# Patient Record
Sex: Male | Born: 1955 | Race: Black or African American | Hispanic: No | Marital: Single | Smoking: Current every day smoker
Health system: Southern US, Community
[De-identification: ages and names within clinical notes are randomized; demographics above are authoritative.]

## PROBLEM LIST (undated history)

## (undated) DIAGNOSIS — C679 Malignant neoplasm of bladder, unspecified: Secondary | ICD-10-CM

## (undated) DIAGNOSIS — G8929 Other chronic pain: Secondary | ICD-10-CM

## (undated) DIAGNOSIS — K2950 Unspecified chronic gastritis without bleeding: Secondary | ICD-10-CM

## (undated) DIAGNOSIS — Z86010 Personal history of colonic polyps: Secondary | ICD-10-CM

## (undated) DIAGNOSIS — M1990 Unspecified osteoarthritis, unspecified site: Secondary | ICD-10-CM

## (undated) DIAGNOSIS — Z973 Presence of spectacles and contact lenses: Secondary | ICD-10-CM

## (undated) DIAGNOSIS — Z1589 Genetic susceptibility to other disease: Secondary | ICD-10-CM

## (undated) DIAGNOSIS — M545 Low back pain, unspecified: Secondary | ICD-10-CM

## (undated) DIAGNOSIS — E739 Lactose intolerance, unspecified: Secondary | ICD-10-CM

## (undated) DIAGNOSIS — I10 Essential (primary) hypertension: Secondary | ICD-10-CM

## (undated) DIAGNOSIS — R300 Dysuria: Secondary | ICD-10-CM

## (undated) DIAGNOSIS — Z85038 Personal history of other malignant neoplasm of large intestine: Secondary | ICD-10-CM

## (undated) DIAGNOSIS — Z860101 Personal history of adenomatous and serrated colon polyps: Secondary | ICD-10-CM

## (undated) DIAGNOSIS — R399 Unspecified symptoms and signs involving the genitourinary system: Secondary | ICD-10-CM

## (undated) DIAGNOSIS — K219 Gastro-esophageal reflux disease without esophagitis: Secondary | ICD-10-CM

## (undated) DIAGNOSIS — K5790 Diverticulosis of intestine, part unspecified, without perforation or abscess without bleeding: Secondary | ICD-10-CM

## (undated) DIAGNOSIS — R0602 Shortness of breath: Secondary | ICD-10-CM

## (undated) DIAGNOSIS — Z8551 Personal history of malignant neoplasm of bladder: Secondary | ICD-10-CM

## (undated) DIAGNOSIS — F419 Anxiety disorder, unspecified: Secondary | ICD-10-CM

## (undated) DIAGNOSIS — Z1509 Genetic susceptibility to other malignant neoplasm: Secondary | ICD-10-CM

## (undated) DIAGNOSIS — Z8719 Personal history of other diseases of the digestive system: Secondary | ICD-10-CM

## (undated) DIAGNOSIS — F329 Major depressive disorder, single episode, unspecified: Secondary | ICD-10-CM

## (undated) DIAGNOSIS — C189 Malignant neoplasm of colon, unspecified: Secondary | ICD-10-CM

## (undated) DIAGNOSIS — F32A Depression, unspecified: Secondary | ICD-10-CM

## (undated) DIAGNOSIS — J45909 Unspecified asthma, uncomplicated: Secondary | ICD-10-CM

## (undated) DIAGNOSIS — T7840XA Allergy, unspecified, initial encounter: Secondary | ICD-10-CM

## (undated) DIAGNOSIS — J449 Chronic obstructive pulmonary disease, unspecified: Secondary | ICD-10-CM

## (undated) HISTORY — DX: Diverticulosis of intestine, part unspecified, without perforation or abscess without bleeding: K57.90

## (undated) HISTORY — DX: Depression, unspecified: F32.A

## (undated) HISTORY — DX: Genetic susceptibility to other malignant neoplasm: Z15.09

## (undated) HISTORY — DX: Gastro-esophageal reflux disease without esophagitis: K21.9

## (undated) HISTORY — DX: Anxiety disorder, unspecified: F41.9

## (undated) HISTORY — DX: Malignant neoplasm of colon, unspecified: C18.9

## (undated) HISTORY — DX: Essential (primary) hypertension: I10

## (undated) HISTORY — PX: COLONOSCOPY WITH ESOPHAGOGASTRODUODENOSCOPY (EGD): SHX5779

## (undated) HISTORY — DX: Major depressive disorder, single episode, unspecified: F32.9

## (undated) HISTORY — DX: Chronic obstructive pulmonary disease, unspecified: J44.9

## (undated) HISTORY — DX: Genetic susceptibility to other disease: Z15.89

## (undated) HISTORY — DX: Allergy, unspecified, initial encounter: T78.40XA

## (undated) HISTORY — PX: TONSILLECTOMY: SUR1361

## (undated) HISTORY — PX: LUMBAR DISC SURGERY: SHX700

---

## 1997-07-16 ENCOUNTER — Encounter: Admission: RE | Admit: 1997-07-16 | Discharge: 1997-10-14 | Payer: Self-pay | Admitting: Neurosurgery

## 1997-09-26 ENCOUNTER — Ambulatory Visit (HOSPITAL_COMMUNITY): Admission: RE | Admit: 1997-09-26 | Discharge: 1997-09-26 | Payer: Self-pay | Admitting: Neurosurgery

## 1997-10-25 ENCOUNTER — Ambulatory Visit (HOSPITAL_COMMUNITY): Admission: RE | Admit: 1997-10-25 | Discharge: 1997-10-25 | Payer: Self-pay | Admitting: Neurosurgery

## 1997-12-03 ENCOUNTER — Ambulatory Visit (HOSPITAL_COMMUNITY): Admission: RE | Admit: 1997-12-03 | Discharge: 1997-12-03 | Payer: Self-pay | Admitting: Neurosurgery

## 1997-12-03 ENCOUNTER — Encounter: Payer: Self-pay | Admitting: Neurosurgery

## 1997-12-20 ENCOUNTER — Inpatient Hospital Stay (HOSPITAL_COMMUNITY): Admission: RE | Admit: 1997-12-20 | Discharge: 1997-12-26 | Payer: Self-pay | Admitting: Neurosurgery

## 1997-12-20 ENCOUNTER — Encounter: Payer: Self-pay | Admitting: Neurosurgery

## 1997-12-23 ENCOUNTER — Encounter: Payer: Self-pay | Admitting: Neurosurgery

## 1998-01-04 ENCOUNTER — Emergency Department (HOSPITAL_COMMUNITY): Admission: EM | Admit: 1998-01-04 | Discharge: 1998-01-04 | Payer: Self-pay | Admitting: Emergency Medicine

## 1998-01-21 ENCOUNTER — Encounter: Admission: RE | Admit: 1998-01-21 | Discharge: 1998-04-21 | Payer: Self-pay | Admitting: Neurosurgery

## 1998-01-22 ENCOUNTER — Emergency Department (HOSPITAL_COMMUNITY): Admission: EM | Admit: 1998-01-22 | Discharge: 1998-01-22 | Payer: Self-pay | Admitting: *Deleted

## 1998-05-03 ENCOUNTER — Emergency Department (HOSPITAL_COMMUNITY): Admission: EM | Admit: 1998-05-03 | Discharge: 1998-05-03 | Payer: Self-pay | Admitting: *Deleted

## 1998-05-03 ENCOUNTER — Encounter: Payer: Self-pay | Admitting: *Deleted

## 1998-05-29 ENCOUNTER — Encounter: Payer: Self-pay | Admitting: Neurosurgery

## 1998-05-29 ENCOUNTER — Ambulatory Visit (HOSPITAL_COMMUNITY): Admission: RE | Admit: 1998-05-29 | Discharge: 1998-05-29 | Payer: Self-pay | Admitting: Neurosurgery

## 1998-07-07 ENCOUNTER — Ambulatory Visit (HOSPITAL_COMMUNITY): Admission: RE | Admit: 1998-07-07 | Discharge: 1998-07-07 | Payer: Self-pay | Admitting: Neurosurgery

## 1998-07-07 ENCOUNTER — Encounter: Payer: Self-pay | Admitting: Neurosurgery

## 1998-07-09 ENCOUNTER — Encounter: Admission: RE | Admit: 1998-07-09 | Discharge: 1998-10-07 | Payer: Self-pay | Admitting: Neurosurgery

## 1998-07-21 ENCOUNTER — Encounter: Payer: Self-pay | Admitting: Neurosurgery

## 1998-07-21 ENCOUNTER — Ambulatory Visit (HOSPITAL_COMMUNITY): Admission: RE | Admit: 1998-07-21 | Discharge: 1998-07-21 | Payer: Self-pay | Admitting: Neurosurgery

## 1998-08-04 ENCOUNTER — Ambulatory Visit (HOSPITAL_COMMUNITY): Admission: RE | Admit: 1998-08-04 | Discharge: 1998-08-04 | Payer: Self-pay | Admitting: Neurosurgery

## 1998-08-04 ENCOUNTER — Encounter: Payer: Self-pay | Admitting: Neurosurgery

## 1998-10-07 ENCOUNTER — Encounter: Admission: RE | Admit: 1998-10-07 | Discharge: 1999-01-05 | Payer: Self-pay | Admitting: Neurosurgery

## 1998-12-25 ENCOUNTER — Encounter: Payer: Self-pay | Admitting: Emergency Medicine

## 1998-12-25 ENCOUNTER — Emergency Department (HOSPITAL_COMMUNITY): Admission: EM | Admit: 1998-12-25 | Discharge: 1998-12-25 | Payer: Self-pay | Admitting: Emergency Medicine

## 1999-03-26 ENCOUNTER — Emergency Department (HOSPITAL_COMMUNITY): Admission: EM | Admit: 1999-03-26 | Discharge: 1999-03-26 | Payer: Self-pay | Admitting: Emergency Medicine

## 1999-04-09 ENCOUNTER — Encounter: Payer: Self-pay | Admitting: Neurosurgery

## 1999-04-09 ENCOUNTER — Ambulatory Visit (HOSPITAL_COMMUNITY): Admission: RE | Admit: 1999-04-09 | Discharge: 1999-04-09 | Payer: Self-pay | Admitting: Neurosurgery

## 1999-04-18 ENCOUNTER — Emergency Department (HOSPITAL_COMMUNITY): Admission: EM | Admit: 1999-04-18 | Discharge: 1999-04-18 | Payer: Self-pay | Admitting: Emergency Medicine

## 1999-04-28 ENCOUNTER — Encounter: Payer: Self-pay | Admitting: Neurosurgery

## 1999-04-28 ENCOUNTER — Ambulatory Visit (HOSPITAL_COMMUNITY): Admission: RE | Admit: 1999-04-28 | Discharge: 1999-04-28 | Payer: Self-pay | Admitting: Neurosurgery

## 1999-04-30 ENCOUNTER — Emergency Department (HOSPITAL_COMMUNITY): Admission: EM | Admit: 1999-04-30 | Discharge: 1999-04-30 | Payer: Self-pay | Admitting: Emergency Medicine

## 1999-05-19 ENCOUNTER — Ambulatory Visit (HOSPITAL_COMMUNITY): Admission: RE | Admit: 1999-05-19 | Discharge: 1999-05-19 | Payer: Self-pay | Admitting: Neurosurgery

## 1999-05-19 ENCOUNTER — Encounter: Payer: Self-pay | Admitting: Neurosurgery

## 1999-06-02 ENCOUNTER — Ambulatory Visit: Admission: RE | Admit: 1999-06-02 | Discharge: 1999-06-02 | Payer: Self-pay | Admitting: Neurosurgery

## 1999-06-02 ENCOUNTER — Encounter: Payer: Self-pay | Admitting: Neurosurgery

## 1999-06-16 ENCOUNTER — Emergency Department (HOSPITAL_COMMUNITY): Admission: EM | Admit: 1999-06-16 | Discharge: 1999-06-16 | Payer: Self-pay | Admitting: Emergency Medicine

## 1999-06-22 ENCOUNTER — Emergency Department (HOSPITAL_COMMUNITY): Admission: EM | Admit: 1999-06-22 | Discharge: 1999-06-22 | Payer: Self-pay | Admitting: Emergency Medicine

## 1999-06-30 ENCOUNTER — Emergency Department (HOSPITAL_COMMUNITY): Admission: EM | Admit: 1999-06-30 | Discharge: 1999-06-30 | Payer: Self-pay | Admitting: Emergency Medicine

## 1999-07-17 ENCOUNTER — Emergency Department (HOSPITAL_COMMUNITY): Admission: EM | Admit: 1999-07-17 | Discharge: 1999-07-17 | Payer: Self-pay | Admitting: Emergency Medicine

## 1999-07-29 ENCOUNTER — Emergency Department (HOSPITAL_COMMUNITY): Admission: EM | Admit: 1999-07-29 | Discharge: 1999-07-29 | Payer: Self-pay | Admitting: Emergency Medicine

## 1999-08-31 ENCOUNTER — Emergency Department (HOSPITAL_COMMUNITY): Admission: EM | Admit: 1999-08-31 | Discharge: 1999-08-31 | Payer: Self-pay | Admitting: Emergency Medicine

## 2000-01-15 ENCOUNTER — Encounter: Payer: Self-pay | Admitting: Emergency Medicine

## 2000-01-15 ENCOUNTER — Emergency Department (HOSPITAL_COMMUNITY): Admission: EM | Admit: 2000-01-15 | Discharge: 2000-01-16 | Payer: Self-pay | Admitting: Emergency Medicine

## 2000-01-28 ENCOUNTER — Emergency Department (HOSPITAL_COMMUNITY): Admission: EM | Admit: 2000-01-28 | Discharge: 2000-01-28 | Payer: Self-pay | Admitting: Emergency Medicine

## 2000-01-28 ENCOUNTER — Encounter: Payer: Self-pay | Admitting: Emergency Medicine

## 2003-06-11 ENCOUNTER — Inpatient Hospital Stay (HOSPITAL_COMMUNITY): Admission: EM | Admit: 2003-06-11 | Discharge: 2003-06-14 | Payer: Self-pay | Admitting: Psychiatry

## 2003-06-15 ENCOUNTER — Inpatient Hospital Stay (HOSPITAL_COMMUNITY): Admission: EM | Admit: 2003-06-15 | Discharge: 2003-06-18 | Payer: Self-pay | Admitting: Psychiatry

## 2003-07-16 ENCOUNTER — Emergency Department (HOSPITAL_COMMUNITY): Admission: EM | Admit: 2003-07-16 | Discharge: 2003-07-17 | Payer: Self-pay | Admitting: Emergency Medicine

## 2003-07-17 ENCOUNTER — Emergency Department (HOSPITAL_COMMUNITY): Admission: EM | Admit: 2003-07-17 | Discharge: 2003-07-17 | Payer: Self-pay | Admitting: Emergency Medicine

## 2004-11-15 ENCOUNTER — Emergency Department (HOSPITAL_COMMUNITY): Admission: EM | Admit: 2004-11-15 | Discharge: 2004-11-15 | Payer: Self-pay | Admitting: Emergency Medicine

## 2004-12-22 ENCOUNTER — Emergency Department (HOSPITAL_COMMUNITY): Admission: EM | Admit: 2004-12-22 | Discharge: 2004-12-22 | Payer: Self-pay | Admitting: *Deleted

## 2011-04-13 HISTORY — PX: CHOLECYSTECTOMY: SHX55

## 2011-09-11 HISTORY — PX: COLON SURGERY: SHX602

## 2013-08-13 ENCOUNTER — Ambulatory Visit: Payer: Self-pay | Admitting: Family

## 2013-08-23 ENCOUNTER — Telehealth: Payer: Self-pay | Admitting: Internal Medicine

## 2013-08-23 NOTE — Telephone Encounter (Signed)
LEFT MESSAGE FOR PATIENT TO RETURN CALL TO SCHEDULE NP APPT.  °

## 2013-08-24 ENCOUNTER — Telehealth: Payer: Self-pay | Admitting: Internal Medicine

## 2013-08-24 NOTE — Telephone Encounter (Signed)
C/D 08/24/13 for appt. 08/28/13

## 2013-08-28 ENCOUNTER — Other Ambulatory Visit: Payer: Self-pay

## 2013-08-28 ENCOUNTER — Encounter: Payer: Self-pay | Admitting: Internal Medicine

## 2013-08-28 ENCOUNTER — Other Ambulatory Visit: Payer: PRIVATE HEALTH INSURANCE

## 2013-08-28 ENCOUNTER — Ambulatory Visit (HOSPITAL_BASED_OUTPATIENT_CLINIC_OR_DEPARTMENT_OTHER): Payer: Medicare (Managed Care)

## 2013-08-28 ENCOUNTER — Ambulatory Visit (HOSPITAL_BASED_OUTPATIENT_CLINIC_OR_DEPARTMENT_OTHER): Payer: Medicare (Managed Care) | Admitting: Internal Medicine

## 2013-08-28 ENCOUNTER — Telehealth: Payer: Self-pay | Admitting: Internal Medicine

## 2013-08-28 ENCOUNTER — Ambulatory Visit: Payer: PRIVATE HEALTH INSURANCE

## 2013-08-28 ENCOUNTER — Telehealth: Payer: Self-pay

## 2013-08-28 VITALS — BP 133/83 | HR 80 | Temp 98.3°F | Resp 18 | Ht 70.0 in | Wt 210.7 lb

## 2013-08-28 DIAGNOSIS — K219 Gastro-esophageal reflux disease without esophagitis: Secondary | ICD-10-CM

## 2013-08-28 DIAGNOSIS — Z85038 Personal history of other malignant neoplasm of large intestine: Secondary | ICD-10-CM | POA: Insufficient documentation

## 2013-08-28 DIAGNOSIS — C184 Malignant neoplasm of transverse colon: Secondary | ICD-10-CM

## 2013-08-28 DIAGNOSIS — G894 Chronic pain syndrome: Secondary | ICD-10-CM | POA: Insufficient documentation

## 2013-08-28 DIAGNOSIS — F32A Depression, unspecified: Secondary | ICD-10-CM

## 2013-08-28 DIAGNOSIS — R911 Solitary pulmonary nodule: Secondary | ICD-10-CM

## 2013-08-28 DIAGNOSIS — F17200 Nicotine dependence, unspecified, uncomplicated: Secondary | ICD-10-CM

## 2013-08-28 DIAGNOSIS — Z1509 Genetic susceptibility to other malignant neoplasm: Secondary | ICD-10-CM

## 2013-08-28 DIAGNOSIS — F329 Major depressive disorder, single episode, unspecified: Secondary | ICD-10-CM

## 2013-08-28 DIAGNOSIS — Z1589 Genetic susceptibility to other disease: Secondary | ICD-10-CM | POA: Insufficient documentation

## 2013-08-28 DIAGNOSIS — J449 Chronic obstructive pulmonary disease, unspecified: Secondary | ICD-10-CM | POA: Insufficient documentation

## 2013-08-28 DIAGNOSIS — N5089 Other specified disorders of the male genital organs: Secondary | ICD-10-CM

## 2013-08-28 DIAGNOSIS — K7689 Other specified diseases of liver: Secondary | ICD-10-CM

## 2013-08-28 DIAGNOSIS — N50811 Right testicular pain: Secondary | ICD-10-CM | POA: Insufficient documentation

## 2013-08-28 LAB — COMPREHENSIVE METABOLIC PANEL (CC13)
ALT: 31 U/L (ref 0–55)
AST: 27 U/L (ref 5–34)
Albumin: 3.9 g/dL (ref 3.5–5.0)
Alkaline Phosphatase: 59 U/L (ref 40–150)
Anion Gap: 10 mEq/L (ref 3–11)
BUN: 15.5 mg/dL (ref 7.0–26.0)
CO2: 22 mEq/L (ref 22–29)
Calcium: 8.9 mg/dL (ref 8.4–10.4)
Chloride: 108 mEq/L (ref 98–109)
Creatinine: 1.3 mg/dL (ref 0.7–1.3)
Glucose: 100 mg/dl (ref 70–140)
Potassium: 4.2 mEq/L (ref 3.5–5.1)
Sodium: 140 mEq/L (ref 136–145)
Total Bilirubin: 0.46 mg/dL (ref 0.20–1.20)
Total Protein: 6.7 g/dL (ref 6.4–8.3)

## 2013-08-28 LAB — CBC WITH DIFFERENTIAL/PLATELET
BASO%: 0.2 % (ref 0.0–2.0)
Basophils Absolute: 0 10*3/uL (ref 0.0–0.1)
EOS%: 4.4 % (ref 0.0–7.0)
Eosinophils Absolute: 0.2 10*3/uL (ref 0.0–0.5)
HCT: 43.1 % (ref 38.4–49.9)
HGB: 14.7 g/dL (ref 13.0–17.1)
LYMPH%: 52.4 % — ABNORMAL HIGH (ref 14.0–49.0)
MCH: 33.1 pg (ref 27.2–33.4)
MCHC: 34.1 g/dL (ref 32.0–36.0)
MCV: 97.1 fL (ref 79.3–98.0)
MONO#: 0.4 10*3/uL (ref 0.1–0.9)
MONO%: 10.5 % (ref 0.0–14.0)
NEUT#: 1.3 10*3/uL — ABNORMAL LOW (ref 1.5–6.5)
NEUT%: 32.5 % — ABNORMAL LOW (ref 39.0–75.0)
Platelets: 180 10*3/uL (ref 140–400)
RBC: 4.44 10*6/uL (ref 4.20–5.82)
RDW: 13 % (ref 11.0–14.6)
WBC: 4.1 10*3/uL (ref 4.0–10.3)
lymph#: 2.2 10*3/uL (ref 0.9–3.3)
nRBC: 0 % (ref 0–0)

## 2013-08-28 LAB — LACTATE DEHYDROGENASE (CC13): LDH: 180 U/L (ref 125–245)

## 2013-08-28 NOTE — Patient Instructions (Signed)
Colorectal Cancer Colorectal cancer is an abnormal growth of tissue (tumor) in the colon or rectum that is cancerous (malignant). Unlike noncancerous (benign) tumors, malignant tumors can spread to other parts of your body. The colon is the large bowel or large intestine. The rectum is the last several inches of the colon.  RISK FACTORS The exact cause of colorectal cancer is unknown. However, the following factors may increase your chances of getting colorectal cancer:   Age older than 24 years.   Abnormal growths (polyps) on the inner wall of the colon or rectum.   Diabetes.   African American race.   Family history of hereditary nonpolyposis colorectal cancer. This condition is caused by changes in the genes that are responsible for repairing mismatched DNA.   Personal history of cancer. A person who has already had colorectal cancer may develop it a second time. Also, women with a history of ovarian, uterine, or breast cancer are at a somewhat higher risk of developing colorectal cancer.  Certain hereditary conditions.  Eating a diet that is high in fat (especially animal fat) and low in fiber, fruits, and vegetables.  Sedentary lifestyle.  Inflammatory bowel disease, including ulcerative colitis and Crohn disease.   Smoking.   Excessive alcohol use.  SYMPTOMS Early colorectal cancer often does not cause symptoms. As the cancer grows, symptoms may include:   Changes in bowel habits.  Diarrhea.   Constipation.   Feeling like the bowel does not empty completely after a bowel movement.   Blood in the stool.   Stools that are narrower than usual.   Abdominal discomfort, pain, bloating, fullness, or cramps.  Frequent gas pain.   Unexplained weight loss.   Constant tiredness.   Nausea and vomiting.  DIAGNOSIS  Your health care provider will ask about your medical history. He or she may also perform a number of procedures, such as:   A physical  exam.  A digital rectal exam.  A fecal occult blood test.  A barium enema.  Blood tests.   X-rays.   Imaging tests, such as CT scans or MRIs.   Taking a tissue sample (biopsy) from your colon or rectum to look for cancer cells.   A sigmoidoscopy to view the inside of the last part of your colon.   A colonoscopy to view the inside of your entire colon.   An endorectal ultrasound to see how deep a rectal tumor has grown and whether the cancer has spread to lymph nodes or other nearby tissues.  Your cancer will be staged to determine its severity and extent. Staging is a careful attempt to find out the size of the tumor, whether the cancer has spread, and if so, to what parts of the body. You may need to have more tests to determine the stage of your cancer. The test results will help determine what treatment plan is best for you.   Stage 0 The cancer is found only in the innermost lining of the colon or rectum.   Stage I The cancer has grown into the inner wall of the colon or rectum. The cancer has not yet reached the outer wall of the colon.   Stage II The cancer extends more deeply into or through the wall of the colon or rectum. It may have invaded nearby tissue, but cancer cells have not spread to the lymph nodes.   Stage III The cancer has spread to nearby lymph nodes but not to other parts of the body.  Stage IV The cancer has spread to other parts of the body, such as the liver or lungs.  Your health care provider may tell you the detailed stage of your cancer, which includes both a number and a letter.  TREATMENT  Depending on the type and stage, colorectal cancer may be treated with surgery, radiation therapy, chemotherapy, targeted therapy, or radiofrequency ablation. Some people have a combination of these therapies. Surgery may be done to remove the polyps from your colon. In early stages, your health care provider may be able to do this during a  colonoscopy. In later stages, surgery may be done to remove part of your colon.  HOME CARE INSTRUCTIONS   Only take over-the-counter or prescription medicines for pain, discomfort, or fever as directed by your health care provider.   Maintain a healthy diet.   Consider joining a support group. This may help you learn to cope with the stress of having colorectal cancer.   Seek advice to help you manage treatment of side effects.   Keep all follow-up appointments as directed by your health care provider.   Inform your cancer specialist if you are admitted to the hospital.  SEEK MEDICAL CARE IF:  Your diarrhea or constipation does not go away.   Your bowel habits change.  You have increased abdominal pain.   You notice new fatigue or weakness.  You lose weight. Document Released: 03/29/2005 Document Revised: 11/29/2012 Document Reviewed: 09/21/2012 Exit[employer] Patient Information 2014 [employer].  

## 2013-08-28 NOTE — Telephone Encounter (Signed)
gv and printed appt sched and avs for pt for May adn July °

## 2013-08-28 NOTE — Progress Notes (Signed)
[facility] [phone]   [phone]  NEW PATIENT EVALUATION   Name: Todd [name] Date: 08/29/2013 MRN: [id number] DOB: [birth year]  PCP: No PCP Per Patient   REFERRING PHYSICIAN: Philis Fendt, MD  REASON FOR REFERRAL: History of Colon Cancer, lynch syndrome  HISTORY OF PRESENT ILLNESS:Todd [name] is a 58 y.o. male who is being referred by Todd [name] for follow up regarding his history of colon cancer.  Today, he is accompanied by his wife and son.   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  He underwent surgical resection of transverse colon in [place], [place] (Dr. Conni Slipper).  Tumor was staged as T3N0M0.  However, only six lymph nodes were identified in the surgical specimen.  The patient also has a strong family history of colon cancer and colon polyposis and underwent testing for Lynch syndrome.  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   He was offered adjuvant chemotherapy with 5-FU leucovorin after his surgery; however, he stopped chemotherapy after only two cycles because for adverse side effects.  The patient was seen by Todd [name] in October 2014.  At that time, he was complaining of non-specific abdominal pain and bloating.  A CT scan of chest, abdomen, and pelvis done in September 2014 showed two small lesions in the liver, which appeared to be unchanged compared with previous Ct scans done in 2013 in [place].  He was also noted to have two small lesions in the lung of undetermined etiology. These could not be identified on previous CT scans done because that area was not included in the CT scans. He had CT of abdomen and pelvis on 05/08/2011 with no evidence of metastatic disease in the abdomen or pelvis.  There was mild narrowing at apex  of redundant sigmoid colon, most likely normal variation.  There was 7 mm benign appearing lesion in the upper right lobe of liver. He also had an MRI of brain (05/07/2013) which demonstrated no evidence of brain metastasis.  CT of chest was without evidence of metastatic disease in the chest.  He did have have pulmonary emphysema in the upper lung zones.  There was a 7 mm benign cyst in the upper right lobe of liver.  Last colonoscopy was 01/19/2013 which demonstrated done at [facility] Delray Alt, MD.  Biopsy was taken with removal of one polyp.  He had labs done on 04/24/2013 which revealed a normal WBC, Hgb and plt.  His CEA was 1.2.   He was followed by Todd [name] of [facility] with last visit on 04/24/2013 ([place], [place]). He was also seen by Todd [name] of [facility] on 08/09/2013 to reestablish a PCP relation since moving forml [place].  Of note, he was also referred to [facility] for pain management.  He has chronic lower back pain and is on the following: Methadone 10 mg one tab every 12 hours as needed; oxycodone-acetaminophen 10-325 mg one tab daily; buproprion HCL 150 mg daily.  He reports being disabled for 12 years.  He reports having a back surgery in 1998, 2 in 1999 and the last one was in 2002.  He reports the L5-S1 ruptured secondary to injury and had subsequent collapse requiring fusion.   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  He moved back to [place] from [place] (he stayed there nearly one year) to help care for his elderly mother and father.  He report two brothers with colon cancer (age 52 at diagnosis, died at age 88); sister, (age 44) and brother (age 82).  He also reports maternal uncles and their children having multiple cancers.  His mother who is still living has had male cancers.  He has two sons.  His oldest son is in his early forties (he has spoken with his mother).  He has a son name Todd [name], age 81.  He has four daughters,  (ages 40--Todd [name], 36--Todd [name], 38--Todd [name], 33--Todd [name]).  Todd [name] and Todd [name] had colonoscopies and had removal of polyps.  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  He is unsure of his other children lynch syndrome status.   He also reports right lateral leg a knot that is sore to touch over the past 2 months.  He also notes one in his groin over the past 6 months.  He notes no increase in size recently.  He denies fevers or chills.  He notes weight lost of 12 lbs from 222 lbs in January to 210 lbs.  He denies difficulty swallowing or chewing. He denies alcohol use.  He smokes 3-4 cigarettes daily down from one pack per day.  He has smoked for over 30 years.    PAST MEDICAL HISTORY:  has a past medical history of Colon cancer; COPD (chronic obstructive pulmonary disease); GERD (gastroesophageal reflux disease); Depression; Chronic pain syndrome; and MLH1 gene mutation.     PAST SURGICAL HISTORY: Surgical resection of transverse colon in June 2013  CURRENT MEDICATIONS: has a current medication list which includes the following prescription(s): bupropion, escitalopram, esomeprazole, lidocaine, lidocaine, methadone, mometasone, and oxycodone-acetaminophen.   ALLERGIES: Vicodin and Milk-related compounds   SOCIAL HISTORY: He denies alcohol but reports 3-4 cigarettes daily down from one pack per day.  He smoked for over 30 years.   FAMILY HISTORY: (as detailed in HPI).  Patient has multiple family members with colon cancer.   LABORATORY DATA:  CBC    Component Value Date/Time   WBC 4.1 08/28/2013 1614   RBC 4.44 08/28/2013 1614   HGB 14.7 08/28/2013 1614   HCT 43.1 08/28/2013 1614   PLT 180 08/28/2013 1614   MCV 97.1 08/28/2013 1614   MCH 33.1 08/28/2013 1614   MCHC 34.1 08/28/2013 1614   RDW 13.0 08/28/2013 1614   LYMPHSABS 2.2 08/28/2013 1614   MONOABS 0.4 08/28/2013 1614   EOSABS 0.2 08/28/2013 1614   BASOSABS 0.0 08/28/2013 1614   CMP     Component Value Date/Time   NA 140 08/28/2013  1614   K 4.2 08/28/2013 1614   CO2 22 08/28/2013 1614   GLUCOSE 100 08/28/2013 1614   BUN 15.5 08/28/2013 1614   CREATININE 1.3 08/28/2013 1614   CALCIUM 8.9 08/28/2013 1614   PROT 6.7 08/28/2013 1614   ALBUMIN 3.9 08/28/2013 1614   AST 27 08/28/2013 1614   ALT 31 08/28/2013 1614   ALKPHOS 59 08/28/2013 1614   BILITOT 0.46 08/28/2013 1614   Results for CHASEN, MENDELL (MRN [id number]) as of 08/29/2013 11:17  Ref. Range 08/28/2013 16:14  LDH Latest Range: 125-245 U/L 180     RADIOGRAPHY: No results found.     REVIEW OF SYSTEMS:  Constitutional: Denies fevers, chills or abnormal weight loss Eyes: Denies blurriness of vision Ears, nose, mouth, throat, and face: Denies mucositis  or sore throat Respiratory: Denies cough, dyspnea or wheezes Cardiovascular: Denies palpitation, chest discomfort or lower extremity swelling Gastrointestinal:  Denies nausea, heartburn or change in bowel habits Skin: Denies abnormal skin rashes Lymphatics: Denies new lymphadenopathy or easy bruising Neurological:Denies numbness, tingling or new weaknesses Behavioral/Psych: Mood is stable, no new changes  All other systems were reviewed with the patient and are negative.  PHYSICAL EXAM:  height is $RemoveB'5\' 10"'pVtNpzSQ$  (1.778 m) and weight is 210 lb 11.2 oz (95.573 kg). His oral temperature is 98.3 F (36.8 C). His blood pressure is 133/83 and his pulse is 80. His respiration is 18 and oxygen saturation is 98%.    GENERAL:alert, no distress and comfortable; well developed and well nourished.  SKIN: skin color, texture, turgor are normal, no rashes or significant lesions; 2-3 cm soft swelling near the lateral surface of right knee non-TTP.  EYES: normal, Conjunctiva are pink and non-injected, sclera clear OROPHARYNX:no exudate, no erythema and lips, buccal mucosa, and tongue normal; poor dentition.  NECK: supple, thyroid normal size, non-tender, without nodularity LYMPH:  no palpable lymphadenopathy in the cervical, axillary or  inguinal LUNGS: clear to auscultation and percussion with normal breathing effort HEART: regular rate & rhythm and no murmurs and no lower extremity edema ABDOMEN:abdomen soft, non-tender and normal bowel sounds; mild line scar well healed. Testicular exam: Negative for masses.  Positive scrotal tenderness.  No associated groin adenopathy.  Musculoskeletal:no cyanosis of digits and no clubbing  NEURO: alert & oriented x 3 with fluent speech, no focal motor/sensory deficits    PATHOLOGY:  Colon cancer Location: Transverse colon Date of Diagnosis: 09/2011 TNM staging: T3, N0, M0, staging pathologic. Node positive disease: Number of lymph nodes removed: 6, Number of positive lymph nodes: 0.  Stage at diagnosis: IIA  Histology: adenocarcinoma.  Histologic grade: Grade: 1. KRAS gene: unknown. BRAF gene: unknown. NRAS gene: unknown. Microsatellite instability (MSI): unknown. EGFR expression: unknown. Lynch syndrome testing: MLH1: prsent, MSH2: absent, MSH6:absenst, PMS2: absent.    IMPRESSION: JEAN SKOW is a 58 y.o. male with a history of   PLAN:  1.  Colon Cancer, Stage IIA.  --He completed 2 cycles of adjuvant chemotherapy following resection.  He is now on active surveillance with his last scans done January of 2015.  He had CT of chest/abdomen/pelvis and MRI of the brain.  All were negative of metastatic disease.   He does have a 7 mm lung nodule as noted below which was felt to be benign.  We recommended to continue follow up with labs and CEA and doctor's visits every 3-6 months for first 2 years then 6- 12 months for years 3-5 per NCCN guidelines.  We also recommended at minimal annual scans for the first 5 years.  We will consider scanning based on clinical symptoms as well and in consideration of his family and genetic history as noted in #2.   His last colonoscopy was 01/19/2013 which demonstrated done at [facility] Delray Alt, MD.  Biopsy was taken with removal of  one polyp.  He had labs done on 04/24/2013 which revealed a normal WBC, Hgb and plt.  His CEA was 1.2.   2. Lynch syndrome.  --He has a strong family history of colon cancer and colon polyposis and underwent testing for Lynch syndrome.  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  He will follow up with his GI for colonoscopy as warranted by GI.  3. Left scrotal swelling.  --We will obtain an ultrasound of his testes.  Likely inflammation of epidiymis.   4.  7 mm benign cyst in the upper right lobe of liver.   5. Pulmonary emphysema/Tobacco abuse.  --Patient counseled to stop smoking but declined tobacco cessation classes.   6. Follow up. --We recommend that he follow up in 2 months with CBC, Chemistries.    All questions were answered. The patient knows to call the clinic with any problems, questions or concerns. We can certainly see the patient much sooner if necessary.  I spent 40 minutes counseling the patient face to face. The total time spent in the appointment was 60 minutes.    Concha Norway, MD 08/29/2013 11:25 AM

## 2013-08-28 NOTE — Progress Notes (Signed)
Checked in new patient. He just moved here so no PCP at this time. He wanted copay to be billed. He has not been out of the country.

## 2013-08-29 ENCOUNTER — Ambulatory Visit (HOSPITAL_COMMUNITY): Admission: RE | Admit: 2013-08-29 | Payer: 59 | Source: Ambulatory Visit

## 2013-08-29 ENCOUNTER — Encounter: Payer: Self-pay | Admitting: Internal Medicine

## 2013-08-29 LAB — TSH CHCC: TSH: 1.139 m(IU)/L (ref 0.320–4.118)

## 2013-08-29 LAB — CEA: CEA: 1.5 ng/mL (ref 0.0–5.0)

## 2013-08-30 ENCOUNTER — Ambulatory Visit (HOSPITAL_COMMUNITY)
Admission: RE | Admit: 2013-08-30 | Discharge: 2013-08-30 | Disposition: A | Discharge status: Home / Self Care | Payer: Medicare (Managed Care) | Source: Ambulatory Visit | Attending: Internal Medicine | Admitting: Internal Medicine

## 2013-08-30 ENCOUNTER — Telehealth: Payer: Self-pay | Admitting: Internal Medicine

## 2013-08-30 DIAGNOSIS — N508 Other specified disorders of male genital organs: Secondary | ICD-10-CM | POA: Insufficient documentation

## 2013-08-30 DIAGNOSIS — N50811 Right testicular pain: Secondary | ICD-10-CM

## 2013-08-30 MED ORDER — DOXYCYCLINE HYCLATE 100 MG PO TABS: 100.0000 mg | ORAL_TABLET | Freq: Two times a day (BID) | ORAL | Status: DC

## 2013-08-30 NOTE — Telephone Encounter (Signed)
error 

## 2013-08-30 NOTE — Telephone Encounter (Signed)
Patient ultrasound was concerning for epididymitis.  We will start doxycyline 100 mg bid for seven days.  Patient also instructed to do ibuprofen prn for next 3 days.  If persists, he was instructed to call us.  We did not do levaquin because of its interaction with methadone.

## 2013-09-17 ENCOUNTER — Other Ambulatory Visit: Payer: Self-pay | Admitting: Medical Oncology

## 2013-09-17 ENCOUNTER — Other Ambulatory Visit: Payer: Self-pay | Admitting: Internal Medicine

## 2013-09-17 ENCOUNTER — Telehealth: Payer: Self-pay | Admitting: Internal Medicine

## 2013-09-17 ENCOUNTER — Telehealth: Payer: Self-pay | Admitting: *Deleted

## 2013-09-17 DIAGNOSIS — Z85038 Personal history of other malignant neoplasm of large intestine: Secondary | ICD-10-CM

## 2013-09-17 DIAGNOSIS — R39198 Other difficulties with micturition: Secondary | ICD-10-CM

## 2013-09-17 DIAGNOSIS — N50811 Right testicular pain: Secondary | ICD-10-CM

## 2013-09-17 NOTE — Telephone Encounter (Signed)
   Provider input needed: Move f/u appt sooner, non specific stomach changes     Reason for call: Reports "Feeling same symptoms as with colon cancer, Lynch syndrome"  Constitutional: positive for sweats Gastrointestinal: positive for diarrhea and Indigestion   ALLERGIES:  is allergic to vicodin and milk-related compounds.  Patient last received chemotherapy/ treatment on 2 cycles of neoadjuvant 5FU, LV   Patient was last seen in the office on 08-28-2013                                                                                             Next appt is 11-03-2013  Is patient having fevers greater than 100.5?  no, "I have started sweating continually but do not think I have a fever."     Is patient having uncontrolled pain, or new pain? yes, "My stomach hurts with a lot of gas and indigestion.  I noticed the past 4 days the percocet doesn't help my pain."     Is patient having new back pain that changes with position (worsens or eases when laying down?)  "I have a history of four back surgeries and just stopped paying attention to my back pain."   Is patient able to eat and drink? yes    Is patient able to pass stool without difficulty?   "yes, I have one loose diarrhea stool on average but now I have 2-3."      Is patient having uncontrolled nausea?  no    patient calls 09/17/2013 with complaint of  Constitutional: positive for sweats Gastrointestinal: positive for diarrhea and possible change in bowel habits, indigestion stomach pain. non-specific complaints "pain constantly, same symptoms I was having when diagnosed with colon cancer, I just know somethings not right and need to be seen before July.with my history and Dr. Juliann Mule wanted my right scrotum evaluated and they did the left.  I think my cancer is back in my stomach."     Summary Based on the above information advised patient to  Await return call to [phone] as this nurse will notify Dr. Juliann Mule of this  request.   Cherylynn Ridges  09/17/2013, 1:34 PM   Background Info  Todd [name]   DOB: November 24, 1955   MR#: [id number]   CSN#   [id number] 09/17/2013

## 2013-09-18 ENCOUNTER — Encounter: Payer: Self-pay | Admitting: Medical Oncology

## 2013-09-18 NOTE — Progress Notes (Signed)
Todd [name] cared called to inform Dr. Juliann [name] pt may not get his CT done this week due to insurance. Pt was living in [place] and has moved to [place]. He has not contacted his insurance company and he is still in the [employer] work. Once the patient contacts [employer] and informs them he has moved they will move him to a local net work. Per Dr. Juliann [name] if he does not get the CT before Monday we will need to reschedule his visit. I asked [name] to let me know if she does not get approval. She voiced understanding.

## 2013-09-19 ENCOUNTER — Emergency Department (HOSPITAL_COMMUNITY)
Admission: EM | Admit: 2013-09-19 | Discharge: 2013-09-19 | Disposition: A | Discharge status: Home / Self Care | Payer: Medicare (Managed Care) | Attending: Emergency Medicine | Admitting: Emergency Medicine

## 2013-09-19 ENCOUNTER — Emergency Department (HOSPITAL_COMMUNITY): Payer: Medicare (Managed Care)

## 2013-09-19 ENCOUNTER — Encounter (HOSPITAL_COMMUNITY): Payer: Self-pay | Admitting: Emergency Medicine

## 2013-09-19 DIAGNOSIS — Z79899 Other long term (current) drug therapy: Secondary | ICD-10-CM | POA: Diagnosis not present

## 2013-09-19 DIAGNOSIS — J4489 Other specified chronic obstructive pulmonary disease: Secondary | ICD-10-CM | POA: Diagnosis not present

## 2013-09-19 DIAGNOSIS — Z792 Long term (current) use of antibiotics: Secondary | ICD-10-CM | POA: Insufficient documentation

## 2013-09-19 DIAGNOSIS — L089 Local infection of the skin and subcutaneous tissue, unspecified: Secondary | ICD-10-CM | POA: Diagnosis not present

## 2013-09-19 DIAGNOSIS — K219 Gastro-esophageal reflux disease without esophagitis: Secondary | ICD-10-CM | POA: Diagnosis not present

## 2013-09-19 DIAGNOSIS — J449 Chronic obstructive pulmonary disease, unspecified: Secondary | ICD-10-CM | POA: Diagnosis not present

## 2013-09-19 DIAGNOSIS — Z85038 Personal history of other malignant neoplasm of large intestine: Secondary | ICD-10-CM | POA: Insufficient documentation

## 2013-09-19 DIAGNOSIS — F17200 Nicotine dependence, unspecified, uncomplicated: Secondary | ICD-10-CM | POA: Insufficient documentation

## 2013-09-19 DIAGNOSIS — F3289 Other specified depressive episodes: Secondary | ICD-10-CM | POA: Insufficient documentation

## 2013-09-19 DIAGNOSIS — F329 Major depressive disorder, single episode, unspecified: Secondary | ICD-10-CM | POA: Diagnosis not present

## 2013-09-19 DIAGNOSIS — IMO0002 Reserved for concepts with insufficient information to code with codable children: Secondary | ICD-10-CM | POA: Insufficient documentation

## 2013-09-19 LAB — CBC WITH DIFFERENTIAL/PLATELET
Basophils Absolute: 0 10*3/uL (ref 0.0–0.1)
Basophils Relative: 0 % (ref 0–1)
Eosinophils Absolute: 0.1 10*3/uL (ref 0.0–0.7)
Eosinophils Relative: 3 % (ref 0–5)
HCT: 45.8 % (ref 39.0–52.0)
Hemoglobin: 15.7 g/dL (ref 13.0–17.0)
Lymphocytes Relative: 40 % (ref 12–46)
Lymphs Abs: 1.9 10*3/uL (ref 0.7–4.0)
MCH: 33.5 pg (ref 26.0–34.0)
MCHC: 34.3 g/dL (ref 30.0–36.0)
MCV: 97.9 fL (ref 78.0–100.0)
Monocytes Absolute: 0.5 10*3/uL (ref 0.1–1.0)
Monocytes Relative: 11 % (ref 3–12)
Neutro Abs: 2.2 10*3/uL (ref 1.7–7.7)
Neutrophils Relative %: 46 % (ref 43–77)
Platelets: 232 10*3/uL (ref 150–400)
RBC: 4.68 MIL/uL (ref 4.22–5.81)
RDW: 12.9 % (ref 11.5–15.5)
WBC: 4.7 10*3/uL (ref 4.0–10.5)

## 2013-09-19 LAB — URINALYSIS, ROUTINE W REFLEX MICROSCOPIC
Bilirubin Urine: NEGATIVE
Glucose, UA: NEGATIVE mg/dL
Hgb urine dipstick: NEGATIVE
Ketones, ur: NEGATIVE mg/dL
Leukocytes, UA: NEGATIVE
Nitrite: NEGATIVE
Protein, ur: NEGATIVE mg/dL
Specific Gravity, Urine: 1.021 (ref 1.005–1.030)
Urobilinogen, UA: 0.2 mg/dL (ref 0.0–1.0)
pH: 5 (ref 5.0–8.0)

## 2013-09-19 LAB — BASIC METABOLIC PANEL
BUN: 12 mg/dL (ref 6–23)
CO2: 25 mEq/L (ref 19–32)
Calcium: 9.5 mg/dL (ref 8.4–10.5)
Chloride: 104 mEq/L (ref 96–112)
Creatinine, Ser: 1.27 mg/dL (ref 0.50–1.35)
GFR calc Af Amer: 71 mL/min — ABNORMAL LOW (ref >90)
GFR calc non Af Amer: 61 mL/min — ABNORMAL LOW (ref >90)
Glucose, Bld: 99 mg/dL (ref 70–99)
Potassium: 4.4 mEq/L (ref 3.7–5.3)
Sodium: 142 mEq/L (ref 137–147)

## 2013-09-19 LAB — CBG MONITORING, ED: Glucose-Capillary: 102 mg/dL — ABNORMAL HIGH (ref 70–99)

## 2013-09-19 LAB — LACTIC ACID, PLASMA: Lactic Acid, Venous: 1.5 mmol/L (ref 0.5–2.2)

## 2013-09-19 MED ORDER — CLINDAMYCIN PHOSPHATE 900 MG/50ML IV SOLN
900.0000 mg | Freq: Once | INTRAVENOUS | Status: AC
  Administered 2013-09-19: 900 mg via INTRAVENOUS
  Filled 2013-09-19: qty 50

## 2013-09-19 MED ORDER — ONDANSETRON HCL 4 MG/2ML IJ SOLN
4.0000 mg | Freq: Once | INTRAMUSCULAR | Status: AC
  Administered 2013-09-19: 4 mg via INTRAVENOUS
  Filled 2013-09-19: qty 2

## 2013-09-19 MED ORDER — CLINDAMYCIN HCL 150 MG PO CAPS: ORAL_CAPSULE | ORAL | Status: DC

## 2013-09-19 MED ORDER — MORPHINE SULFATE 4 MG/ML IJ SOLN
6.0000 mg | Freq: Once | INTRAMUSCULAR | Status: AC
  Administered 2013-09-19: 6 mg via INTRAVENOUS
  Filled 2013-09-19: qty 2

## 2013-09-19 MED ORDER — OXYCODONE-ACETAMINOPHEN 10-325 MG PO TABS: 1.0000 | ORAL_TABLET | Freq: Four times a day (QID) | ORAL | Status: DC | PRN

## 2013-09-19 MED ORDER — SODIUM CHLORIDE 0.9 % IV BOLUS (SEPSIS)
1000.0000 mL | Freq: Once | INTRAVENOUS | Status: AC
  Administered 2013-09-19: 1000 mL via INTRAVENOUS

## 2013-09-19 NOTE — ED Notes (Signed)
Meal tray ordered for patient with permission from [place]

## 2013-09-19 NOTE — ED Notes (Signed)
Pt transported to Xray. 

## 2013-09-19 NOTE — Discharge Instructions (Signed)
Return here as needed.  Followup with your primary care Dr. keep the area clean and dry. You can soak it in warm water for dry the area very well

## 2013-09-19 NOTE — ED Notes (Signed)
He states he thinks something bit his foot last week. He has a red painful area between his R 4th and 5th toes streaking up his foot. He states he aches all over

## 2013-09-19 NOTE — ED Provider Notes (Signed)
CSN: [id number]     Arrival date & time 09/19/13  1004 History   First MD Initiated Contact with Patient 09/19/13 1201     Chief Complaint  Patient presents with  . Wound Infection     (Consider location/radiation/quality/duration/timing/severity/associated sxs/prior Treatment) HPI Patient presents to the emergency department with pain and a swelling between his fourth and fifth toes on the right foot.  The patient, states, that he noticed that the skin between his fourth and fifth toes began to peel and he had swelling to the area.  That was painful with increased warmth.  The patient, states that he's been trying to keep the area clean and dry.  The patient denies chest pain, shortness of breath, fever, weakness, dizziness, back pain, neck pain, dysuria, nausea, vomiting, diarrhea, decreased appetite, increased urination, blurred vision, headache, rash, or syncope.  Patient, states nothing seems to make his condition, better, but palpation makes the pain, worse Past Medical History  Diagnosis Date  . Colon cancer   . COPD (chronic obstructive pulmonary disease)   . GERD (gastroesophageal reflux disease)   . Depression   . Chronic pain syndrome   . MLH1 gene mutation    Past Surgical History  Procedure Laterality Date  . Surgical resection of transverse colon     History reviewed. No pertinent family history. History  Substance Use Topics  . Smoking status: Current Every Day Smoker  . Smokeless tobacco: Not on file  . Alcohol Use: Not on file    Review of Systems  All other systems negative except as documented in the HPI. All pertinent positives and negatives as reviewed in the HPI.  Allergies  Vicodin and Milk-related compounds  Home Medications   Prior to Admission medications   Medication Sig Start Date End Date Taking? Authorizing Provider  buPROPion (WELLBUTRIN SR) 150 MG 12 hr tablet Take 150 mg by mouth daily.   Yes Historical Provider, MD  doxycycline  (VIBRA-TABS) 100 MG tablet Take 1 tablet (100 mg total) by mouth 2 (two) times daily. 08/30/13  Yes Concha Norway, MD  escitalopram (LEXAPRO) 20 MG tablet Take 20 mg by mouth daily.   Yes Historical Provider, MD  esomeprazole (NEXIUM) 40 MG capsule Take 40 mg by mouth 2 (two) times daily before a meal.   Yes Historical Provider, MD  lidocaine (LIDODERM) 5 % Place 1 patch onto the skin daily. Remove & Discard patch within 12 hours or as directed by MD   Yes Historical Provider, MD  lidocaine (LMX) 4 % cream Apply 1 application topically daily as needed. Low back   Yes Historical Provider, MD  methadone (DOLOPHINE) 10 MG tablet Take 10 mg by mouth every 12 (twelve) hours.   Yes Historical Provider, MD  mometasone (NASONEX) 50 MCG/ACT nasal spray Place 2 sprays into the nose daily as needed (congestion).    Yes Historical Provider, MD  oxyCODONE-acetaminophen (PERCOCET) 10-325 MG per tablet Take 1 tablet by mouth 2 (two) times daily as needed for pain.   Yes Historical Provider, MD   BP 136/82  Pulse 84  Temp(Src) 98.2 F (36.8 C) (Oral)  Resp 19  Wt 200 lb (90.719 kg)  SpO2 96% Physical Exam  Nursing note and vitals reviewed. Constitutional: He appears well-developed and well-nourished. No distress.  HENT:  Head: Normocephalic and atraumatic.  Cardiovascular: Normal rate, regular rhythm and normal heart sounds.  Exam reveals no gallop and no friction rub.   No murmur heard. Pulmonary/Chest: Effort normal and breath sounds  normal.  Musculoskeletal:       Right foot: He exhibits tenderness and swelling. He exhibits normal range of motion, no bony tenderness, normal capillary refill, no crepitus, no deformity and no laceration.       Feet:  Skin: Skin is warm and dry. No rash noted.    ED Course  Procedures (including critical care time) Labs Review Labs Reviewed  BASIC METABOLIC PANEL - Abnormal; Notable for the following:    GFR calc non Af Amer 61 (*)    GFR calc Af Amer 71 (*)     All other components within normal limits  CBG MONITORING, ED - Abnormal; Notable for the following:    Glucose-Capillary 102 (*)    All other components within normal limits  LACTIC ACID, PLASMA  CBC WITH DIFFERENTIAL  URINALYSIS, ROUTINE W REFLEX MICROSCOPIC    Imaging Review Dg Foot Complete Right  09/19/2013   CLINICAL DATA:  Recent insect bite with pain  EXAM: RIGHT FOOT COMPLETE - 3+ VIEW  COMPARISON:  None.  FINDINGS: Frontal, oblique, and lateral views were obtained. There is no appreciable fracture or dislocation. There is a small erosion at the lateral aspect of the proximal portion of the first proximal phalanx. There is slight narrowing of the first MTP joint. Other joint spaces appear unremarkable. There is no erosive change. No radiopaque foreign body. No bony destruction.  IMPRESSION: Slight erosive change along the lateral aspect of the first proximal phalanx with mild narrowing of the first MTP joint. Given this erosive arthropathy, correlation with uric acid level may be advised. Other joint spaces appear intact. No bony destruction. No radiopaque foreign body. No fracture or dislocation.   Electronically Signed   By: Lowella Grip M.D.   On: 09/19/2013 15:45    Patient is given IV antibiotics here in the emergency department.  He'll be treated for what appears to be a skin infection between the fourth and fifth toes.  The patient is also advised to keep the area clean and dry.  Also advised to soak his foot in warm water 3 times a day.  Patient is advised return here for any worsening in his condition and also advised them to follow up with his primary care Dr. for further evaluation and recheck   Brent General, PA-C 09/21/13 2357

## 2013-09-19 NOTE — ED Notes (Signed)
Dr. McManus at bedside. 

## 2013-09-19 NOTE — ED Notes (Signed)
Pt returned from X-ray.  

## 2013-09-19 NOTE — ED Notes (Signed)
Pt comfortable with discharge and follow up instructions. Prescriptions x2. 

## 2013-09-21 ENCOUNTER — Telehealth: Payer: Self-pay | Admitting: Medical Oncology

## 2013-09-21 NOTE — Telephone Encounter (Signed)
Pt called asking why he has not been rescheduled for his scan. I explained that Benedetto Goad in managed cared informed us that his insurance is still being networked in [place]. He needs to call his company and let them know he has relocated to [facility]. He states he has only been here one month. He states that he is in terrible pain and needs something done now. He states he will call but he needs to be seen today because his pain is so bad. I asked him to go to the ER to be evaluated because Dr. Juliann Mule has not available appointments. He states he is going now.

## 2013-09-23 NOTE — ED Provider Notes (Signed)
Medical screening examination/treatment/procedure(s) were performed by non-physician practitioner and as supervising physician I was immediately available for consultation/collaboration.   EKG Interpretation None        Alfonzo Feller, DO 09/23/13 1342

## 2013-09-24 ENCOUNTER — Emergency Department (HOSPITAL_COMMUNITY)
Admission: EM | Admit: 2013-09-24 | Discharge: 2013-09-24 | Disposition: A | Discharge status: Home / Self Care | Payer: Medicare (Managed Care) | Attending: Emergency Medicine | Admitting: Emergency Medicine

## 2013-09-24 ENCOUNTER — Encounter (HOSPITAL_COMMUNITY): Payer: Self-pay | Admitting: Emergency Medicine

## 2013-09-24 ENCOUNTER — Emergency Department (HOSPITAL_COMMUNITY): Payer: Medicare (Managed Care)

## 2013-09-24 DIAGNOSIS — F17200 Nicotine dependence, unspecified, uncomplicated: Secondary | ICD-10-CM | POA: Insufficient documentation

## 2013-09-24 DIAGNOSIS — J449 Chronic obstructive pulmonary disease, unspecified: Secondary | ICD-10-CM | POA: Diagnosis not present

## 2013-09-24 DIAGNOSIS — IMO0002 Reserved for concepts with insufficient information to code with codable children: Secondary | ICD-10-CM | POA: Insufficient documentation

## 2013-09-24 DIAGNOSIS — R319 Hematuria, unspecified: Secondary | ICD-10-CM | POA: Insufficient documentation

## 2013-09-24 DIAGNOSIS — R197 Diarrhea, unspecified: Secondary | ICD-10-CM | POA: Insufficient documentation

## 2013-09-24 DIAGNOSIS — J4489 Other specified chronic obstructive pulmonary disease: Secondary | ICD-10-CM | POA: Diagnosis not present

## 2013-09-24 DIAGNOSIS — Z85038 Personal history of other malignant neoplasm of large intestine: Secondary | ICD-10-CM | POA: Insufficient documentation

## 2013-09-24 DIAGNOSIS — F3289 Other specified depressive episodes: Secondary | ICD-10-CM | POA: Diagnosis not present

## 2013-09-24 DIAGNOSIS — F329 Major depressive disorder, single episode, unspecified: Secondary | ICD-10-CM | POA: Diagnosis not present

## 2013-09-24 DIAGNOSIS — G894 Chronic pain syndrome: Secondary | ICD-10-CM | POA: Insufficient documentation

## 2013-09-24 DIAGNOSIS — K219 Gastro-esophageal reflux disease without esophagitis: Secondary | ICD-10-CM | POA: Insufficient documentation

## 2013-09-24 DIAGNOSIS — R110 Nausea: Secondary | ICD-10-CM | POA: Insufficient documentation

## 2013-09-24 DIAGNOSIS — Z79899 Other long term (current) drug therapy: Secondary | ICD-10-CM | POA: Insufficient documentation

## 2013-09-24 DIAGNOSIS — N3289 Other specified disorders of bladder: Secondary | ICD-10-CM

## 2013-09-24 DIAGNOSIS — R109 Unspecified abdominal pain: Secondary | ICD-10-CM | POA: Diagnosis present

## 2013-09-24 DIAGNOSIS — N2889 Other specified disorders of kidney and ureter: Secondary | ICD-10-CM | POA: Diagnosis not present

## 2013-09-24 LAB — URINE MICROSCOPIC-ADD ON

## 2013-09-24 LAB — COMPREHENSIVE METABOLIC PANEL
ALT: 19 U/L (ref 0–53)
AST: 15 U/L (ref 0–37)
Albumin: 4.1 g/dL (ref 3.5–5.2)
Alkaline Phosphatase: 67 U/L (ref 39–117)
BUN: 12 mg/dL (ref 6–23)
CO2: 21 mEq/L (ref 19–32)
Calcium: 9.3 mg/dL (ref 8.4–10.5)
Chloride: 102 mEq/L (ref 96–112)
Creatinine, Ser: 1.39 mg/dL — ABNORMAL HIGH (ref 0.50–1.35)
GFR calc Af Amer: 64 mL/min — ABNORMAL LOW (ref >90)
GFR calc non Af Amer: 55 mL/min — ABNORMAL LOW (ref >90)
Glucose, Bld: 131 mg/dL — ABNORMAL HIGH (ref 70–99)
Potassium: 3.9 mEq/L (ref 3.7–5.3)
Sodium: 139 mEq/L (ref 137–147)
Total Bilirubin: 0.6 mg/dL (ref 0.3–1.2)
Total Protein: 7.5 g/dL (ref 6.0–8.3)

## 2013-09-24 LAB — URINALYSIS, ROUTINE W REFLEX MICROSCOPIC
Bilirubin Urine: NEGATIVE
Glucose, UA: NEGATIVE mg/dL
Ketones, ur: NEGATIVE mg/dL
Leukocytes, UA: NEGATIVE
Nitrite: NEGATIVE
Protein, ur: NEGATIVE mg/dL
Specific Gravity, Urine: 1.022 (ref 1.005–1.030)
Urobilinogen, UA: 0.2 mg/dL (ref 0.0–1.0)
pH: 5.5 (ref 5.0–8.0)

## 2013-09-24 LAB — CBC WITH DIFFERENTIAL/PLATELET
Basophils Absolute: 0 10*3/uL (ref 0.0–0.1)
Basophils Relative: 0 % (ref 0–1)
Eosinophils Absolute: 0.1 10*3/uL (ref 0.0–0.7)
Eosinophils Relative: 2 % (ref 0–5)
HCT: 48.2 % (ref 39.0–52.0)
Hemoglobin: 16.6 g/dL (ref 13.0–17.0)
Lymphocytes Relative: 40 % (ref 12–46)
Lymphs Abs: 2.3 10*3/uL (ref 0.7–4.0)
MCH: 33.4 pg (ref 26.0–34.0)
MCHC: 34.4 g/dL (ref 30.0–36.0)
MCV: 97 fL (ref 78.0–100.0)
Monocytes Absolute: 0.4 10*3/uL (ref 0.1–1.0)
Monocytes Relative: 7 % (ref 3–12)
Neutro Abs: 2.9 10*3/uL (ref 1.7–7.7)
Neutrophils Relative %: 51 % (ref 43–77)
Platelets: 222 10*3/uL (ref 150–400)
RBC: 4.97 MIL/uL (ref 4.22–5.81)
RDW: 13.1 % (ref 11.5–15.5)
WBC: 5.7 10*3/uL (ref 4.0–10.5)

## 2013-09-24 LAB — POC OCCULT BLOOD, ED: Fecal Occult Bld: NEGATIVE

## 2013-09-24 LAB — LIPASE, BLOOD: Lipase: 34 U/L (ref 11–59)

## 2013-09-24 MED ORDER — IOHEXOL 300 MG/ML  SOLN
50.0000 mL | Freq: Once | INTRAMUSCULAR | Status: AC | PRN
  Administered 2013-09-24: 50 mL via ORAL

## 2013-09-24 MED ORDER — SODIUM CHLORIDE 0.9 % IV BOLUS (SEPSIS)
500.0000 mL | Freq: Once | INTRAVENOUS | Status: AC
  Administered 2013-09-24: 500 mL via INTRAVENOUS

## 2013-09-24 MED ORDER — IOHEXOL 300 MG/ML  SOLN
100.0000 mL | Freq: Once | INTRAMUSCULAR | Status: AC | PRN
  Administered 2013-09-24: 100 mL via INTRAVENOUS

## 2013-09-24 NOTE — ED Notes (Signed)
Pt with colon cancer in 2013.  Pt with abdominal pain for 2 weeks.  Pt concerned that cancer has returned.  Pt saw Dr. Minta Balsam 2 weeks ago.  Pt did not have abdominal pain at this time.  Pt has also been nauseated and diarrhea.

## 2013-09-24 NOTE — ED Provider Notes (Signed)
CSN: [id number]     Arrival date & time 09/24/13  1556 History   First MD Initiated Contact with Patient 09/24/13 1650     Chief Complaint  Patient presents with  . Abdominal Pain   HPI Comments: Patient presents to the [facility] ED with CC of RUQ abdominal pain x 1 month.  Patient states his abdominal pain is intermittent 7/10 pain which radiates to the midumbilicus.  Patient states that this pain feels like gas passing and has been bad enough to bring him to his knees.  He feels that this pain is similar to the abdominal pain that he had when they found colon cancer.  Patient states that his pain is associated with nausea, 25 lbs unintentional weight loss, and intermittent diarrhea.  Patient describes diarrhea as loose watery brown stools.  Patient is currently on Clindamycin for treatment of cellulitis of his right foot.  He was given a 14 day course.  Patient denies melena, hematochezia, vomiting, hematemesis, fever, chills, chest pain, and shortness of breath.  Patient has not tried relieving factors at this time.  He finds that he does have some aggravation with eating.  Patient does have a history of colon cancer which was treated with resection and chemotherapy by Dr. Juliann Mule in 2013.  Patient has been found to have Lynch syndrome in his family.  He is concerned about recurrence of his cancer at this time.  He would like to have a CT scan here in the ED.  His last BM was today and he admits that he has continued to be able to pass gas.    Patient is a 58 y.o. male presenting with abdominal pain.  Abdominal Pain Associated symptoms: diarrhea, fatigue and nausea   Associated symptoms: no chest pain, no chills, no constipation, no dysuria, no fever, no hematuria, no shortness of breath and no vomiting     Past Medical History  Diagnosis Date  . Colon cancer   . COPD (chronic obstructive pulmonary disease)   . GERD (gastroesophageal reflux disease)   . Depression   . Chronic pain syndrome   . MLH1  gene mutation    Past Surgical History  Procedure Laterality Date  . Surgical resection of transverse colon     History reviewed. No pertinent family history. History  Substance Use Topics  . Smoking status: Current Every Day Smoker  . Smokeless tobacco: Not on file  . Alcohol Use: Not on file    Review of Systems  Constitutional: Positive for fatigue and unexpected weight change. Negative for fever, chills, activity change and appetite change.  HENT: Negative for trouble swallowing.   Respiratory: Negative for chest tightness and shortness of breath.   Cardiovascular: Negative for chest pain and leg swelling.  Gastrointestinal: Positive for nausea, abdominal pain and diarrhea. Negative for vomiting, constipation, blood in stool, abdominal distention and rectal pain.  Genitourinary: Positive for frequency. Negative for dysuria, urgency, hematuria and difficulty urinating.  Hematological: Does not bruise/bleed easily.  All other systems reviewed and are negative.     Allergies  Vicodin and Milk-related compounds  Home Medications   Prior to Admission medications   Medication Sig Start Date End Date Taking? Authorizing Provider  buPROPion (WELLBUTRIN SR) 150 MG 12 hr tablet Take 150 mg by mouth daily.   Yes Historical Provider, MD  clindamycin (CLEOCIN) 150 MG capsule 3 PO TID for 10 days 09/19/13  Yes Resa Miner Lawyer, PA-C  escitalopram (LEXAPRO) 20 MG tablet Take 20 mg by  mouth daily.   Yes Historical Provider, MD  esomeprazole (NEXIUM) 40 MG capsule Take 40 mg by mouth 2 (two) times daily before a meal.   Yes Historical Provider, MD  lidocaine (LIDODERM) 5 % Place 1 patch onto the skin daily. Remove & Discard patch within 12 hours or as directed by MD   Yes Historical Provider, MD  lidocaine (LMX) 4 % cream Apply 1 application topically daily as needed. Low back   Yes Historical Provider, MD  methadone (DOLOPHINE) 10 MG tablet Take 10 mg by mouth every 12 (twelve) hours.    Yes Historical Provider, MD  mometasone (NASONEX) 50 MCG/ACT nasal spray Place 2 sprays into the nose daily as needed (congestion).    Yes Historical Provider, MD  oxyCODONE-acetaminophen (PERCOCET) 10-325 MG per tablet Take 1 tablet by mouth every 6 (six) hours as needed for pain. 09/19/13  Yes Christopher W Lawyer, PA-C   BP 139/81  Pulse 73  Temp(Src) 98.8 F (37.1 C) (Oral)  Resp 16  SpO2 96% Physical Exam  Nursing note and vitals reviewed. Constitutional: He is oriented to person, place, and time. He appears well-developed and well-nourished. No distress.  HENT:  Head: Normocephalic and atraumatic.  Mouth/Throat: Oropharynx is clear and moist.  Eyes: Conjunctivae are normal. Pupils are equal, round, and reactive to light. No scleral icterus.  Neck: Normal range of motion. Neck supple. No JVD present.  Cardiovascular: Normal rate, regular rhythm, normal heart sounds and intact distal pulses.  Exam reveals no gallop and no friction rub.   No murmur heard. Pulmonary/Chest: Effort normal and breath sounds normal. No respiratory distress. He has no wheezes. He has no rales. He exhibits no tenderness.  Abdominal: Soft. Bowel sounds are normal. There is tenderness in the right upper quadrant. There is no rigidity, no rebound, no guarding, no CVA tenderness, no tenderness at McBurney's point and negative Murphy's sign.  Genitourinary: Rectum normal and prostate normal. Guaiac negative stool.  Musculoskeletal: Normal range of motion.  Lymphadenopathy:    He has no cervical adenopathy.  Neurological: He is alert and oriented to person, place, and time.  Skin: Skin is warm and dry. He is not diaphoretic.  Psychiatric: He has a normal mood and affect. His behavior is normal. Judgment and thought content normal.    ED Course  Procedures (including critical care time) Labs Review Labs Reviewed  COMPREHENSIVE METABOLIC PANEL - Abnormal; Notable for the following:    Glucose, Bld 131 (*)     Creatinine, Ser 1.39 (*)    GFR calc non Af Amer 55 (*)    GFR calc Af Amer 64 (*)    All other components within normal limits  URINALYSIS, ROUTINE W REFLEX MICROSCOPIC - Abnormal; Notable for the following:    APPearance CLOUDY (*)    Hgb urine dipstick MODERATE (*)    All other components within normal limits  URINE MICROSCOPIC-ADD ON - Abnormal; Notable for the following:    Bacteria, UA FEW (*)    All other components within normal limits  CBC WITH DIFFERENTIAL  LIPASE, BLOOD  POC OCCULT BLOOD, ED    Imaging Review Ct Abdomen Pelvis W Contrast  09/24/2013   CLINICAL DATA:  58 year old male with abdominal and pelvic pain. History of colon cancer.  EXAM: CT ABDOMEN AND PELVIS WITH CONTRAST  TECHNIQUE: Multidetector CT imaging of the abdomen and pelvis was performed using the standard protocol following bolus administration of intravenous contrast.  CONTRAST:  145m OMNIPAQUE IOHEXOL 300 MG/ML  COMPARISON:  None.  FINDINGS: Mild intrahepatic biliary dilatation is identified with prominence of the CBD measuring 16 mm. The distal CBD is unremarkable and no obstructing causes identified. The patient is status post cholecystectomy.  The remainder of the liver is unremarkable.  The spleen, adrenal glands, pancreas, and kidneys are unremarkable.  There is no evidence of free fluid, enlarged lymph nodes or abdominal aortic aneurysm.  The bowel is unremarkable. There is no evidence of bowel obstruction, abscess or pneumoperitoneum.  There is a suggestion of a possible 6 mm mass along the left bladder wall and direct inspection is recommended.  No acute or suspicious bony abnormalities are present.  Surgical changes/ fusion at L5-S1 noted.  IMPRESSION: Mild intrahepatic biliary and CBD dilatation, greater than expected status post cholecystectomy. No obstructing cause identified and consider MRCP/ ERCP for further evaluation.  Possible 6 mm left bladder wall mass. Direct inspection is recommended to  exclude uroepithelial neoplasm.  No other significant abnormalities identified.   Electronically Signed   By: Hassan Rowan M.D.   On: 09/24/2013 20:16     EKG Interpretation None      MDM   Final diagnoses:  Hematuria  Bladder mass   Patient presents to the [facility] ED with RUQ abdominal pain x 1 month.  Patient has a PMH of colon cancer with family history of Lynch syndrome.  Have performed CBC, CMP, Lipase, and UA, and hemoccult.  CBC, CMP, and hemoccult were unremarkable.  Lipase is negative at this time.  UA shows moderate hemoglobin and mucous.  Have performed a CT scan of the abdomen and pelvis at this time.  CT scan shows 6 mm bladder mass at this time which may be the cause of the hematuria.  Patient has been notified of this mass.  Workup of the mass can be performed by Dr. Juliann Mule the patient's oncologist at this time.  Both the patient and Dr. Winfred Leeds are familiar with this plan and state agreement.     Kenard Gower, PA-C 09/24/13 2136

## 2013-09-24 NOTE — ED Notes (Signed)
Initial Contact - pt to RM3 from triage, with family, changed to hospital gown.  Pt reports hx of colon CA in 2013 "and i think it's back".  Pt reports seen at [place] [facility] but secondary to insurance issues "they told me to come to the ER".  Pt reports 8/10 abd pain x2 weeks, +nausea/diarrhea intermittently.  Skin PWD.  A+Ox4.  Abd s/nt/nd.  No active nausea/vomiting.  NAD.

## 2013-09-24 NOTE — ED Provider Notes (Signed)
Patient presents with right-sided abdominal pain x approximately 3 weeks. Pain is constant similar to colon cancers on the past. No fever no other associated symptoms. On exam no distress abdomen midline surgical scar. Nondistended. Tender at right side no guarding or rigidity  Orlie Dakin, MD 09/24/13 1839

## 2013-09-24 NOTE — ED Notes (Signed)
Pt has been given a urinal.

## 2013-09-24 NOTE — ED Notes (Signed)
Pt transported to CT ?

## 2013-09-24 NOTE — ED Notes (Signed)
Chaperoned rectal exam with EDPA

## 2013-09-24 NOTE — Discharge Instructions (Signed)
Cystography  This is a special x-ray in which the bladder is filled with a special contrast material (like a dye) to help show the bladder on an x-ray. The material does not allow the X-ray to go through, so if anything is pushing on the bladder, it will show an indentation in the bladder. This test looks for bladder and pelvic tumors. It can also show different types of cysts, or if there has been any damage to the bladder or the structures around it. It also measures reflux of urine from the bladder back up into the ureters. This is called ureterovesical reflux and is a cause of repeated or long standing urinary tract infections. PREPARATION FOR TEST  Clear liquids only on the morning of the test.  Insertion of a Foley catheter upon arrival if ordered by your caregiver. NORMAL FINDINGS Normal bladder structure and function. Ranges for normal findings may vary among different laboratories and hospitals. You should always check with your doctor after having lab work or other tests done to discuss the meaning of your test results and whether your values are considered within normal limits. ABNORMAL FINDINGS MAY BE FOUND IN THE PRESENCE OF:   Bladder tumor  Pelvic tumor  Hematoma  Bladder trauma  Bladder reflux MEANING OF TEST  Your caregiver will go over the test results with you and discuss the importance and meaning of your results, as well as treatment options and the need for additional tests if necessary. OBTAINING THE TEST RESULTS It is your responsibility to obtain your test results. Ask the lab or department performing the test when and how you will get your results. Document Released: 04/30/2004 Document Revised: 06/21/2011 Document Reviewed: 03/07/2008 [facility] Patient Information 2014 [place], [place].  Hematuria, Adult Hematuria is blood in your urine. It can be caused by a bladder infection, kidney infection, prostate infection, kidney stone, or cancer of your urinary tract.  Infections can usually be treated with medicine, and a kidney stone usually will pass through your urine. If neither of these is the cause of your hematuria, further workup to find out the reason may be needed. It is very important that you tell your health care provider about any blood you see in your urine, even if the blood stops without treatment or happens without causing pain. Blood in your urine that happens and then stops and then happens again can be a symptom of a very serious condition. Also, pain is not a symptom in the initial stages of many urinary cancers. HOME CARE INSTRUCTIONS   Drink lots of fluid, 3 4 quarts a day. If you have been diagnosed with an infection, cranberry juice is especially recommended, in addition to large amounts of water.  Avoid caffeine, tea, and carbonated beverages, because they tend to irritate the bladder.  Avoid alcohol because it may irritate the prostate.  Only take over-the-counter or prescription medicines for pain, discomfort, or fever as directed by your health care provider.  If you have been diagnosed with a kidney stone, follow your health care provider's instructions regarding straining your urine to catch the stone.  Empty your bladder often. Avoid holding urine for long periods of time.  After a bowel movement, women should cleanse front to back. Use each tissue only once.  Empty your bladder before and after sexual intercourse if you are a male. SEEK MEDICAL CARE IF: You develop back pain, fever, a feeling of sickness in your stomach (nausea), or vomiting or if your symptoms are not better  in 3 days. Return sooner if you are getting worse. SEEK IMMEDIATE MEDICAL CARE IF:   You have a persistent fever, with a temperature of 101.69F (38.8C) or greater.  You develop severe vomiting and are unable to keep the medicine down.  You develop severe back or abdominal pain despite taking your medicines.  You begin passing a large amount of  blood or clots in your urine.  You feel extremely weak or faint, or you pass out. MAKE SURE YOU:   Understand these instructions.  Will watch your condition.  Will get help right away if you are not doing well or get worse. Document Released: 03/29/2005 Document Revised: 01/17/2013 Document Reviewed: 11/27/2012 [facility] Patient Information 2014 [place].

## 2013-09-24 NOTE — Progress Notes (Signed)
  CARE MANAGEMENT ED NOTE 09/24/2013  Patient:  Todd [name], Todd [name]   Account Number:  [id number]  Date Initiated:  09/24/2013  Documentation initiated by:  Livia Snellen  Subjective/Objective Assessment:   Patient presents to Ed with right sided abdominal pian for three weeks     Subjective/Objective Assessment Detail:     Action/Plan:   Action/Plan Detail:   Anticipated DC Date:  09/24/2013     Status Recommendation to Physician:   Result of Recommendation:    Other ED [place]  Other  PCP issues    Choice offered to / List presented to:            Status of service:  Completed, signed off  ED Comments:   ED Comments Detail:  EDCM spoke to patient at bedside regarding pcp issues. Patient is from [place] with [employer].  EDCM instructed patient to call the phone number onthe back of his insurance card to help him find a physician  who is close to him and within network.  Patient verbalized understanding.  No further EDCM needs at this time.

## 2013-09-25 ENCOUNTER — Other Ambulatory Visit: Payer: Self-pay | Admitting: Internal Medicine

## 2013-09-25 ENCOUNTER — Telehealth: Payer: Self-pay | Admitting: Medical Oncology

## 2013-09-25 ENCOUNTER — Telehealth: Payer: Self-pay | Admitting: Internal Medicine

## 2013-09-25 DIAGNOSIS — Z1589 Genetic susceptibility to other disease: Secondary | ICD-10-CM

## 2013-09-25 DIAGNOSIS — N3289 Other specified disorders of bladder: Secondary | ICD-10-CM

## 2013-09-25 DIAGNOSIS — Z85038 Personal history of other malignant neoplasm of large intestine: Secondary | ICD-10-CM

## 2013-09-25 NOTE — Telephone Encounter (Signed)
, °

## 2013-09-25 NOTE — Telephone Encounter (Signed)
Pt called and states that he went tot he ER last evening and he was told to call Dr. Juliann Mule. He did have a CT and Dr. Juliann Mule reviewed the report. Per Dr. Juliann Mule he will need to be referred to Urology to be scoped and to have a biopsy. I informed him we will call him with an appt. He voiced understanding. He also asked if he can get copies of his records from [place]. I explained he will need to come to medical records and sign a release of information. He also asked about his appointment tomorrow. I explained we can cancel and reshedule further out since his CT was negative regarding his colon.

## 2013-09-25 NOTE — Telephone Encounter (Signed)
I spoke to the patient by phone.  We reviewed his Ct of abdomen which was negative for evidence of metastatic disease but did demonstrate left bladder wall mass, 7 mm.  He was referred to urology for possible cystoscopy.  He also notes that his given his history, he usually obtains colonoscopy yearly.  I will also refer him to gastroenterology for further evaluation.  We requested that he bring his last colonoscopy report with him to his next visit.

## 2013-09-25 NOTE — ED Provider Notes (Signed)
Medical screening examination/treatment/procedure(s) were conducted as a shared visit with non-physician practitioner(s) and myself.  I personally evaluated the patient during the encounter.   EKG Interpretation None       Orlie Dakin, MD 09/25/13 4276

## 2013-09-26 ENCOUNTER — Telehealth: Payer: Self-pay | Admitting: Internal Medicine

## 2013-09-26 ENCOUNTER — Encounter: Payer: Self-pay | Admitting: Gastroenterology

## 2013-09-26 ENCOUNTER — Other Ambulatory Visit: Payer: Self-pay | Admitting: Medical Oncology

## 2013-09-26 DIAGNOSIS — Z85038 Personal history of other malignant neoplasm of large intestine: Secondary | ICD-10-CM

## 2013-09-26 NOTE — Telephone Encounter (Signed)
Talked to pt gave him appt for GI on august 2015, pt aware of Urology, Cira Rue will call [facility] to get sooner appt, pt will get notes from [place] and fax to [facility]

## 2013-09-27 ENCOUNTER — Ambulatory Visit: Payer: 59

## 2013-10-10 ENCOUNTER — Ambulatory Visit: Payer: 59 | Admitting: Internal Medicine

## 2013-10-17 ENCOUNTER — Encounter (HOSPITAL_COMMUNITY): Payer: Self-pay | Admitting: Pharmacy Technician

## 2013-10-17 ENCOUNTER — Other Ambulatory Visit: Payer: Self-pay | Admitting: Urology

## 2013-10-19 ENCOUNTER — Other Ambulatory Visit (HOSPITAL_COMMUNITY): Payer: Self-pay | Admitting: Anesthesiology

## 2013-10-19 NOTE — Patient Instructions (Addendum)
20 Todd [name]  10/19/2013   Your procedure is scheduled on: Friday July 17th, 2015  Report to [facility] Main Entrance and follow signs to  [place] at 530 AM.  Call this number if you have problems the morning of surgery [phone]   Remember:  Do not eat food or drink liquids :After Midnight.     Take these medicines the morning of surgery with A SIP OF WATER: lexapro, nexium, nasonex, oxycodone if needed, wellbutrin                               You may not have any metal on your body including hair pins and piercings  Do not wear jewelry, make-up, lotions, powders, or deodorant.   Men may shave face and neck.  Do not bring valuables to the hospital. [place].  Contacts, dentures or bridgework may not be worn into surgery.   Patients discharged the day of surgery will not be allowed to drive home.  Name and phone number of your driver: Todd [name] [phone]  ________________________________________________________________________  [facility] - Preparing for Surgery Before surgery, you can play an important role.  Because skin is not sterile, your skin needs to be as free of germs as possible.  You can reduce the number of germs on your skin by washing with CHG (chlorahexidine gluconate) soap before surgery.  CHG is an antiseptic cleaner which kills germs and bonds with the skin to continue killing germs even after washing. Please DO NOT use if you have an allergy to CHG or antibacterial soaps.  If your skin becomes reddened/irritated stop using the CHG and inform your nurse when you arrive at Short Stay. Do not shave (including legs and underarms) for at least 48 hours prior to the first CHG shower.  You may shave your face/neck. Please follow these instructions carefully:  1.  Shower with CHG Soap the night before surgery and the  morning of Surgery.  2.  If you choose to wash your hair, wash your hair first as usual with your   normal  shampoo.  3.  After you shampoo, rinse your hair and body thoroughly to remove the  shampoo.                          4.  Use CHG as you would any other liquid soap.  You can apply chg directly  to the skin and wash                       Gently with a scrungie or clean washcloth.  5.  Apply the CHG Soap to your body ONLY FROM THE NECK DOWN.   Do not use on face/ open                           Wound or open sores. Avoid contact with eyes, ears mouth and genitals (private parts).                       Wash face,  Genitals (private parts) with your normal soap.             6.  Wash thoroughly, paying special attention to the area where your surgery  will be performed.  7.  Thoroughly rinse your  body with warm water from the neck down.  8.  DO NOT shower/wash with your normal soap after using and rinsing off  the CHG Soap.                9.  Pat yourself dry with a clean towel.            10.  Wear clean pajamas.            11.  Place clean sheets on your bed the night of your first shower and do not  sleep with pets. Day of Surgery : Do not apply any lotions/deodorants the morning of surgery.  Please wear clean clothes to the hospital/surgery center.  FAILURE TO FOLLOW THESE INSTRUCTIONS MAY RESULT IN THE CANCELLATION OF YOUR SURGERY PATIENT SIGNATURE_________________________________  NURSE SIGNATURE__________________________________  ________________________________________________________________________

## 2013-10-22 ENCOUNTER — Encounter (HOSPITAL_COMMUNITY)
Admission: RE | Admit: 2013-10-22 | Discharge: 2013-10-22 | Disposition: A | Discharge status: Home / Self Care | Payer: Medicare HMO | Source: Ambulatory Visit | Attending: Urology | Admitting: Urology

## 2013-10-22 ENCOUNTER — Encounter (INDEPENDENT_AMBULATORY_CARE_PROVIDER_SITE_OTHER): Payer: Self-pay

## 2013-10-22 ENCOUNTER — Ambulatory Visit (HOSPITAL_COMMUNITY)
Admission: RE | Admit: 2013-10-22 | Discharge: 2013-10-22 | Disposition: A | Discharge status: Home / Self Care | Payer: Medicare HMO | Source: Ambulatory Visit | Attending: Anesthesiology | Admitting: Anesthesiology

## 2013-10-22 ENCOUNTER — Encounter (HOSPITAL_COMMUNITY): Payer: Self-pay

## 2013-10-22 DIAGNOSIS — Z01810 Encounter for preprocedural cardiovascular examination: Secondary | ICD-10-CM | POA: Insufficient documentation

## 2013-10-22 DIAGNOSIS — Z01812 Encounter for preprocedural laboratory examination: Secondary | ICD-10-CM | POA: Insufficient documentation

## 2013-10-22 DIAGNOSIS — Z01818 Encounter for other preprocedural examination: Secondary | ICD-10-CM | POA: Insufficient documentation

## 2013-10-22 HISTORY — DX: Unspecified asthma, uncomplicated: J45.909

## 2013-10-22 HISTORY — DX: Unspecified osteoarthritis, unspecified site: M19.90

## 2013-10-22 LAB — BASIC METABOLIC PANEL
Anion gap: 13 (ref 5–15)
BUN: 11 mg/dL (ref 6–23)
CO2: 25 mEq/L (ref 19–32)
Calcium: 9.2 mg/dL (ref 8.4–10.5)
Chloride: 101 mEq/L (ref 96–112)
Creatinine, Ser: 1.46 mg/dL — ABNORMAL HIGH (ref 0.50–1.35)
GFR calc Af Amer: 60 mL/min — ABNORMAL LOW (ref >90)
GFR calc non Af Amer: 52 mL/min — ABNORMAL LOW (ref >90)
Glucose, Bld: 93 mg/dL (ref 70–99)
Potassium: 4.1 mEq/L (ref 3.7–5.3)
Sodium: 139 mEq/L (ref 137–147)

## 2013-10-22 LAB — CBC
HCT: 43.6 % (ref 39.0–52.0)
Hemoglobin: 14.7 g/dL (ref 13.0–17.0)
MCH: 32.5 pg (ref 26.0–34.0)
MCHC: 33.7 g/dL (ref 30.0–36.0)
MCV: 96.5 fL (ref 78.0–100.0)
Platelets: 244 10*3/uL (ref 150–400)
RBC: 4.52 MIL/uL (ref 4.22–5.81)
RDW: 12.6 % (ref 11.5–15.5)
WBC: 6.5 10*3/uL (ref 4.0–10.5)

## 2013-10-25 NOTE — Discharge Instructions (Signed)

## 2013-10-25 NOTE — H&P (Signed)
History of Present Illness Bladder mass - June 2015 - pt with a one month h/o RUQ pain. Patient has a history of colon cancer which was treated with transverse colectomy 6/13 in [place], [place] (Dr. Conni Slipper), stage [id number]. The patient also has a strong family history of colon cancer and colon polyposis and underwent testing for Lynch syndrome. 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 He was treated with adjuvant chemotherapy with 5-FU leucovorin; however, he stopped chemotherapy after only two cycles because for adverse side effects. The patient was seen by Dr. Leonie Man in October 2014. At that time, he was complaining of non-specific abdominal pain and bloating. He developed RUQ pain and went to ED June 2015. His UA showed 11-20 rbc, Cx negative, bun 12, cr 1.39 (gfr 64, stable). LFT, AP normal.   A CT A/P was obtained which revealed a 10 mm left bladder wall mass.   Other exposures - He has not had any exposure to industrial dyes or chemicals. He has been a smoker in the past smoking as much as one half a pack per day. He has decreased his smoking to 2-3 cigarettes per day but continues to smoke at this time.  He denies gross hematuria, dysuria or UTI.   LUTS - he has noted an increase in his nocturia 3-4 times but does not note significant daytime frequency. He also has experienced some suprapubic pressure-type sensation in the area of the bladder. In addition he has noted some occasional intermittency and hesitancy.   Past Medical History Problems  1. History of chronic obstructive lung disease (V12.69) 2. History of depression (V11.8) 3. History of esophageal reflux (V12.79) 4. History of gout (V12.29) 5. History of malignant neoplasm of colon (V10.05)  Surgical History Problems  1. History of Back Surgery 2. History of Colon Surgery 3. History of Gallbladder Surgery  Current Meds 1. No Reported Medications Recorded  Allergies Medication  1. No Known Drug  Allergies  Family History Problems  1. Family history of colon cancer (V16.0) : Sibling 2. Family history of congestive heart failure (V17.49) : Sister 3. Family history of malignant neoplasm (V16.9) : Mother 4. Family history of stroke (V17.1) : Father  Social History Problems    Alcohol use (V49.89)   Caffeine use (V49.89)   Divorced   Never a smoker   Occupation  Review of Systems Genitourinary, constitutional, skin, eye, otolaryngeal, hematologic/lymphatic, cardiovascular, pulmonary, endocrine, musculoskeletal, gastrointestinal, neurological and psychiatric system(s) were reviewed and pertinent findings if present are noted.  Genitourinary: nocturia, incontinence, difficulty starting the urinary stream, urinary stream starts and stops, hematuria, erectile dysfunction and penile pain.  Gastrointestinal: nausea and heartburn.  Constitutional: night sweats, feeling tired (fatigue) and recent weight loss.  Integumentary: skin rash/lesion and pruritus.  Respiratory: shortness of breath and cough.  Endocrine: polydipsia.  Musculoskeletal: back pain and joint pain.  Psychiatric: anxiety and depression.    Vitals Vital Signs  Height: 5 ft 11 in Weight: 200 lb  BMI Calculated: 27.89 BSA Calculated: 2.11 Blood Pressure: 131 / 82 Heart Rate: 74  Physical Exam Constitutional: Well nourished and well developed . No acute distress.  ENT:. The ears and nose are normal in appearance.  Neck: The appearance of the neck is normal and no neck mass is present.  Pulmonary: No respiratory distress and normal respiratory rhythm and effort.  Cardiovascular: Heart rate and rhythm are normal . No peripheral edema.  Abdomen: The abdomen is soft and nontender. No masses are palpated. No CVA  tenderness. No hernias are palpable. No hepatosplenomegaly noted.  Genitourinary: Examination of the penis demonstrates no discharge, no masses, no lesions and a normal meatus. The penis is uncircumcised.  The scrotum is without lesions. The right epididymis is palpably normal and non-tender. The left epididymis is palpably normal and non-tender. The right testis is non-tender and without masses. The left testis is non-tender and without masses.  Lymphatics: The femoral and inguinal nodes are not enlarged or tender.  Skin: Normal skin turgor, no visible rash and no visible skin lesions.  Neuro/Psych:. Mood and affect are appropriate.    Results/Data Urine  COLOR YELLOW  APPEARANCE CLEAR  SPECIFIC GRAVITY 1.025  pH 6.0  GLUCOSE NEG mg/dL BILIRUBIN NEG  KETONE NEG mg/dL BLOOD TRACE  PROTEIN NEG mg/dL UROBILINOGEN 0.2 mg/dL NITRITE NEG  LEUKOCYTE ESTERASE NEG  SQUAMOUS EPITHELIAL/HPF FEW  WBC NONE SEEN WBC/hpf RBC 0-2 RBC/hpf BACTERIA NONE SEEN  CRYSTALS NONE SEEN  CASTS NONE SEEN   Old records or history reviewed: Notes from Dr. Juliann Mule as above.  The following images/tracing/specimen were independently visualized:  CT scan as above.  The following clinical lab reports were reviewed:  UA: Completely clear today with no red or white cells noted microscopically.  The following radiology reports were reviewed: CT scan.    Procedure: Cystoscopy done 10/10/13  Indication: Hematuria. Bladder Mass.  Informed Consent: Risks, benefits, and potential adverse events were discussed and informed consent was obtained from the patient.  Prep: The patient was prepped with betadine.  Anesthesia:. Local anesthesia was administered intraurethrally with 2% lidocaine jelly.  Procedure Note:  Urethral meatus:. No abnormalities.  Anterior urethra: No abnormalities.  Prostatic urethra: No abnormalities.  Bladder: Visulization was clear. The ureteral orifices were in the normal anatomic position bilaterally and had clear efflux of urine. multiple tumors were identified in the bladder1  A  papillary1  tumor was seen in the [place] . This tumor was located  on the left side1  of the bladder1 . Another   papillary1  tumor was seen in the [place] . This tumor was located  on the right side1  ,  on the anterior aspect1  of the bladder1 . The patient tolerated the procedure well.  Complications: None.    Assessment Assessed    I went over the results of the CT scan as well as my cystoscopic findings today which have revealed a bladder mass consistent with transitional cell carcinoma.     We discussed the fact that currently there is no evidence of extravesical extension or pelvic adenopathy based on CT scan findings. Further characterization of the lesion is required for grading and staging purposes. We discussed proceeding with evaluation using transurethral resection of the lesion. I have discussed the procedure in detail as well as the potential risks and complications associated with this form of surgery. We also discussed the probability of successful resection of the intravesical portion of this lesion. I have recommended, as long as there is no contraindication at the time of surgery, the placement of intravesical mitomycin-C in order to reduce the risk of recurrence. We did discuss the potential side effects of this form of intravesical chemotherapy. The procedure will be performed under anesthesia as an outpatient.   Plan  He will be scheduled for transurethral resection of his bladder tumor.

## 2013-10-26 ENCOUNTER — Encounter (HOSPITAL_COMMUNITY): Payer: Commercial Managed Care - HMO | Admitting: Certified Registered Nurse Anesthetist

## 2013-10-26 ENCOUNTER — Ambulatory Visit (HOSPITAL_COMMUNITY): Payer: Commercial Managed Care - HMO | Admitting: Certified Registered Nurse Anesthetist

## 2013-10-26 ENCOUNTER — Ambulatory Visit (HOSPITAL_COMMUNITY)
Admission: RE | Admit: 2013-10-26 | Discharge: 2013-10-27 | Disposition: A | Discharge status: Home / Self Care | Payer: Commercial Managed Care - HMO | Source: Ambulatory Visit | Attending: Urology | Admitting: Urology

## 2013-10-26 ENCOUNTER — Encounter (HOSPITAL_COMMUNITY): Payer: Self-pay | Admitting: *Deleted

## 2013-10-26 ENCOUNTER — Encounter (HOSPITAL_COMMUNITY)
Admission: RE | Disposition: A | Discharge status: Home / Self Care | Payer: Self-pay | Source: Ambulatory Visit | Attending: Urology

## 2013-10-26 DIAGNOSIS — F17200 Nicotine dependence, unspecified, uncomplicated: Secondary | ICD-10-CM | POA: Insufficient documentation

## 2013-10-26 DIAGNOSIS — Z9221 Personal history of antineoplastic chemotherapy: Secondary | ICD-10-CM | POA: Insufficient documentation

## 2013-10-26 DIAGNOSIS — J449 Chronic obstructive pulmonary disease, unspecified: Secondary | ICD-10-CM | POA: Diagnosis not present

## 2013-10-26 DIAGNOSIS — Z85038 Personal history of other malignant neoplasm of large intestine: Secondary | ICD-10-CM | POA: Insufficient documentation

## 2013-10-26 DIAGNOSIS — Z800 Family history of malignant neoplasm of digestive organs: Secondary | ICD-10-CM | POA: Insufficient documentation

## 2013-10-26 DIAGNOSIS — M109 Gout, unspecified: Secondary | ICD-10-CM | POA: Insufficient documentation

## 2013-10-26 DIAGNOSIS — N329 Bladder disorder, unspecified: Secondary | ICD-10-CM | POA: Diagnosis present

## 2013-10-26 DIAGNOSIS — D494 Neoplasm of unspecified behavior of bladder: Secondary | ICD-10-CM

## 2013-10-26 DIAGNOSIS — J4489 Other specified chronic obstructive pulmonary disease: Secondary | ICD-10-CM | POA: Diagnosis not present

## 2013-10-26 DIAGNOSIS — C672 Malignant neoplasm of lateral wall of bladder: Secondary | ICD-10-CM | POA: Insufficient documentation

## 2013-10-26 DIAGNOSIS — C679 Malignant neoplasm of bladder, unspecified: Secondary | ICD-10-CM | POA: Diagnosis present

## 2013-10-26 DIAGNOSIS — Z9049 Acquired absence of other specified parts of digestive tract: Secondary | ICD-10-CM | POA: Diagnosis not present

## 2013-10-26 DIAGNOSIS — K219 Gastro-esophageal reflux disease without esophagitis: Secondary | ICD-10-CM | POA: Insufficient documentation

## 2013-10-26 DIAGNOSIS — C775 Secondary and unspecified malignant neoplasm of intrapelvic lymph nodes: Secondary | ICD-10-CM | POA: Diagnosis present

## 2013-10-26 HISTORY — PX: TRANSURETHRAL RESECTION OF BLADDER TUMOR WITH GYRUS (TURBT-GYRUS): SHX6458

## 2013-10-26 SURGERY — TRANSURETHRAL RESECTION OF BLADDER TUMOR WITH GYRUS (TURBT-GYRUS)
Anesthesia: General

## 2013-10-26 MED ORDER — TOLTERODINE TARTRATE ER 4 MG PO CP24: 4.0000 mg | ORAL_CAPSULE | Freq: Every day | ORAL | Status: DC

## 2013-10-26 MED ORDER — OXYCODONE HCL 5 MG/5ML PO SOLN
5.0000 mg | Freq: Once | ORAL | Status: DC | PRN
  Filled 2013-10-26: qty 5

## 2013-10-26 MED ORDER — OXYCODONE HCL 5 MG PO TABS
5.0000 mg | ORAL_TABLET | ORAL | Status: DC | PRN
  Administered 2013-10-26 – 2013-10-27 (×3): 5 mg via ORAL
  Filled 2013-10-26 (×3): qty 1

## 2013-10-26 MED ORDER — MIDAZOLAM HCL 2 MG/2ML IJ SOLN
INTRAMUSCULAR | Status: AC
  Filled 2013-10-26: qty 2

## 2013-10-26 MED ORDER — HYDROMORPHONE 0.3 MG/ML IV SOLN
INTRAVENOUS | Status: DC
  Administered 2013-10-26: 5 mg via INTRAVENOUS
  Administered 2013-10-26: 12:00:00 via INTRAVENOUS
  Administered 2013-10-26: 2.9 mg via INTRAVENOUS
  Administered 2013-10-27: 1.8 mg via INTRAVENOUS
  Administered 2013-10-27: 0.5 mL via INTRAVENOUS
  Administered 2013-10-27: 0.3 mL via INTRAVENOUS
  Filled 2013-10-26 (×2): qty 25

## 2013-10-26 MED ORDER — HYDROMORPHONE HCL PF 1 MG/ML IJ SOLN
INTRAMUSCULAR | Status: AC
  Filled 2013-10-26: qty 1

## 2013-10-26 MED ORDER — DIPHENHYDRAMINE HCL 50 MG/ML IJ SOLN: 12.5000 mg | Freq: Four times a day (QID) | INTRAMUSCULAR | Status: DC | PRN

## 2013-10-26 MED ORDER — MIDAZOLAM HCL 5 MG/5ML IJ SOLN
INTRAMUSCULAR | Status: DC | PRN
  Administered 2013-10-26: 2 mg via INTRAVENOUS

## 2013-10-26 MED ORDER — DIPHENHYDRAMINE HCL 12.5 MG/5ML PO ELIX
12.5000 mg | ORAL_SOLUTION | Freq: Four times a day (QID) | ORAL | Status: DC | PRN
  Administered 2013-10-27: 12.5 mg via ORAL
  Filled 2013-10-26: qty 5

## 2013-10-26 MED ORDER — OXYCODONE HCL 5 MG PO TABS: 5.0000 mg | ORAL_TABLET | Freq: Once | ORAL | Status: DC | PRN

## 2013-10-26 MED ORDER — PHENAZOPYRIDINE HCL 200 MG PO TABS: 200.0000 mg | ORAL_TABLET | Freq: Three times a day (TID) | ORAL | Status: DC | PRN

## 2013-10-26 MED ORDER — PROPOFOL 10 MG/ML IV BOLUS
INTRAVENOUS | Status: AC
  Filled 2013-10-26: qty 20

## 2013-10-26 MED ORDER — ZOLPIDEM TARTRATE 5 MG PO TABS: 5.0000 mg | ORAL_TABLET | Freq: Every evening | ORAL | Status: DC | PRN

## 2013-10-26 MED ORDER — ONDANSETRON HCL 4 MG/2ML IJ SOLN
INTRAMUSCULAR | Status: DC | PRN
  Administered 2013-10-26: 4 mg via INTRAVENOUS

## 2013-10-26 MED ORDER — FENTANYL CITRATE 0.05 MG/ML IJ SOLN
INTRAMUSCULAR | Status: DC | PRN
  Administered 2013-10-26 (×2): 50 ug via INTRAVENOUS

## 2013-10-26 MED ORDER — NALOXONE HCL 0.4 MG/ML IJ SOLN: 0.4000 mg | INTRAMUSCULAR | Status: DC | PRN

## 2013-10-26 MED ORDER — MEPERIDINE HCL 50 MG/ML IJ SOLN: 6.2500 mg | INTRAMUSCULAR | Status: DC | PRN

## 2013-10-26 MED ORDER — ONDANSETRON HCL 4 MG/2ML IJ SOLN
INTRAMUSCULAR | Status: AC
  Filled 2013-10-26: qty 2

## 2013-10-26 MED ORDER — SODIUM CHLORIDE 0.9 % IJ SOLN: 9.0000 mL | INTRAMUSCULAR | Status: DC | PRN

## 2013-10-26 MED ORDER — DEXAMETHASONE SODIUM PHOSPHATE 10 MG/ML IJ SOLN
INTRAMUSCULAR | Status: AC
  Filled 2013-10-26: qty 1

## 2013-10-26 MED ORDER — MITOMYCIN CHEMO FOR BLADDER INSTILLATION 40 MG: 40.0000 mg | Freq: Once | INTRAVENOUS | Status: DC

## 2013-10-26 MED ORDER — KETAMINE HCL 10 MG/ML IJ SOLN
INTRAMUSCULAR | Status: AC
  Filled 2013-10-26: qty 1

## 2013-10-26 MED ORDER — HYDROMORPHONE HCL PF 1 MG/ML IJ SOLN: 0.2500 mg | INTRAMUSCULAR | Status: DC | PRN

## 2013-10-26 MED ORDER — LIDOCAINE HCL (CARDIAC) 20 MG/ML IV SOLN
INTRAVENOUS | Status: DC | PRN
  Administered 2013-10-26: 100 mg via INTRAVENOUS

## 2013-10-26 MED ORDER — PROMETHAZINE HCL 25 MG/ML IJ SOLN: 6.2500 mg | INTRAMUSCULAR | Status: DC | PRN

## 2013-10-26 MED ORDER — PROPOFOL 10 MG/ML IV BOLUS
INTRAVENOUS | Status: DC | PRN
  Administered 2013-10-26: 200 mg via INTRAVENOUS

## 2013-10-26 MED ORDER — LIDOCAINE HCL (CARDIAC) 20 MG/ML IV SOLN
INTRAVENOUS | Status: AC
  Filled 2013-10-26: qty 5

## 2013-10-26 MED ORDER — BACITRACIN-NEOMYCIN-POLYMYXIN 400-5-5000 EX OINT
1.0000 "application " | TOPICAL_OINTMENT | Freq: Three times a day (TID) | CUTANEOUS | Status: DC | PRN
  Administered 2013-10-26: 1 via TOPICAL

## 2013-10-26 MED ORDER — LACTATED RINGERS IV SOLN
INTRAVENOUS | Status: DC | PRN
  Administered 2013-10-26: 07:00:00 via INTRAVENOUS

## 2013-10-26 MED ORDER — DEXAMETHASONE SODIUM PHOSPHATE 10 MG/ML IJ SOLN
INTRAMUSCULAR | Status: DC | PRN
  Administered 2013-10-26: 10 mg via INTRAVENOUS

## 2013-10-26 MED ORDER — OXYCODONE-ACETAMINOPHEN 5-325 MG PO TABS: 1.0000 | ORAL_TABLET | ORAL | Status: DC | PRN

## 2013-10-26 MED ORDER — ACETAMINOPHEN 325 MG PO TABS: 650.0000 mg | ORAL_TABLET | ORAL | Status: DC | PRN

## 2013-10-26 MED ORDER — HYOSCYAMINE SULFATE 0.125 MG SL SUBL
0.1250 mg | SUBLINGUAL_TABLET | SUBLINGUAL | Status: DC | PRN
  Administered 2013-10-26: 0.125 mg via ORAL
  Filled 2013-10-26: qty 1

## 2013-10-26 MED ORDER — BELLADONNA ALKALOIDS-OPIUM 16.2-60 MG RE SUPP
1.0000 | Freq: Four times a day (QID) | RECTAL | Status: DC | PRN
  Administered 2013-10-26 – 2013-10-27 (×2): 1 via RECTAL
  Filled 2013-10-26 (×2): qty 1

## 2013-10-26 MED ORDER — HYDROMORPHONE HCL PF 1 MG/ML IJ SOLN
0.2500 mg | INTRAMUSCULAR | Status: DC | PRN
  Administered 2013-10-26 (×6): 0.5 mg via INTRAVENOUS

## 2013-10-26 MED ORDER — FENTANYL CITRATE 0.05 MG/ML IJ SOLN
INTRAMUSCULAR | Status: AC
  Filled 2013-10-26: qty 2

## 2013-10-26 MED ORDER — SODIUM CHLORIDE 0.9 % IR SOLN
Status: DC | PRN
  Administered 2013-10-26: 6000 mL

## 2013-10-26 MED ORDER — MORPHINE SULFATE (PF) 1 MG/ML IV SOLN
INTRAVENOUS | Status: AC
  Filled 2013-10-26: qty 25

## 2013-10-26 MED ORDER — ONDANSETRON HCL 4 MG/2ML IJ SOLN: 4.0000 mg | Freq: Four times a day (QID) | INTRAMUSCULAR | Status: DC | PRN

## 2013-10-26 MED ORDER — CIPROFLOXACIN HCL 500 MG PO TABS
500.0000 mg | ORAL_TABLET | Freq: Two times a day (BID) | ORAL | Status: AC
  Administered 2013-10-26: 500 mg via ORAL
  Filled 2013-10-26 (×2): qty 1

## 2013-10-26 MED ORDER — KETAMINE HCL 10 MG/ML IJ SOLN
INTRAMUSCULAR | Status: DC | PRN
  Administered 2013-10-26: 30 mg via INTRAVENOUS

## 2013-10-26 MED ORDER — CIPROFLOXACIN HCL 250 MG PO TABS: 250.0000 mg | ORAL_TABLET | Freq: Two times a day (BID) | ORAL | Status: DC

## 2013-10-26 MED ORDER — CIPROFLOXACIN IN D5W 200 MG/100ML IV SOLN
200.0000 mg | INTRAVENOUS | Status: AC
  Administered 2013-10-26: 200 mg via INTRAVENOUS
  Filled 2013-10-26: qty 100

## 2013-10-26 SURGICAL SUPPLY — 16 items
BAG URINE DRAINAGE (UROLOGICAL SUPPLIES) ×1 IMPLANT
BAG URO CATCHER STRL LF (DRAPE) ×2 IMPLANT
CATH FOLEY 2WAY SLVR  5CC 20FR (CATHETERS) ×1
CATH FOLEY 2WAY SLVR 5CC 20FR (CATHETERS) IMPLANT
DRAPE CAMERA CLOSED 9X96 (DRAPES) ×2 IMPLANT
ELECT LOOP MED HF 24F 12D CBL (CLIP) ×1 IMPLANT
EVACUATOR MICROVAS BLADDER (UROLOGICAL SUPPLIES) ×1 IMPLANT
GLOVE BIOGEL M 8.0 STRL (GLOVE) ×4 IMPLANT
GOWN STRL REUS W/TWL XL LVL3 (GOWN DISPOSABLE) ×2 IMPLANT
HOLDER FOLEY CATH W/STRAP (MISCELLANEOUS) IMPLANT
KIT ASPIRATION TUBING (SET/KITS/TRAYS/PACK) ×2 IMPLANT
MANIFOLD NEPTUNE II (INSTRUMENTS) ×2 IMPLANT
NS IRRIG 1000ML POUR BTL (IV SOLUTION) ×2 IMPLANT
PACK CYSTO (CUSTOM PROCEDURE TRAY) ×2 IMPLANT
SYRINGE IRR TOOMEY STRL 70CC (SYRINGE) IMPLANT
TUBING CONNECTING 10 (TUBING) ×2 IMPLANT

## 2013-10-26 NOTE — Progress Notes (Signed)
Clot noted in cath. Irrigated with 49ml strile water and clots aspirated. Red draianage returned.

## 2013-10-26 NOTE — Progress Notes (Signed)
Awake, yelling, trying to get out of bed. "I've got to go to the bathroom." Tried to orient to postop status and presence of foley cath. Reaching for cath. Meds given for c/o pain.

## 2013-10-26 NOTE — Progress Notes (Signed)
Pain better, patient tolerating diet, up and ambulated to the bathroom. Will continue to monitor. Marland Kitchen

## 2013-10-26 NOTE — Progress Notes (Signed)
Todd [name] was complaining significantly about bladder spasms.  He was uncomfortable in the recovery room.  When I saw him this afternoon after he had received a B&O suppository and also had his pain medications changed he seemed to be doing much better with no significant bladder pain reported.  He denied any abdominal discomfort although said his catheter was uncomfortable in the penis.  Afebrile with stable vital signs. His urine was draining through his catheter and nearly clear.  Abdomen was flat, soft and mildly tender in the suprapubic region with no peritoneal signs. His catheter was in place and draining. He had no weakness in his lower extremities.  He seems to be doing much better now.  I discussed with him the fact that a bladder perforation did occur intraoperatively and because of that I was going to leave his Foley catheter indwelling until he came back to see me in the office in 1 week and maintain him on antibiotics. I'm also going to give him pain medication and anticholinergics for bladder spasms. I anticipate discharge in the morning with his Foley catheter and a leg bag.

## 2013-10-26 NOTE — Anesthesia Preprocedure Evaluation (Signed)
Anesthesia Evaluation  Patient identified by MRN, date of birth, ID band Patient awake    Reviewed: Allergy & Precautions, H&P , NPO status , Patient's Chart, lab work & pertinent test results  Airway Mallampati: II TM Distance: >3 FB Neck ROM: Full    Dental  (+) Dental Advisory Given   Pulmonary shortness of breath, asthma , COPDCurrent Smoker,  breath sounds clear to auscultation        Cardiovascular negative cardio ROS  Rhythm:Regular Rate:Normal     Neuro/Psych PSYCHIATRIC DISORDERS Depression negative neurological ROS     GI/Hepatic Neg liver ROS, GERD-  Medicated,  Endo/Other  negative endocrine ROS  Renal/GU negative Renal ROS     Musculoskeletal negative musculoskeletal ROS (+)   Abdominal   Peds  Hematology negative hematology ROS (+)   Anesthesia Other Findings   Reproductive/Obstetrics negative OB ROS                           Anesthesia Physical Anesthesia Plan  ASA: III  Anesthesia Plan: General   Post-op Pain Management:    Induction: Intravenous  Airway Management Planned: LMA  Additional Equipment:   Intra-op Plan:   Post-operative Plan: Extubation in OR  Informed Consent: I have reviewed the patients History and Physical, chart, labs and discussed the procedure including the risks, benefits and alternatives for the proposed anesthesia with the patient or authorized representative who has indicated his/her understanding and acceptance.   Dental advisory given  Plan Discussed with: CRNA  Anesthesia Plan Comments:         Anesthesia Quick Evaluation

## 2013-10-26 NOTE — Op Note (Signed)
PATIENT:  Todd [name]  PRE-OPERATIVE DIAGNOSIS: Bladder tumor  POST-OPERATIVE DIAGNOSIS: Same  PROCEDURE:  Procedure(s): TRANSURETHRAL RESECTION OF BLADDER TUMOR (TURBT) (1.0cm.)  SURGEON:  Surgeon(s): Claybon Jabs  ANESTHESIA:   General  EBL:  Minimal  DRAINS: Urinary Catheter (20 Fr. Foley)   SPECIMEN:  Source of Specimen:  Bladder tumor  DISPOSITION OF SPECIMEN:  PATHOLOGY  Indication: [name] is a 58 year old male who underwent a CT scan to 2 abdominal pain which revealed a 10 mm mass on the left wall of the bladder. This was confirmed cystoscopically in my office to be a papillary tumor and we discussed the treatment options. There did not appear to be any evidence of extravesical disease. He therefore is brought to the operating room for transurethral resection of his bladder tumor.   Description of operation: The patient was taken to the operating room and administered general anesthesia. He was then placed on the table and moved to the dorsal lithotomy position after which his genitalia was sterilely prepped and draped. An official timeout was then performed.  The 32 French resectoscope with the 12 lens and visual obturator were then passed down the urethra under direct visualization. Urethra appeared normal. The visual obturator was then removed and the Gyrus resectoscope element with 12 lens was then inserted and the bladder was fully and systematically inspected. Ureteral orifices were noted to be in the normal anatomic positions.   I first began by resecting the tumor which was resected down to the level of the bladder wall. While this was being performed he experienced an obturator reflex so I decreased the energy to the loop from 180 down to 90 and finish the resection. All visible tumor had been completely resected from the bladder and I used the Microvasive evacuator to evacuate all portions of the bladder tumor which were sent to pathology. Reinspection of the  bladder revealed all obvious tumor had been fully resected however there was fat visible at the site of the resection indicating the resection had traversed the full wall of the bladder. This was located on the left wall at the floor of the bladder. I then removed the resectoscope.  A 20 French Foley catheter was then inserted in the bladder and irrigated. The irrigant returned slightly pink with no clots. The patient was awakened and taken to the recovery room.  Because it appeared there was a degree of bladder wall perforation that was clearly extraperitoneal I felt Foley catheter drainage for a prolonged period would be indicated and in addition I elected not to instill intravesical mitomycin.  PLAN OF CARE: Discharge to home after PACU  PATIENT DISPOSITION:  PACU - hemodynamically stable.

## 2013-10-26 NOTE — Progress Notes (Signed)
Patient admitted from PACU to 1409, alert and oriented C/O pain around bladder and sensation to urinate. Notified Dr. Karsten Ro who ordered PCA dilaudid and B&O suppository. Will continue to assess patient.

## 2013-10-26 NOTE — Transfer of Care (Signed)
Immediate Anesthesia Transfer of Care Note  Patient: Todd [name]  Procedure(s) Performed: Procedure(s) (LRB): TRANSURETHRAL RESECTION OF BLADDER TUMOR WITH GYRUS (TURBT-GYRUS)  (N/A)  Patient Location: PACU  Anesthesia Type: General  Level of Consciousness: sedated, patient cooperative and responds to stimulation  Airway & Oxygen Therapy: Patient Spontanous Breathing and Patient connected to face mask oxgen  Post-op Assessment: Report given to PACU RN and Post -op Vital signs reviewed and stable  Post vital signs: Reviewed and stable  Complications: No apparent anesthesia complications

## 2013-10-26 NOTE — Anesthesia Postprocedure Evaluation (Signed)
Anesthesia Post Note  Patient: Todd [name]  Procedure(s) Performed: Procedure(s) (LRB): TRANSURETHRAL RESECTION OF BLADDER TUMOR WITH GYRUS (TURBT-GYRUS)  (N/A)  Anesthesia type: General  Patient location: PACU  Post pain: Pain level controlled  Post assessment: Post-op Vital signs reviewed  Last Vitals: BP 144/74  Pulse 60  Temp(Src) 36.6 C (Oral)  Resp 12  Ht 5\' 11"  (1.803 m)  Wt 204 lb (92.534 kg)  BMI 28.46 kg/m2  SpO2 99%  Post vital signs: Reviewed  Level of consciousness: sedated  Complications: No apparent anesthesia complications

## 2013-10-27 DIAGNOSIS — C672 Malignant neoplasm of lateral wall of bladder: Secondary | ICD-10-CM | POA: Diagnosis not present

## 2013-10-27 MED ORDER — SENNOSIDES-DOCUSATE SODIUM 8.6-50 MG PO TABS: 1.0000 | ORAL_TABLET | Freq: Two times a day (BID) | ORAL | Status: DC

## 2013-10-27 MED ORDER — OXYBUTYNIN CHLORIDE 5 MG PO TABS: 5.0000 mg | ORAL_TABLET | Freq: Three times a day (TID) | ORAL | Status: DC | PRN

## 2013-10-27 NOTE — Discharge Summary (Signed)
Physician Discharge Summary  Patient ID: Todd [name] MRN: [id number] DOB/AGE: 58-Jul-1957 58 y.o.  Admit date: 10/26/2013 Discharge date: 10/27/2013  Admission Diagnoses: Bladder Cancer  Discharge Diagnoses:  Active Problems:   Bladder tumor   Discharged Condition: good  Hospital Course:   1 - Bladder Cancer -  Underwent transurethral resection of bladder tumro 10/26/13 by Dr. Karsten Ro. Small bladder perforation identified due to obturator reflex therefore observed with foley overnight. By 7/18, the ay of discharge, pt ambulatory, pain manageable in PO meds, tolerating regular diet, foley clear and felt to be adequate for discharge. His largest complaint is occasional bladder spasm.   Consults: None  Significant Diagnostic Studies: labs: surgical pathology - pending  Treatments: surgery:  transurethral resection of bladder tumro 10/26/13   Discharge Exam: Blood pressure 133/69, pulse 60, temperature 98.2 F (36.8 C), temperature source Oral, resp. rate 18, height 5\' 11"  (1.803 m), weight 92.534 kg (204 lb), SpO2 97.00%. General appearance: alert, cooperative, appears stated age and family at bedside Head: Normocephalic, without obvious abnormality, atraumatic Nose: Nares normal. Septum midline. Mucosa normal. No drainage or sinus tenderness. Throat: lips, mucosa, and tongue normal; teeth and gums normal Neck: supple, symmetrical, trachea midline Back: symmetric, no curvature. ROM normal. No CVA tenderness. Resp: non-labored Chest wall: no tenderness Cardio: Nl rate GI: soft, non-tender; bowel sounds normal; no masses,  no organomegaly Male genitalia: normal, foley c/d/i with completely clear urine.  Extremities: extremities normal, atraumatic, no cyanosis or edema Pulses: 2+ and symmetric Skin: Skin color, texture, turgor normal. No rashes or lesions Lymph nodes: Cervical, supraclavicular, and axillary nodes normal. Neurologic: Grossly normal  Disposition: 01-Home or Self  Care  Discharge Instructions   IRRIGATION OF BLADDER    Complete by:  As directed   Irrigate catheter PRN with normal saline or sterile water.     Urinary leg bag    Complete by:  As directed   Give patient a leg bag on discharge.            Medication List         buPROPion 150 MG 12 hr tablet  Commonly known as:  WELLBUTRIN SR  Take 150 mg by mouth daily after lunch.     ciprofloxacin 250 MG tablet  Commonly known as:  CIPRO  Take 1 tablet (250 mg total) by mouth 2 (two) times daily.     escitalopram 20 MG tablet  Commonly known as:  LEXAPRO  Take 20 mg by mouth every morning.     esomeprazole 40 MG capsule  Commonly known as:  NEXIUM  Take 40 mg by mouth 2 (two) times daily before a meal.     lidocaine 4 % cream  Commonly known as:  LMX  Apply 1 application topically daily as needed. Low back     lidocaine 5 %  Commonly known as:  LIDODERM  Place 1 patch onto the skin daily. Remove & Discard patch within 12 hours or as directed by MD     methadone 10 MG tablet  Commonly known as:  DOLOPHINE  Take 10 mg by mouth every 12 (twelve) hours.     mometasone 50 MCG/ACT nasal spray  Commonly known as:  NASONEX  Place 2 sprays into the nose daily as needed (congestion).     oxybutynin 5 MG tablet  Commonly known as:  DITROPAN  Take 1-2 tablets (5-10 mg total) by mouth every 8 (eight) hours as needed for bladder spasms.     oxyCODONE-acetaminophen  10-325 MG per tablet  Commonly known as:  PERCOCET  Take 1 tablet by mouth every 6 (six) hours as needed for pain.     oxyCODONE-acetaminophen 5-325 MG per tablet  Commonly known as:  ROXICET  Take 1-2 tablets by mouth every 4 (four) hours as needed for severe pain.     phenazopyridine 200 MG tablet  Commonly known as:  PYRIDIUM  Take 1 tablet (200 mg total) by mouth 3 (three) times daily as needed for pain.     senna-docusate 8.6-50 MG per tablet  Commonly known as:  Senokot-S  Take 1 tablet by mouth 2 (two) times  daily. While taking pain and spasm medicines to prevent constipation     tolterodine 4 MG 24 hr capsule  Commonly known as:  DETROL LA  Take 1 capsule (4 mg total) by mouth daily.           Follow-up Information   Follow up with Claybon Jabs, MD On 11/05/2013. (at 2:45)    Specialty:  Urology   Contact information:   [address] [place] [address] [phone]       Signed: Alexis Frock 10/27/2013, 7:19 AM

## 2013-10-30 ENCOUNTER — Telehealth: Payer: Self-pay | Admitting: Internal Medicine

## 2013-10-30 ENCOUNTER — Ambulatory Visit (HOSPITAL_BASED_OUTPATIENT_CLINIC_OR_DEPARTMENT_OTHER): Payer: 59 | Admitting: Internal Medicine

## 2013-10-30 ENCOUNTER — Encounter: Payer: Self-pay | Admitting: Internal Medicine

## 2013-10-30 ENCOUNTER — Ambulatory Visit: Payer: PRIVATE HEALTH INSURANCE

## 2013-10-30 VITALS — BP 142/81 | HR 83 | Temp 98.5°F | Resp 19 | Ht 71.0 in | Wt 208.1 lb

## 2013-10-30 DIAGNOSIS — Z1589 Genetic susceptibility to other disease: Secondary | ICD-10-CM

## 2013-10-30 DIAGNOSIS — K7689 Other specified diseases of liver: Secondary | ICD-10-CM

## 2013-10-30 DIAGNOSIS — F17200 Nicotine dependence, unspecified, uncomplicated: Secondary | ICD-10-CM

## 2013-10-30 DIAGNOSIS — D494 Neoplasm of unspecified behavior of bladder: Secondary | ICD-10-CM

## 2013-10-30 DIAGNOSIS — Z85038 Personal history of other malignant neoplasm of large intestine: Secondary | ICD-10-CM

## 2013-10-30 NOTE — Progress Notes (Signed)
[facility] OFFICE PROGRESS NOTE  Todd Font, MD [address] [place] [address]  DIAGNOSIS: History of colon cancer - Plan: CBC with Differential, Comprehensive metabolic panel (Cmet) - [facility], Lactate dehydrogenase (LDH) - [facility]  MLH1 gene mutation - Plan: CBC with Differential, Comprehensive metabolic panel (Cmet) - [facility], Lactate dehydrogenase (LDH) - [facility]  Bladder tumor  Chief Complaint  Patient presents with  . History of colon cancer    CURRENT TREATMENT: S/p TURBT with gyrus for TCC on 10/26/2013 by Dr. Karsten Ro.   INTERVAL HISTORY: [name] 58 y.o. male who is being referred by Dr. Jeanie [name] for follow up regarding his history of colon cancer and newly diagnosed TCC s/p TURBT (10/26/2013). Today, he is accompanied by his wife and son.   Today, he reports that he underwent transurethral resection of bladder tumor 10/26/13 by Dr. Karsten Ro. Small bladder perforation identified due to obturator reflex therefore has been observed with a foley. By 7/18, the day of discharge, pt ambulatory, pain manageable in PO meds, tolerating regular diet, foley clear and felt to be adequate for discharge. He continues to have an occasional bladder spasm. He has a follow up with Dr. Karsten Ro on 07/27. He is on ditropan 5 mg every 8 hours for bladder spams.  He was also started on pyrdium 200 mg tid as needed for pain.  He take detrol LA 4 mg daily.   He is also on ciprofloxacin 250 mg bid for 7 days since 10/26/2013.  His pain is managed partially with  roxicet 1-2 tabs every 4 hours and he is on methadone 10 mg bid chronically.   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 He underwent surgical resection of transverse colon in [name] (Dr. Conni Slipper). Tumor was staged as  T3N0M0. However, only six lymph nodes were identified in the surgical specimen. The patient also has a strong family history of colon cancer and colon polyposis and underwent testing for Lynch syndrome. 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   He was offered adjuvant chemotherapy with 5-FU leucovorin after his surgery; however, he stopped chemotherapy after only two cycles for adverse side effects. The patient was seen by Dr. Leonie [name] in October 2014. At that time, he was complaining of non-specific abdominal pain and bloating. A CT scan of chest, abdomen, and pelvis done in September 2014 showed two small lesions in the liver, which appeared to be unchanged compared with previous Ct scans done in 2013 in [name]. He was also noted to have two small lesions in the lung of undetermined etiology. These could not be identified on previous CT scans done because that area was not included in the CT scans. He had CT of abdomen and pelvis on 05/08/2011 with no evidence of metastatic disease in the abdomen or pelvis. There was mild narrowing at apex of redundant sigmoid colon, most likely normal variation. There was 7 mm benign appearing lesion in the upper right lobe of liver. He also had an MRI of brain (05/07/2013) which demonstrated no evidence of brain metastasis. CT of chest was without evidence of metastatic disease in the chest. He did have have pulmonary emphysema in the upper lung zones. There was a 7 mm benign cyst in the upper right lobe of  liver. Last colonoscopy was 01/19/2013 which demonstrated done at [name]. Biopsy was taken with removal of one polyp. He had labs done on 04/24/2013 which revealed a normal WBC, Hgb and plt. His CEA was 1.2.   He was followed by Dr. Cristy [name] of [name] with last visit on 04/24/2013 ([name]). He was also seen by Dr. Jeanie [name] of [name] on 08/09/2013 to reestablish a PCP relation  since moving forml [name]. Of note, he was also referred to [name] for pain management. He has chronic lower back pain and is on the following: Methadone 10 mg one tab every 12 hours as needed; oxycodone-acetaminophen 10-325 mg one tab daily; buproprion HCL 150 mg daily. He reports being disabled for 12 years. He reports having a back surgery in 1998, 2 in 1999 and the last one was in 2002. He reports the L5-S1 ruptured secondary to injury and had subsequent collapse requiring fusion. 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 He moved back to [name] from [name] (he stayed there nearly one year) to help care for his elderly mother and father. He report two brothers with colon cancer (age 7 at diagnosis, died at age 55); sister, (age 33) and brother (age 38). He also reports maternal uncles and their children having multiple cancers. His mother who is still living has had male cancers. He has two sons. His oldest son is in his early forties (he has spoken with his mother). He has a son name [name], age 37. He has four daughters, (ages 40--[name], 36--[name], 38--[name], 33--paula). [name] and patrice had colonoscopies and had removal of polyps. 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 He is unsure of his other children lynch syndrome status.  He also reports right lateral leg a knot that is sore to touch over the past 2 months. He also notes one in his groin over the past 6 months. He notes no increase in size recently. He denies fevers or chills. He notes weight lost of 12 lbs from 222 lbs in January to 210 lbs. He denies difficulty swallowing or chewing. He denies alcohol use. He smokes 3-4 cigarettes daily down from one pack per day. He has smoked for over 30 years.   MEDICAL HISTORY: Past Medical History  Diagnosis Date  . COPD (chronic obstructive pulmonary disease)   . GERD (gastroesophageal reflux disease)   . Depression   . Chronic pain syndrome   . MLH1 gene mutation   .  Facial droop     "few days ago woke up with slight twisted mouth, went back to sleep and it was gone"  . Asthma     as child  . Shortness of breath   . Arthritis   . Low back pain     constant  . Colon cancer July 2013  . Bladder mass     x3    INTERIM HISTORY: has History of colon cancer; COPD (chronic obstructive pulmonary disease); GERD (gastroesophageal reflux disease); Depression; Chronic pain syndrome; MLH1 gene mutation; Testicular pain, right; and Bladder tumor on his problem list.    ALLERGIES:  is allergic to vicodin; milk-related compounds; and clindamycin/lincomycin.  MEDICATIONS: has a current medication list which includes the following prescription(s): bupropion, ciprofloxacin, escitalopram, esomeprazole, lidocaine, lidocaine, methadone, mometasone, oxybutynin, oxycodone-acetaminophen, oxycodone-acetaminophen, phenazopyridine, senna-docusate, and tolterodine.  SURGICAL HISTORY:  Past Surgical History  Procedure Laterality Date  . Surgical resection of transverse colon  2013  .  Back surgery  2001    x4, lower back  . Cholecystectomy  2013  . Colonoscopy w/ polypectomy  Nov. 2014  . Tonsillectomy  age 71    REVIEW OF SYSTEMS:   Constitutional: Denies fevers, chills or abnormal weight loss Eyes: Denies blurriness of vision Ears, nose, mouth, throat, and face: Denies mucositis or sore throat Respiratory: Denies cough, dyspnea or wheezes Cardiovascular: Denies palpitation, chest discomfort or lower extremity swelling Gastrointestinal:  Denies nausea, heartburn or change in bowel habits Skin: Denies abnormal skin rashes Lymphatics: Denies Todd lymphadenopathy or easy bruising Neurological:Denies numbness, tingling or Todd weaknesses Behavioral/Psych: Mood is stable, no Todd changes  All other systems were reviewed with the patient and are negative.  PHYSICAL EXAMINATION: ECOG PERFORMANCE STATUS: 0 - Asymptomatic  Blood pressure 142/81, pulse 83, temperature 98.5 F  (36.9 C), temperature source Oral, resp. rate 19, height _0  (1.803 m), weight 208 lb 1.6 oz (94.394 kg), SpO2 97.00%.  GENERAL:alert, no distress and comfortable; sitting in wheel chair.  Foley in place.  SKIN: skin color, texture, turgor are normal, no rashes or significant lesions EYES: normal, Conjunctiva are pink and non-injected, sclera clear OROPHARYNX:no exudate, no erythema and lips, buccal mucosa, and tongue normal  NECK: supple, thyroid normal size, non-tender, without nodularity LYMPH:  no palpable lymphadenopathy in the cervical, axillary or supraclavicular LUNGS: clear to auscultation with normal breathing effort, no wheezes or rhonchi HEART: regular rate & rhythm and no murmurs and no lower extremity edema ABDOMEN:abdomen soft, non-tender and normal bowel sounds Musculoskeletal:no cyanosis of digits and no clubbing  NEURO: alert & oriented x 3 with fluent speech, no focal motor/sensory deficits  Labs:  Lab Results  Component Value Date   WBC 6.5 10/22/2013   HGB 14.7 10/22/2013   HCT 43.6 10/22/2013   MCV 96.5 10/22/2013   PLT 244 10/22/2013   NEUTROABS 2.9 09/24/2013      Chemistry      Component Value Date/Time   NA 139 10/22/2013 1135   NA 140 08/28/2013 1614   K 4.1 10/22/2013 1135   K 4.2 08/28/2013 1614   CL 101 10/22/2013 1135   CO2 25 10/22/2013 1135   CO2 22 08/28/2013 1614   BUN 11 10/22/2013 1135   BUN 15.5 08/28/2013 1614   CREATININE 1.46* 10/22/2013 1135   CREATININE 1.3 08/28/2013 1614      Component Value Date/Time   CALCIUM 9.2 10/22/2013 1135   CALCIUM 8.9 08/28/2013 1614   ALKPHOS 67 09/24/2013 1622   ALKPHOS 59 08/28/2013 1614   AST 15 09/24/2013 1622   AST 27 08/28/2013 1614   [name] 19 09/24/2013 1622   [name] 31 08/28/2013 1614   BILITOT 0.6 09/24/2013 1622   BILITOT 0.46 08/28/2013 1614       Basic Metabolic Panel: No results found for this basename: NA, K, CL, CO2, GLUCOSE, BUN, CREATININE, CALCIUM, MG, PHOS,  in the last 168 hours GFR Estimated  Creatinine Clearance: 65.5 ml/min (by C-G formula based on Cr of 1.46). Liver Function Tests: No results found for this basename: AST, [name], ALKPHOS, BILITOT, PROT, ALBUMIN,  in the last 168 hours No results found for this basename: LIPASE, AMYLASE,  in the last 168 hours No results found for this basename: AMMONIA,  in the last 168 hours Coagulation profile No results found for this basename: INR, PROTIME,  in the last 168 hours  CBC: No results found for this basename: WBC, NEUTROABS, HGB, HCT, MCV, PLT,  in the last 168  hours  Anemia work up No results found for this basename: VITAMINB12, FOLATE, FERRITIN, TIBC, IRON, RETICCTPCT,  in the last 72 hours  Studies:  No results found.   RADIOGRAPHIC STUDIES: Dg Chest 2 View  10/22/2013   CLINICAL DATA:  Preop TURBT  EXAM: CHEST  2 VIEW  COMPARISON:  07/17/2003  FINDINGS: The heart size and mediastinal contours are within normal limits. Both lungs are clear. The visualized skeletal structures are unremarkable.  IMPRESSION: No active cardiopulmonary disease.   Electronically Signed   By: Kathreen Devoid   On: 10/22/2013 13:52   PATHOLOGY:  Colon cancer  Location: Transverse colon  Date of Diagnosis: 09/2011  TNM staging: T3, N0, M0, staging pathologic.  Node positive disease: Number of lymph nodes removed: 6, Number of positive lymph nodes: 0.  Stage at diagnosis: IIA  Histology: adenocarcinoma. Histologic grade: Grade: 1. KRAS gene: unknown. BRAF gene: unknown. NRAS gene: unknown. Microsatellite instability (MSI): unknown. EGFR expression: unknown. Lynch syndrome testing: MLH1: present, MSH2: absent, MSH6:absenst, PMS2: absent.   ASSESSMENT: [name] 58 y.o. male with a history of History of colon cancer - Plan: CBC with Differential, Comprehensive metabolic panel (Cmet) - [facility], Lactate dehydrogenase (LDH) - [facility]  MLH1 gene mutation - Plan: CBC with Differential, Comprehensive metabolic panel (Cmet) - [facility], Lactate dehydrogenase (LDH) -  [facility]  Bladder tumor   PLAN:   1. TCC s/p TURBT (10/22/2013). --Patient has follow up with Dr. Karsten Ro on 07/27.  He is awaiting his final pathology to determine extent of bladder involvement.  He will call his office for further management of his pain and/or consideration of local anesthetics as needed.   2. Colon Cancer, Stage IIA.  --He completed 2 cycles of adjuvant chemotherapy following resection. He is now on active surveillance with his last scans done January of 2015. He had CT of chest/abdomen/pelvis and MRI of the brain. All were negative of metastatic disease. He does have a 7 mm lung nodule as noted below which was felt to be benign. We recommended to continue follow up with labs and CEA and doctor's visits every 3-6 months for first 2 years then 6- 12 months for years 3-5 per NCCN guidelines. We also recommended at minimal annual scans for the first 5 years. We will consider scanning based on clinical symptoms as well and in consideration of his family and genetic history as noted in #3. His last colonoscopy was 01/19/2013 which demonstrated done at [name] [place]. Biopsy was taken with removal of one polyp. He had labs done on 04/24/2013 which revealed a normal WBC, Hgb and plt. His CEA was 1.2.   --He has an appointment with Dr. Deatra Ina on 11/27/2013.    3. Lynch syndrome.  --He has a strong family history of colon cancer and colon polyposis and underwent testing for Lynch syndrome. 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 He will follow up with his GI for colonoscopy as warranted by GI. He has an appointment with Dr. Deatra Ina on 11/27/2013.    4. Left scrotal swelling, resolved.   5. 7 mm benign cyst in the upper right lobe of liver.   6. Pulmonary emphysema/Tobacco abuse.  --Patient counseled to stop smoking but declined tobacco cessation classes.   7. Elevated  Creatinine. --His creatinine is 1.46 up from 1.39 on 09/24/2013.   8. Follow up.  --We recommend that he follow up in 1 months  with CBC, Chemistries.   All questions were answered. The patient knows to call the [name] with any problems, questions or concerns. We can certainly see the patient much sooner if necessary.  I spent 15 minutes counseling the patient face to face. The total time spent in the appointment was 25 minutes.    Leory Allinson, MD 10/30/2013 9:53 AM

## 2013-10-30 NOTE — Telephone Encounter (Signed)
Pt confirmed labs/ov per 07/21 POF, gave pt AVS...KJ °

## 2013-11-27 ENCOUNTER — Encounter: Payer: Self-pay | Admitting: Internal Medicine

## 2013-11-27 ENCOUNTER — Telehealth: Payer: Self-pay | Admitting: Internal Medicine

## 2013-11-27 ENCOUNTER — Ambulatory Visit (HOSPITAL_BASED_OUTPATIENT_CLINIC_OR_DEPARTMENT_OTHER): Payer: 59 | Admitting: Internal Medicine

## 2013-11-27 ENCOUNTER — Other Ambulatory Visit (HOSPITAL_BASED_OUTPATIENT_CLINIC_OR_DEPARTMENT_OTHER): Payer: 59

## 2013-11-27 VITALS — BP 144/94 | HR 73 | Temp 98.3°F | Resp 19 | Ht 71.0 in | Wt 206.0 lb

## 2013-11-27 DIAGNOSIS — D494 Neoplasm of unspecified behavior of bladder: Secondary | ICD-10-CM

## 2013-11-27 DIAGNOSIS — J438 Other emphysema: Secondary | ICD-10-CM

## 2013-11-27 DIAGNOSIS — Z1589 Genetic susceptibility to other disease: Secondary | ICD-10-CM

## 2013-11-27 DIAGNOSIS — Z1509 Genetic susceptibility to other malignant neoplasm: Secondary | ICD-10-CM

## 2013-11-27 DIAGNOSIS — M25551 Pain in right hip: Secondary | ICD-10-CM

## 2013-11-27 DIAGNOSIS — Z809 Family history of malignant neoplasm, unspecified: Secondary | ICD-10-CM

## 2013-11-27 DIAGNOSIS — Z85038 Personal history of other malignant neoplasm of large intestine: Secondary | ICD-10-CM

## 2013-11-27 DIAGNOSIS — G894 Chronic pain syndrome: Secondary | ICD-10-CM

## 2013-11-27 DIAGNOSIS — R911 Solitary pulmonary nodule: Secondary | ICD-10-CM

## 2013-11-27 DIAGNOSIS — K7689 Other specified diseases of liver: Secondary | ICD-10-CM

## 2013-11-27 DIAGNOSIS — M25559 Pain in unspecified hip: Secondary | ICD-10-CM

## 2013-11-27 DIAGNOSIS — R944 Abnormal results of kidney function studies: Secondary | ICD-10-CM

## 2013-11-27 DIAGNOSIS — M25552 Pain in left hip: Secondary | ICD-10-CM

## 2013-11-27 DIAGNOSIS — F17200 Nicotine dependence, unspecified, uncomplicated: Secondary | ICD-10-CM

## 2013-11-27 DIAGNOSIS — Z800 Family history of malignant neoplasm of digestive organs: Secondary | ICD-10-CM

## 2013-11-27 LAB — COMPREHENSIVE METABOLIC PANEL (CC13)
ALT: 13 U/L (ref 0–55)
AST: 14 U/L (ref 5–34)
Albumin: 3.5 g/dL (ref 3.5–5.0)
Alkaline Phosphatase: 62 U/L (ref 40–150)
Anion Gap: 7 mEq/L (ref 3–11)
BUN: 13.3 mg/dL (ref 7.0–26.0)
CO2: 25 mEq/L (ref 22–29)
Calcium: 9.1 mg/dL (ref 8.4–10.4)
Chloride: 108 mEq/L (ref 98–109)
Creatinine: 1.4 mg/dL — ABNORMAL HIGH (ref 0.7–1.3)
Glucose: 101 mg/dl (ref 70–140)
Potassium: 4.2 mEq/L (ref 3.5–5.1)
Sodium: 140 mEq/L (ref 136–145)
Total Bilirubin: <0.2 mg/dL (ref 0.20–1.20)
Total Protein: 6.7 g/dL (ref 6.4–8.3)

## 2013-11-27 LAB — CBC WITH DIFFERENTIAL/PLATELET
BASO%: 0.9 % (ref 0.0–2.0)
Basophils Absolute: 0 10*3/uL (ref 0.0–0.1)
EOS%: 5.3 % (ref 0.0–7.0)
Eosinophils Absolute: 0.2 10*3/uL (ref 0.0–0.5)
HCT: 46.9 % (ref 38.4–49.9)
HGB: 15.3 g/dL (ref 13.0–17.1)
LYMPH%: 54.8 % — ABNORMAL HIGH (ref 14.0–49.0)
MCH: 33.4 pg (ref 27.2–33.4)
MCHC: 32.7 g/dL (ref 32.0–36.0)
MCV: 102 fL — ABNORMAL HIGH (ref 79.3–98.0)
MONO#: 0.5 10*3/uL (ref 0.1–0.9)
MONO%: 10.9 % (ref 0.0–14.0)
NEUT#: 1.2 10*3/uL — ABNORMAL LOW (ref 1.5–6.5)
NEUT%: 28.1 % — ABNORMAL LOW (ref 39.0–75.0)
Platelets: 202 10*3/uL (ref 140–400)
RBC: 4.6 10*6/uL (ref 4.20–5.82)
RDW: 13.4 % (ref 11.0–14.6)
WBC: 4.4 10*3/uL (ref 4.0–10.3)
lymph#: 2.4 10*3/uL (ref 0.9–3.3)

## 2013-11-27 LAB — LACTATE DEHYDROGENASE (CC13): LDH: 131 U/L (ref 125–245)

## 2013-11-27 MED ORDER — OXYCODONE-ACETAMINOPHEN 10-325 MG PO TABS: 1.0000 | ORAL_TABLET | Freq: Four times a day (QID) | ORAL | Status: DC | PRN

## 2013-11-27 NOTE — Telephone Encounter (Signed)
gv and pirnted appt sched for Aug and OCT

## 2013-11-27 NOTE — Patient Instructions (Signed)
Hip Pain Your hip is the joint between your upper legs and your lower pelvis. The bones, cartilage, tendons, and muscles of your hip joint perform a lot of work each day supporting your body weight and allowing you to move around. Hip pain can range from a minor ache to severe pain in one or both of your hips. Pain may be felt on the inside of the hip joint near the groin, or the outside near the buttocks and upper thigh. You may have swelling or stiffness as well.  HOME CARE INSTRUCTIONS   Take medicines only as directed by your health care provider.  Apply ice to the injured area:  Put ice in a plastic bag.  Place a towel between your skin and the bag.  Leave the ice on for 15-20 minutes at a time, 3-4 times a day.  Keep your leg raised (elevated) when possible to lessen swelling.  Avoid activities that cause pain.  Follow specific exercises as directed by your health care provider.  Sleep with a pillow between your legs on your most comfortable side.  Record how often you have hip pain, the location of the pain, and what it feels like. SEEK MEDICAL CARE IF:   You are unable to put weight on your leg.  Your hip is red or swollen or very tender to touch.  Your pain or swelling continues or worsens after 1 week.  You have increasing difficulty walking.  You have a fever. SEEK IMMEDIATE MEDICAL CARE IF:   You have fallen.  You have a sudden increase in pain and swelling in your hip. MAKE SURE YOU:   Understand these instructions.  Will watch your condition.  Will get help right away if you are not doing well or get worse. Document Released: 09/16/2009 Document Revised: 08/13/2013 Document Reviewed: 11/23/2012 [employer] Patient Information 2015 [employer]. This information is not intended to replace advice given to you by your health care provider. Make sure you discuss any questions you have with your health care provider.  

## 2013-11-27 NOTE — Progress Notes (Signed)
[facility] OFFICE PROGRESS NOTE  Todd Font, MD [address] [place] [address]  DIAGNOSIS: History of colon cancer  MLH1 gene mutation  Chronic pain syndrome  Bladder tumor  Chief Complaint  Patient presents with  . Follow-up    CURRENT TREATMENT: S/p TURBT with gyrus for TCC on 10/26/2013 by Todd [name].   INTERVAL HISTORY: Todd [name] 59 y.o. male who is being referred by Todd [name] for follow up regarding his history of colon cancer and newly diagnosed TCC s/p TURBT (10/26/2013). Today, he is accompanied by his wife.   He underwent transurethral resection of bladder tumor 10/26/13 by Todd [name]. Small bladder perforation identified due to obturator reflex therefore has been observed with a foley. By 7/18, the day of discharge, pt ambulatory, pain manageable in PO meds, tolerating regular diet, foley clear and felt to be adequate for discharge. He continued to have an occasional bladder spasm. He has a follow up with Todd [name] on 07/27. He is on ditropan 5 mg every 8 hours for bladder spams.  He was also started on pyrdium 200 mg tid as needed for pain.  He take detrol LA 4 mg daily.   He was also on ciprofloxacin 250 mg bid for 7 days since 10/26/2013.  His pain was managed partially with  roxicet 1-2 tabs every 4 hours and he is on methadone 10 mg bid chronically.   Today, he reports bilateral hip pain which he rate 8 out of 10.  He takes roxicet 1-2 tabs four times daily.  He is on methadone 10 mg bid and sometimes take up to four times daily.  He notes that it is a dull aching pain.  He on longstanding pain medications due to back surgeries x 4.  He has been on narcotics for since 2002.  He first started methadone in 2014. It was started by Dr. Lujean [name] of family practice out of [place].  He was on 180 percocet monthly prior to this. He also notes a sensation of fluid in the ears, memory lost. He is being followed by Dr. Iona [name] of the [facility] clinic.   He is scheduled to see Dr. Berdine [name] on the 12/13/2013.    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 He underwent surgical resection of transverse colon in [place], [place] (Dr. Conni Slipper). Tumor was staged as T3N0M0. However, only six lymph nodes were identified in the surgical specimen. The patient also has a strong family history of colon cancer and colon polyposis and underwent testing for Lynch syndrome. 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   He was offered adjuvant chemotherapy with 5-FU leucovorin after his surgery; however, he stopped chemotherapy after only two cycles for adverse side effects. The patient was seen by Dr. Leonie [name] in October 2014. At that time, he was complaining of non-specific abdominal pain and bloating. A CT scan of chest, abdomen, and pelvis done in September 2014 showed two small lesions in the liver, which appeared to be unchanged compared with previous Ct scans done in 2013 in [place]. He was also noted to have two small lesions in the lung of undetermined etiology. These could not be identified on previous CT scans done because that area was not included in the CT scans.  He had CT of abdomen and pelvis on 05/08/2011 with no evidence of metastatic disease in the abdomen or pelvis. There was mild narrowing at apex of redundant sigmoid colon, most likely normal variation. There was 7 mm benign appearing lesion in the upper right lobe of liver. He also had an MRI of brain (05/07/2013) which demonstrated no evidence of brain metastasis. CT of chest was without evidence of metastatic disease in the chest. He did have have pulmonary emphysema in the upper lung zones. There was a 7 mm benign cyst in the upper right lobe of liver. Last colonoscopy was 01/19/2013 which  demonstrated done at [facility] Delray Alt, MD. Biopsy was taken with removal of one polyp. He had labs done on 04/24/2013 which revealed a normal WBC, Hgb and plt. His CEA was 1.2.   He was followed by Dr. Cristy [name] of [facility] with last visit on 04/24/2013 (Todd [name], [place]). He was also seen by Todd [name] of [facility] on 08/09/2013 to reestablish a PCP relation since moving forml [place]. Of note, he was also referred to [facility] for pain management. He has chronic lower back pain and is on the following: Methadone 10 mg one tab every 12 hours as needed; oxycodone-acetaminophen 10-325 mg one tab daily; buproprion HCL 150 mg daily. He reports being disabled for 12 years. He reports having a back surgery in 1998, 2 in 1999 and the last one was in 2002. He reports the L5-S1 ruptured secondary to injury and had subsequent collapse requiring fusion. 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 He moved back to [place] from [place] (he stayed there nearly one year) to help care for his elderly mother and father. He report two brothers with colon cancer (age 78 at diagnosis, died at age 39); sister, (age 28) and brother (age 56). He also reports maternal uncles and their children having multiple cancers. His mother who is still living has had male cancers. He has two sons. His oldest son is in his early forties (he has spoken with his mother). He has a son name Kale, age 33. He has four daughters, (ages 40--[name], 36--[name], 38--[name], 33--paula). [name] and patrice had colonoscopies and had removal of polyps. 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 He is unsure of his other children lynch syndrome status.   He also reports right lateral leg a knot that is sore to touch over the past 2 months. He also notes one in his groin over the past 6 months. He notes no increase in size recently. He denies fevers or chills. He notes weight lost of 12 lbs from 222  lbs in January to 210 lbs. He denies difficulty swallowing or chewing. He denies alcohol use. He smokes 3-4 cigarettes daily down from one pack per day. He has smoked for over 30 years.   MEDICAL HISTORY: Past Medical History  Diagnosis Date  . COPD (chronic obstructive pulmonary disease)   . GERD (gastroesophageal reflux disease)   . Depression   . Chronic pain syndrome   . MLH1 gene mutation   . Facial droop     "few days ago woke up with slight twisted mouth, went back to sleep and it was gone"  . Asthma     as child  . Shortness of breath   . Arthritis   . Low back pain     constant  . Colon cancer July 2013  . Bladder mass  x3    INTERIM HISTORY: has History of colon cancer; COPD (chronic obstructive pulmonary disease); GERD (gastroesophageal reflux disease); Depression; Chronic pain syndrome; MLH1 gene mutation; Testicular pain, right; and Bladder tumor on his problem list.    ALLERGIES:  is allergic to vicodin; milk-related compounds; and clindamycin/lincomycin.  MEDICATIONS: has a current medication list which includes the following prescription(s): bupropion, escitalopram, esomeprazole, lidocaine, lidocaine, methadone, mometasone, oxycodone-acetaminophen, and phenazopyridine.  SURGICAL HISTORY:  Past Surgical History  Procedure Laterality Date  . Surgical resection of transverse colon  2013  . Back surgery  2001    x4, lower back  . Cholecystectomy  2013  . Colonoscopy w/ polypectomy  Nov. 2014  . Tonsillectomy  age 54  . Transurethral resection of bladder tumor with gyrus (turbt-gyrus) N/A 10/26/2013    Procedure: TRANSURETHRAL RESECTION OF BLADDER TUMOR WITH GYRUS (TURBT-GYRUS) ;  Surgeon: Claybon Jabs, MD;  Location: [facility];  Service: Urology;  Laterality: N/A;    REVIEW OF SYSTEMS:   Constitutional: Denies fevers, chills or abnormal weight loss Eyes: Denies blurriness of vision Ears, nose, mouth, throat, and face: Denies mucositis or sore  throat Respiratory: Denies cough, dyspnea or wheezes Cardiovascular: Denies palpitation, chest discomfort or lower extremity swelling Gastrointestinal:  Denies nausea, heartburn or change in bowel habits Skin: Denies abnormal skin rashes Lymphatics: Denies new lymphadenopathy or easy bruising Neurological:Denies numbness, tingling or new weaknesses Behavioral/Psych: Mood is stable, no new changes  All other systems were reviewed with the patient and are negative.  PHYSICAL EXAMINATION: ECOG PERFORMANCE STATUS: 0 - Asymptomatic  Blood pressure 144/94, pulse 73, temperature 98.3 F (36.8 C), temperature source Oral, resp. rate 19, height '5\' 11"'  (1.803 m), weight 206 lb (93.441 kg), SpO2 98.00%.  GENERAL:alert, no distress and comfortable; sitting in wheel chair.  Foley in place.  SKIN: skin color, texture, turgor are normal, no rashes or significant lesions EYES: normal, Conjunctiva are pink and non-injected, sclera clear OROPHARYNX:no exudate, no erythema and lips, buccal mucosa, and tongue normal  NECK: supple, thyroid normal size, non-tender, without nodularity LYMPH:  no palpable lymphadenopathy in the cervical, axillary or supraclavicular LUNGS: clear to auscultation with normal breathing effort, no wheezes or rhonchi HEART: regular rate & rhythm and no murmurs and no lower extremity edema ABDOMEN:abdomen soft, non-tender and normal bowel sounds Musculoskeletal:no cyanosis of digits and no clubbing  NEURO: alert & oriented x 3 with fluent speech, no focal motor/sensory deficits; unable to complete straight leg test due to the pain.    Labs:  Lab Results  Component Value Date   WBC 4.4 11/27/2013   HGB 15.3 11/27/2013   HCT 46.9 11/27/2013   MCV 102.0* 11/27/2013   PLT 202 11/27/2013   NEUTROABS 1.2* 11/27/2013      Chemistry      Component Value Date/Time   NA 140 11/27/2013 0928   NA 139 10/22/2013 1135   K 4.2 11/27/2013 0928   K 4.1 10/22/2013 1135   CL 101 10/22/2013 1135    CO2 25 11/27/2013 0928   CO2 25 10/22/2013 1135   BUN 13.3 11/27/2013 0928   BUN 11 10/22/2013 1135   CREATININE 1.4* 11/27/2013 0928   CREATININE 1.46* 10/22/2013 1135      Component Value Date/Time   CALCIUM 9.1 11/27/2013 0928   CALCIUM 9.2 10/22/2013 1135   ALKPHOS 62 11/27/2013 0928   ALKPHOS 67 09/24/2013 1622   AST 14 11/27/2013 0928   AST 15 09/24/2013 1622   ALT 13 11/27/2013 2330  ALT 19 09/24/2013 1622   BILITOT <0.20 11/27/2013 0928   BILITOT 0.6 09/24/2013 1622       Basic Metabolic Panel:  Recent Labs Lab 11/27/13 0928  NA 140  K 4.2  CO2 25  GLUCOSE 101  BUN 13.3  CREATININE 1.4*  CALCIUM 9.1   GFR Estimated Creatinine Clearance: 67.9 ml/min (by C-G formula based on Cr of 1.4). Liver Function Tests:  Recent Labs Lab 11/27/13 0928  AST 14  ALT 13  ALKPHOS 62  BILITOT <0.20  PROT 6.7  ALBUMIN 3.5   No results found for this basename: LIPASE, AMYLASE,  in the last 168 hours No results found for this basename: AMMONIA,  in the last 168 hours Coagulation profile No results found for this basename: INR, PROTIME,  in the last 168 hours  CBC:  Recent Labs Lab 11/27/13 0928  WBC 4.4  NEUTROABS 1.2*  HGB 15.3  HCT 46.9  MCV 102.0*  PLT 202    Anemia work up No results found for this basename: VITAMINB12, FOLATE, FERRITIN, TIBC, IRON, RETICCTPCT,  in the last 72 hours  Studies:  No results found.   RADIOGRAPHIC STUDIES: None  PATHOLOGY:  Colon cancer  Location: Transverse colon  Date of Diagnosis: 09/2011  TNM staging: T3, N0, M0, staging pathologic.  Node positive disease: Number of lymph nodes removed: 6, Number of positive lymph nodes: 0.  Stage at diagnosis: IIA  Histology: adenocarcinoma. Histologic grade: Grade: 1. KRAS gene: unknown. BRAF gene: unknown. NRAS gene: unknown. Microsatellite instability (MSI): unknown. EGFR expression: unknown. Lynch syndrome testing: MLH1: present, MSH2: absent, MSH6:absenst, PMS2: absent.   ASSESSMENT:  Todd [name] 58 y.o. male with a history of History of colon cancer  MLH1 gene mutation  Chronic pain syndrome  Bladder tumor   PLAN:   1. Bilateral Hip pain. --We will obtain bilaterally X-rays to exclude pathology.  Etiology likely related to degenerative joint disease consistent with longstanding DJD of his back.  We will prescribe Roxicet (#60 provided today) until he can see Dr. Cathey Endow office.  He reports that his office is in the process of referral to pain specialist.  For his back pain, he has declined surgery.   2. TCC s/p TURBT (10/22/2013). --Patient has follow up with Todd [name]. Final pathology as noted above.  He will call his office for further management of his pain and/or consideration of local anesthetics as needed.   3. Colon Cancer, Stage IIA.  --He completed 2 cycles of adjuvant chemotherapy following resection. He is now on active surveillance with his last scans done January of 2015. He had CT of chest/abdomen/pelvis and MRI of the brain. All were negative of metastatic disease. He does have a 7 mm lung nodule as noted below which was felt to be benign. We recommended to continue follow up with labs and CEA and doctor's visits every 3-6 months for first 2 years then 6- 12 months for years 3-5 per NCCN guidelines. We also recommended at minimal annual scans for the first 5 years. We will consider scanning based on clinical symptoms as well and in consideration of his family and genetic history as noted in #3. His last colonoscopy was 01/19/2013 which demonstrated done at [facility] Delray Alt, MD. Biopsy was taken with removal of one polyp. He had labs done on 04/24/2013 which revealed a normal WBC, Hgb and plt. His CEA was 1.2.   --He has an appointment with Dr. Deatra Ina on 11/27/2013.  4. Lynch syndrome.  --He has a strong family history of colon cancer and colon polyposis and underwent testing for Lynch syndrome. He was found to have MLH1 showing  deleterious mutation.He has had genetic couseling along with multiple family members. He will follow up with his GI for colonoscopy as warranted by GI. He has an appointment with Dr. Deatra Ina on 11/27/2013.    5. 7 mm benign cyst in the upper right lobe of liver.   6. Pulmonary emphysema/Tobacco abuse.  --Patient counseled to stop smoking but declined tobacco cessation classes.   7. Elevated Creatinine. --His creatinine is 1.4 down 1.46 up from 1.39 on 09/24/2013.   8. Follow up.  --We recommend that he follow up in 2 months with CBC, Chemistries and CEA.  All questions were answered. The patient knows to call the clinic with any problems, questions or concerns. We can certainly see the patient much sooner if necessary.  I spent 15 minutes counseling the patient face to face. The total time spent in the appointment was 25 minutes.    Nikesh Teschner, MD 11/27/2013 10:18 AM

## 2013-11-28 ENCOUNTER — Ambulatory Visit (HOSPITAL_COMMUNITY)
Admission: RE | Admit: 2013-11-28 | Discharge: 2013-11-28 | Disposition: A | Discharge status: Home / Self Care | Payer: Medicare HMO | Source: Ambulatory Visit | Attending: Internal Medicine | Admitting: Internal Medicine

## 2013-11-28 ENCOUNTER — Encounter: Payer: Self-pay | Admitting: Gastroenterology

## 2013-11-28 ENCOUNTER — Telehealth: Payer: Self-pay | Admitting: Internal Medicine

## 2013-11-28 ENCOUNTER — Ambulatory Visit (INDEPENDENT_AMBULATORY_CARE_PROVIDER_SITE_OTHER): Payer: Commercial Managed Care - HMO | Admitting: Gastroenterology

## 2013-11-28 VITALS — BP 116/80 | HR 80 | Ht 69.25 in | Wt 202.1 lb

## 2013-11-28 DIAGNOSIS — M25559 Pain in unspecified hip: Secondary | ICD-10-CM | POA: Insufficient documentation

## 2013-11-28 DIAGNOSIS — Z85038 Personal history of other malignant neoplasm of large intestine: Secondary | ICD-10-CM

## 2013-11-28 DIAGNOSIS — M25551 Pain in right hip: Secondary | ICD-10-CM | POA: Insufficient documentation

## 2013-11-28 DIAGNOSIS — M25552 Pain in left hip: Secondary | ICD-10-CM

## 2013-11-28 DIAGNOSIS — C189 Malignant neoplasm of colon, unspecified: Secondary | ICD-10-CM

## 2013-11-28 DIAGNOSIS — R1033 Periumbilical pain: Secondary | ICD-10-CM | POA: Insufficient documentation

## 2013-11-28 DIAGNOSIS — Z1589 Genetic susceptibility to other disease: Secondary | ICD-10-CM

## 2013-11-28 MED ORDER — NA SULFATE-K SULFATE-MG SULF 17.5-3.13-1.6 GM/177ML PO SOLN: 1.0000 | Freq: Once | ORAL | Status: DC

## 2013-11-28 NOTE — Telephone Encounter (Signed)
Left a message to call us for results of his X-ray.

## 2013-11-28 NOTE — Assessment & Plan Note (Signed)
Patient has persistent periumbilical pain.  All the labs are normal CT scan demonstrates dilation of the common bile duct which raises the question of an obstructing lesion.  If present this could explain his abdominal pain.  At the same time, patient is at increased risk for gastric malignancies which should be ruled out.  Recommendations #1 upper endoscopy #2 MRCP   

## 2013-11-28 NOTE — Progress Notes (Signed)
  _                                                                                                                History of Present Illness:  Mr. 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  For several months he's been complaining of aching midepigastric and periumbilical pain.  It is unrelated to eating.  He has the sensation of something passing through his intestines.  He takes Nexium but has occasional pyrosis.  He denies change in bowel habits or rectal bleeding.  Colonoscopy over a year ago apparently demonstrated polyps.  CT scan in June, which I reviewed, demonstrated biliary dilatation measuring up to 16 mm in the distal common bile duct.  LFTs yesterday were normal.    Past Medical History  Diagnosis Date  . COPD (chronic obstructive pulmonary disease)   . GERD (gastroesophageal reflux disease)   . Depression   . Chronic pain syndrome   . MLH1 gene mutation   . Facial droop     "few days ago woke up with slight twisted mouth, went back to sleep and it was gone"  . Asthma     as child  . Shortness of breath   . Arthritis   . Low back pain     constant  . Colon cancer July 2013  . Bladder mass     x3   Past Surgical History  Procedure Laterality Date  . Surgical resection of transverse colon  2013  . Back surgery  2001    x4, lower back  . Cholecystectomy  2013  . Colonoscopy w/ polypectomy  Nov. 2014  . Tonsillectomy  age 20  . Transurethral resection of bladder tumor with gyrus (turbt-gyrus) N/A 10/26/2013    Procedure: TRANSURETHRAL RESECTION OF BLADDER TUMOR WITH GYRUS (TURBT-GYRUS) ;  Surgeon: Mark C Ottelin, MD;  Location: [facility];  Service: Urology;  Laterality: N/A;   family history includes Breast cancer in his maternal grandmother; Colon cancer in his  brother, cousin, maternal uncle, and sister; Colon polyps in his daughter; Diabetes in his father; Stroke in his father. Current Outpatient Prescriptions  Medication Sig Dispense Refill  . buPROPion (WELLBUTRIN SR) 150 MG 12 hr tablet Take 150 mg by mouth daily after lunch.       . escitalopram (LEXAPRO) 20 MG tablet Take 20 mg by mouth every morning.       . esomeprazole (NEXIUM) 40 MG capsule Take 40 mg by mouth 2 (two) times daily before a meal.      . lidocaine (LIDODERM) 5 % Place 1 patch onto the skin daily. Remove & Discard patch within 12 hours or as directed by MD      . lidocaine (LMX) 4 % cream Apply 1 application topically daily as needed. Low back      . methadone (DOLOPHINE) 10 MG tablet Take   10 mg by mouth every 12 (twelve) hours.      . mometasone (NASONEX) 50 MCG/ACT nasal spray Place 2 sprays into the nose daily as needed (congestion).       . oxyCODONE-acetaminophen (PERCOCET) 10-325 MG per tablet Take 1 tablet by mouth every 6 (six) hours as needed for pain.  60 tablet  0  . phenazopyridine (PYRIDIUM) 200 MG tablet Take 1 tablet (200 mg total) by mouth 3 (three) times daily as needed for pain.  30 tablet  0   No current facility-administered medications for this visit.   Allergies as of 11/28/2013 - Review Complete 11/28/2013  Allergen Reaction Noted  . Vicodin [hydrocodone-acetaminophen] Itching 08/28/2013  . Lactose intolerance (gi)  11/28/2013  . Milk-related compounds  08/28/2013  . Clindamycin/lincomycin Rash 10/22/2013    reports that he has been smoking Cigarettes and E-cigarettes.  He has been smoking about 0.00 packs per day for the past 2 years. He has never used smokeless tobacco. He reports that he drinks alcohol. He reports that he does not use illicit drugs.   Review of Systems: Pertinent positive and negative review of systems were noted in the above HPI section. All other review of systems were otherwise negative.  Vital signs were reviewed in today's  medical record Physical Exam: General: Well developed , well nourished, no acute distress Skin: anicteric Head: Normocephalic and atraumatic Eyes:  sclerae anicteric, EOMI Ears: Normal auditory acuity Mouth: No deformity or lesions Neck: Supple, no masses or thyromegaly Lungs: Clear throughout to auscultation Heart: Regular rate and rhythm; no murmurs, rubs or bruits Abdomen: Soft, non tender and non distended. No masses, hepatosplenomegaly or hernias noted. Normal Bowel sounds Rectal:deferred Musculoskeletal: Symmetrical with no gross deformities  Skin: No lesions on visible extremities Pulses:  Normal pulses noted Extremities: No clubbing, cyanosis, edema or deformities noted Neurological: Alert oriented x 4, grossly nonfocal Cervical Nodes:  No significant cervical adenopathy Inguinal Nodes: No significant inguinal adenopathy Psychological:  Alert and cooperative. Normal mood and affect  See Assessment and Plan under Problem List       

## 2013-11-28 NOTE — Assessment & Plan Note (Signed)
Last colonoscopy over a year ago demonstrated multiple polyps.    Recommendations #95followup colonoscopy

## 2013-11-28 NOTE — Patient Instructions (Addendum)
You have been scheduled for an MRI/MRCP at [facility] on 12/07/2013. Your appointment time is 9am . Please arrive 15 minutes prior to your appointment time for registration purposes. Please make certain not to have anything to eat or drink after midnight. In addition, if you have any metal in your body, have a pacemaker or defibrillator, please be sure to let your ordering physician know. This test typically takes 45 minutes to 1 hour to complete.  You have been scheduled for an endoscopy. Please follow written instructions given to you at your visit today. If you use inhalers (even only as needed), please bring them with you on the day of your procedure. Your physician has requested that you go to www.[email or web address].com and enter the access code given to you at your visit today. This web site gives a general overview about your procedure. However, you should still follow specific instructions given to you by our office regarding your preparation for the procedure.   You have been scheduled for a colonoscopy. Please follow written instructions given to you at your visit today.  Please pick up your prep kit at the pharmacy within the next 1-3 days. If you use inhalers (even only as needed), please bring them with you on the day of your procedure. Your physician has requested that you go to www.[email or web address] and enter the access code given to you at your visit today. This web site gives a general overview about your procedure. However, you should still follow specific instructions given to you by our office regarding your preparation for the procedure.  Marland Kitchen

## 2013-12-03 ENCOUNTER — Other Ambulatory Visit: Payer: Commercial Managed Care - HMO

## 2013-12-03 ENCOUNTER — Other Ambulatory Visit (INDEPENDENT_AMBULATORY_CARE_PROVIDER_SITE_OTHER): Payer: Commercial Managed Care - HMO

## 2013-12-03 ENCOUNTER — Encounter: Payer: Self-pay | Admitting: Gastroenterology

## 2013-12-03 ENCOUNTER — Other Ambulatory Visit: Payer: Self-pay

## 2013-12-03 ENCOUNTER — Ambulatory Visit (AMBULATORY_SURGERY_CENTER): Payer: Commercial Managed Care - HMO | Admitting: Gastroenterology

## 2013-12-03 VITALS — BP 122/63 | HR 76 | Temp 97.7°F | Resp 21 | Ht 69.0 in | Wt 202.0 lb

## 2013-12-03 DIAGNOSIS — K5730 Diverticulosis of large intestine without perforation or abscess without bleeding: Secondary | ICD-10-CM

## 2013-12-03 DIAGNOSIS — R1033 Periumbilical pain: Secondary | ICD-10-CM

## 2013-12-03 DIAGNOSIS — Z8500 Personal history of malignant neoplasm of unspecified digestive organ: Secondary | ICD-10-CM

## 2013-12-03 DIAGNOSIS — Z85038 Personal history of other malignant neoplasm of large intestine: Secondary | ICD-10-CM

## 2013-12-03 DIAGNOSIS — R1013 Epigastric pain: Secondary | ICD-10-CM

## 2013-12-03 DIAGNOSIS — Z8481 Family history of carrier of genetic disease: Secondary | ICD-10-CM

## 2013-12-03 DIAGNOSIS — K5289 Other specified noninfective gastroenteritis and colitis: Secondary | ICD-10-CM

## 2013-12-03 LAB — AMYLASE: Amylase: 26 U/L — ABNORMAL LOW (ref 27–131)

## 2013-12-03 LAB — LIPASE: Lipase: 24 U/L (ref 11.0–59.0)

## 2013-12-03 MED ORDER — NA SULFATE-K SULFATE-MG SULF 17.5-3.13-1.6 GM/177ML PO SOLN: ORAL | Status: DC

## 2013-12-03 MED ORDER — OXYCODONE-ACETAMINOPHEN 10-325 MG PO TABS: 1.0000 | ORAL_TABLET | Freq: Four times a day (QID) | ORAL | Status: DC | PRN

## 2013-12-03 MED ORDER — HYOSCYAMINE SULFATE ER 0.375 MG PO TBCR: EXTENDED_RELEASE_TABLET | ORAL | Status: DC

## 2013-12-03 MED ORDER — SODIUM CHLORIDE 0.9 % IV SOLN: 500.0000 mL | INTRAVENOUS | Status: DC

## 2013-12-03 NOTE — Patient Instructions (Addendum)
YOU HAD AN ENDOSCOPIC PROCEDURE TODAY AT [facility]: Refer to the procedure report that was given to you for any specific questions about what was found during the examination.  If the procedure report does not answer your questions, please call your gastroenterologist to clarify.  If you requested that your care partner not be given the details of your procedure findings, then the procedure report has been included in a sealed envelope for you to review at your convenience later.  YOU SHOULD EXPECT: Some feelings of bloating in the abdomen. Passage of more gas than usual.  Walking can help get rid of the air that was put into your GI tract during the procedure and reduce the bloating. If you had a lower endoscopy (such as a colonoscopy or flexible sigmoidoscopy) you may notice spotting of blood in your stool or on the toilet paper. If you underwent a bowel prep for your procedure, then you may not have a normal bowel movement for a few days.  DIET: follow colonoscopy prep diet--clear liquids only  ACTIVITY: Your care partner should take you home directly after the procedure.  You should plan to take it easy, moving slowly for the rest of the day.  You can resume normal activity the day after the procedure however you should NOT DRIVE or use heavy machinery for 24 hours (because of the sedation medicines used during the test).    SYMPTOMS TO REPORT IMMEDIATELY: A gastroenterologist can be reached at any hour.  During normal business hours, 8:30 AM to 5:00 PM Monday through Friday, call [phone].  After hours and on weekends, please call the GI answering service at [phone] who will take a message and have the physician on call contact you.   Following upper endoscopy (EGD)  Vomiting of blood or coffee ground material  New chest pain or pain under the shoulder blades  Painful or persistently difficult swallowing  New shortness of breath  Fever of 100F or higher  Black,  tarry-looking stools  FOLLOW UP: Our staff will call the home number listed on your records the next business day following your procedure to check on you and address any questions or concerns that you may have at that time regarding the information given to you following your procedure. This is a courtesy call and so if there is no answer at the home number and we have not heard from you through the emergency physician on call, we will assume that you have returned to your regular daily activities without incident.  SIGNATURES/CONFIDENTIALITY: You and/or your care partner have signed paperwork which will be entered into your electronic medical record.  These signatures attest to the fact that that the information above on your After Visit Summary has been reviewed and is understood.  Full responsibility of the confidentiality of this discharge information lies with you and/or your care-partner.  Follow directions for colonoscopy  Your prep and hyomax prescription were sent to your [facility] pharmacy

## 2013-12-03 NOTE — Op Note (Signed)
[place]  [address] [place], [address]   ENDOSCOPY PROCEDURE REPORT  PATIENT: Todd [name], Todd [name]  MR#: [id number] BIRTHDATE: November 12, 1955 , 57  yrs. old GENDER: Male ENDOSCOPIST: Inda Castle, MD REFERRED BY:  Iona Beard, M.D. PROCEDURE DATE:  12/03/2013 PROCEDURE:  EGD, diagnostic ASA CLASS:     Class II INDICATIONS:  Epigastric pain. Lynch syndrome, history of colon cancer MEDICATIONS: MAC sedation, administered by CRNA, Fentanyl-Quick Pick, and propofol (Diprivan) 300mg  IV TOPICAL ANESTHETIC:  DESCRIPTION OF PROCEDURE: After the risks benefits and alternatives of the procedure were thoroughly explained, informed consent was obtained.  The LB [id number] endoscope was introduced through the mouth and advanced to the third portion of the duodenum. Without limitations.  The instrument was slowly withdrawn as the mucosa was fully examined.        ESOPHAGUS: A 3 cm hiatal hernia was noted.  The remainder of the upper endoscopy exam was otherwise normal. Retroflexed views revealed no abnormalities.     The scope was then withdrawn from the patient and the procedure completed.  COMPLICATIONS: There were no complications. ENDOSCOPIC IMPRESSION: 1.   3 cm hiatal hernia 2.   The remainder of the upper endoscopy exam was otherwise normal  RECOMMENDATIONS: trial of hyomax MRCP-already scheduled colonoscopy  REPEAT EXAM:  eSigned:  Inda Castle, MD 12/03/2013 2:51 PM   CC:  PATIENT NAME:  Todd [name], Todd [name] MR#: [id number]

## 2013-12-03 NOTE — Progress Notes (Addendum)
Pt was very labored in his breathing upon arrival to the RR- oxygen kept on at 2 liters per [facility].  Has frequent, harsh cough. HOB up and told to deep breathe.    After pt is more awake, oxygen is removed and pt told to take slow deep breathes  No c/o SOB at discharge  Colonoscopy instructions given to pt and carepartner for procedure on 12-04-13 at [facility]

## 2013-12-03 NOTE — Progress Notes (Signed)
Procedure ends, to recovery, report given and VSS. 

## 2013-12-04 ENCOUNTER — Ambulatory Visit (HOSPITAL_COMMUNITY): Admission: RE | Admit: 2013-12-04 | Payer: 59 | Source: Ambulatory Visit | Admitting: Gastroenterology

## 2013-12-04 ENCOUNTER — Encounter (HOSPITAL_COMMUNITY): Payer: Self-pay

## 2013-12-04 ENCOUNTER — Telehealth: Payer: Self-pay

## 2013-12-04 ENCOUNTER — Encounter (HOSPITAL_COMMUNITY): Payer: Self-pay | Admitting: *Deleted

## 2013-12-04 ENCOUNTER — Ambulatory Visit (HOSPITAL_COMMUNITY): Admit: 2013-12-04 | Payer: Self-pay | Admitting: Gastroenterology

## 2013-12-04 ENCOUNTER — Encounter (HOSPITAL_COMMUNITY)
Admission: RE | Disposition: A | Discharge status: Home / Self Care | Payer: Self-pay | Source: Ambulatory Visit | Attending: Gastroenterology

## 2013-12-04 ENCOUNTER — Other Ambulatory Visit: Payer: Self-pay | Admitting: Gastroenterology

## 2013-12-04 ENCOUNTER — Encounter (HOSPITAL_COMMUNITY): Admission: RE | Payer: Self-pay | Source: Ambulatory Visit

## 2013-12-04 ENCOUNTER — Ambulatory Visit (HOSPITAL_COMMUNITY)
Admission: RE | Admit: 2013-12-04 | Discharge: 2013-12-04 | Disposition: A | Discharge status: Home / Self Care | Payer: Medicare HMO | Source: Ambulatory Visit | Attending: Gastroenterology | Admitting: Gastroenterology

## 2013-12-04 DIAGNOSIS — E739 Lactose intolerance, unspecified: Secondary | ICD-10-CM | POA: Insufficient documentation

## 2013-12-04 DIAGNOSIS — D128 Benign neoplasm of rectum: Secondary | ICD-10-CM | POA: Diagnosis not present

## 2013-12-04 DIAGNOSIS — Z885 Allergy status to narcotic agent status: Secondary | ICD-10-CM | POA: Diagnosis not present

## 2013-12-04 DIAGNOSIS — J449 Chronic obstructive pulmonary disease, unspecified: Secondary | ICD-10-CM | POA: Diagnosis not present

## 2013-12-04 DIAGNOSIS — G894 Chronic pain syndrome: Secondary | ICD-10-CM | POA: Diagnosis not present

## 2013-12-04 DIAGNOSIS — Z79899 Other long term (current) drug therapy: Secondary | ICD-10-CM | POA: Insufficient documentation

## 2013-12-04 DIAGNOSIS — Z1589 Genetic susceptibility to other disease: Secondary | ICD-10-CM

## 2013-12-04 DIAGNOSIS — F329 Major depressive disorder, single episode, unspecified: Secondary | ICD-10-CM | POA: Insufficient documentation

## 2013-12-04 DIAGNOSIS — Z1211 Encounter for screening for malignant neoplasm of colon: Secondary | ICD-10-CM | POA: Insufficient documentation

## 2013-12-04 DIAGNOSIS — F3289 Other specified depressive episodes: Secondary | ICD-10-CM | POA: Diagnosis not present

## 2013-12-04 DIAGNOSIS — Z881 Allergy status to other antibiotic agents status: Secondary | ICD-10-CM | POA: Diagnosis not present

## 2013-12-04 DIAGNOSIS — Z1509 Genetic susceptibility to other malignant neoplasm: Secondary | ICD-10-CM | POA: Diagnosis not present

## 2013-12-04 DIAGNOSIS — D126 Benign neoplasm of colon, unspecified: Secondary | ICD-10-CM

## 2013-12-04 DIAGNOSIS — Z860100 Personal history of colon polyps, unspecified: Secondary | ICD-10-CM | POA: Insufficient documentation

## 2013-12-04 DIAGNOSIS — J4489 Other specified chronic obstructive pulmonary disease: Secondary | ICD-10-CM | POA: Insufficient documentation

## 2013-12-04 DIAGNOSIS — D129 Benign neoplasm of anus and anal canal: Secondary | ICD-10-CM

## 2013-12-04 DIAGNOSIS — Z8551 Personal history of malignant neoplasm of bladder: Secondary | ICD-10-CM | POA: Insufficient documentation

## 2013-12-04 DIAGNOSIS — K219 Gastro-esophageal reflux disease without esophagitis: Secondary | ICD-10-CM | POA: Diagnosis not present

## 2013-12-04 DIAGNOSIS — F17200 Nicotine dependence, unspecified, uncomplicated: Secondary | ICD-10-CM | POA: Diagnosis not present

## 2013-12-04 DIAGNOSIS — K5730 Diverticulosis of large intestine without perforation or abscess without bleeding: Secondary | ICD-10-CM | POA: Diagnosis not present

## 2013-12-04 DIAGNOSIS — Z85038 Personal history of other malignant neoplasm of large intestine: Secondary | ICD-10-CM | POA: Diagnosis not present

## 2013-12-04 HISTORY — PX: COLONOSCOPY: SHX5424

## 2013-12-04 SURGERY — COLONOSCOPY
Anesthesia: Moderate Sedation

## 2013-12-04 SURGERY — COLONOSCOPY WITH PROPOFOL
Anesthesia: Monitor Anesthesia Care

## 2013-12-04 MED ORDER — SODIUM CHLORIDE 0.9 % IV SOLN
INTRAVENOUS | Status: DC
  Administered 2013-12-04: 500 mL via INTRAVENOUS

## 2013-12-04 MED ORDER — MIDAZOLAM HCL 10 MG/2ML IJ SOLN
INTRAMUSCULAR | Status: DC | PRN
  Administered 2013-12-04: 2 mg via INTRAVENOUS
  Administered 2013-12-04: 1 mg via INTRAVENOUS
  Administered 2013-12-04: 2 mg via INTRAVENOUS
  Administered 2013-12-04: 1 mg via INTRAVENOUS
  Administered 2013-12-04: 2 mg via INTRAVENOUS

## 2013-12-04 MED ORDER — FENTANYL CITRATE 0.05 MG/ML IJ SOLN
INTRAMUSCULAR | Status: DC | PRN
  Administered 2013-12-04 (×3): 25 ug via INTRAVENOUS
  Administered 2013-12-04: 12.5 ug via INTRAVENOUS

## 2013-12-04 MED ORDER — GLYCOPYRROLATE 2 MG PO TABS: ORAL_TABLET | ORAL | Status: DC

## 2013-12-04 MED ORDER — DIPHENHYDRAMINE HCL 50 MG/ML IJ SOLN
INTRAMUSCULAR | Status: DC | PRN
  Administered 2013-12-04: 25 mg via INTRAVENOUS

## 2013-12-04 MED ORDER — FENTANYL CITRATE 0.05 MG/ML IJ SOLN
INTRAMUSCULAR | Status: AC
  Filled 2013-12-04: qty 2

## 2013-12-04 MED ORDER — MIDAZOLAM HCL 5 MG/5ML IJ SOLN: INTRAMUSCULAR | Status: DC | PRN

## 2013-12-04 MED ORDER — MIDAZOLAM HCL 10 MG/2ML IJ SOLN
INTRAMUSCULAR | Status: AC
  Filled 2013-12-04: qty 2

## 2013-12-04 MED ORDER — DIPHENHYDRAMINE HCL 50 MG/ML IJ SOLN
INTRAMUSCULAR | Status: AC
  Filled 2013-12-04: qty 1

## 2013-12-04 NOTE — Interval H&P Note (Signed)
History and Physical Interval Note:  12/04/2013 7:42 AM  Todd [name]  has presented today for surgery, with the diagnosis of abd pain  The various methods of treatment have been discussed with the patient and family. After consideration of risks, benefits and other options for treatment, the patient has consented to  Procedure(s): COLONOSCOPY (N/A) as a surgical intervention .  The patient's history has been reviewed, patient examined, no change in status, stable for surgery.  I have reviewed the patient's chart and labs.  Questions were answered to the patient's satisfaction.     The recent H&P (dated *11/28/13**) was reviewed, the patient was examined and there is no change in the patients condition since that H&P was completed.   Erskine Emery  12/04/2013, 7:42 AM   Erskine Emery

## 2013-12-04 NOTE — Discharge Instructions (Signed)
Colonoscopy, Care After °These instructions give you information on caring for yourself after your procedure. Your doctor may also give you more specific instructions. Call your doctor if you have any problems or questions after your procedure. °HOME CARE °· Do not drive for 24 hours. °· Do not sign important papers or use machinery for 24 hours. °· You may shower. °· You may go back to your usual activities, but go slower for the first 24 hours. °· Take rest breaks often during the first 24 hours. °· Walk around or use warm packs on your belly (abdomen) if you have belly cramping or gas. °· Drink enough fluids to keep your pee (urine) clear or pale yellow. °· Resume your normal diet. Avoid heavy or fried foods. °· Avoid drinking alcohol for 24 hours or as told by your doctor. °· Only take medicines as told by your doctor. °If a tissue sample (biopsy) was taken during the procedure:  °· Do not take aspirin or blood thinners for 7 days, or as told by your doctor. °· Do not drink alcohol for 7 days, or as told by your doctor. °· Eat soft foods for the first 24 hours. °GET HELP IF: °You still have a small amount of blood in your poop (stool) 2-3 days after the procedure. °GET HELP RIGHT AWAY IF: °· You have more than a small amount of blood in your poop. °· You see clumps of tissue (blood clots) in your poop. °· Your belly is puffy (swollen). °· You feel sick to your stomach (nauseous) or throw up (vomit). °· You have a fever. °· You have belly pain that gets worse and medicine does not help. °MAKE SURE YOU: °· Understand these instructions. °· Will watch your condition. °· Will get help right away if you are not doing well or get worse. °Document Released: 05/01/2010 Document Revised: 04/03/2013 Document Reviewed: 12/04/2012 °ExitCare® Patient Information ©2015 [employer]. This information is not intended to replace advice given to you by your health care provider. Make sure you discuss any questions you have with  your health care provider. ° °

## 2013-12-04 NOTE — H&P (View-Only) (Signed)
  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  For several months he's been complaining of aching midepigastric and periumbilical pain.  It is unrelated to eating.  He has the sensation of something passing through his intestines.  He takes Nexium but has occasional pyrosis.  He denies change in bowel habits or rectal bleeding.  Colonoscopy over a year ago apparently demonstrated polyps.  CT scan in June, which I reviewed, demonstrated biliary dilatation measuring up to 16 mm in the distal common bile duct.  LFTs yesterday were normal.    Past Medical History  Diagnosis Date  . COPD (chronic obstructive pulmonary disease)   . GERD (gastroesophageal reflux disease)   . Depression   . Chronic pain syndrome   . MLH1 gene mutation   . Facial droop     "few days ago woke up with slight twisted mouth, went back to sleep and it was gone"  . Asthma     as child  . Shortness of breath   . Arthritis   . Low back pain     constant  . Colon cancer July 2013  . Bladder mass     x3   Past Surgical History  Procedure Laterality Date  . Surgical resection of transverse colon  2013  . Back surgery  2001    x4, lower back  . Cholecystectomy  2013  . Colonoscopy w/ polypectomy  Nov. 2014  . Tonsillectomy  age 20  . Transurethral resection of bladder tumor with gyrus (turbt-gyrus) N/A 10/26/2013    Procedure: TRANSURETHRAL RESECTION OF BLADDER TUMOR WITH GYRUS (TURBT-GYRUS) ;  Surgeon: Mark C Ottelin, MD;  Location: [facility];  Service: Urology;  Laterality: N/A;   family history includes Breast cancer in his maternal grandmother; Colon cancer in his  brother, cousin, maternal uncle, and sister; Colon polyps in his daughter; Diabetes in his father; Stroke in his father. Current Outpatient Prescriptions  Medication Sig Dispense Refill  . buPROPion (WELLBUTRIN SR) 150 MG 12 hr tablet Take 150 mg by mouth daily after lunch.       . escitalopram (LEXAPRO) 20 MG tablet Take 20 mg by mouth every morning.       . esomeprazole (NEXIUM) 40 MG capsule Take 40 mg by mouth 2 (two) times daily before a meal.      . lidocaine (LIDODERM) 5 % Place 1 patch onto the skin daily. Remove & Discard patch within 12 hours or as directed by MD      . lidocaine (LMX) 4 % cream Apply 1 application topically daily as needed. Low back      . methadone (DOLOPHINE) 10 MG tablet Take   10 mg by mouth every 12 (twelve) hours.      . mometasone (NASONEX) 50 MCG/ACT nasal spray Place 2 sprays into the nose daily as needed (congestion).       . oxyCODONE-acetaminophen (PERCOCET) 10-325 MG per tablet Take 1 tablet by mouth every 6 (six) hours as needed for pain.  60 tablet  0  . phenazopyridine (PYRIDIUM) 200 MG tablet Take 1 tablet (200 mg total) by mouth 3 (three) times daily as needed for pain.  30 tablet  0   No current facility-administered medications for this visit.   Allergies as of 11/28/2013 - Review Complete 11/28/2013  Allergen Reaction Noted  . Vicodin [hydrocodone-acetaminophen] Itching 08/28/2013  . Lactose intolerance (gi)  11/28/2013  . Milk-related compounds  08/28/2013  . Clindamycin/lincomycin Rash 10/22/2013    reports that he has been smoking Cigarettes and E-cigarettes.  He has been smoking about 0.00 packs per day for the past 2 years. He has never used smokeless tobacco. He reports that he drinks alcohol. He reports that he does not use illicit drugs.   Review of Systems: Pertinent positive and negative review of systems were noted in the above HPI section. All other review of systems were otherwise negative.  Vital signs were reviewed in today's  medical record Physical Exam: General: Well developed , well nourished, no acute distress Skin: anicteric Head: Normocephalic and atraumatic Eyes:  sclerae anicteric, EOMI Ears: Normal auditory acuity Mouth: No deformity or lesions Neck: Supple, no masses or thyromegaly Lungs: Clear throughout to auscultation Heart: Regular rate and rhythm; no murmurs, rubs or bruits Abdomen: Soft, non tender and non distended. No masses, hepatosplenomegaly or hernias noted. Normal Bowel sounds Rectal:deferred Musculoskeletal: Symmetrical with no gross deformities  Skin: No lesions on visible extremities Pulses:  Normal pulses noted Extremities: No clubbing, cyanosis, edema or deformities noted Neurological: Alert oriented x 4, grossly nonfocal Cervical Nodes:  No significant cervical adenopathy Inguinal Nodes: No significant inguinal adenopathy Psychological:  Alert and cooperative. Normal mood and affect  See Assessment and Plan under Problem List       

## 2013-12-04 NOTE — Op Note (Signed)
[facility] [place], [address]   COLONOSCOPY PROCEDURE REPORT  PATIENT: Todd [name], Todd [name]  MR#: [id number] BIRTHDATE: 1955/10/18 , 57  yrs. old GENDER: Male ENDOSCOPIST: Inda Castle, MD REFERRED TD:SKAJGO Hill, M.D. PROCEDURE DATE:  12/04/2013 PROCEDURE:   Colonoscopy with cold biopsy polypectomy , history of Lynch syndrome ASA CLASS:   Class II INDICATIONS:High risk patient with personal history of colon cancer and High risk patient with personal history of colonic polyps. MEDICATIONS: These medications were titrated to patient response per physician's verbal order, Fentanyl-Detailed 87.5 mcg IV, Versed 8 mg IV, and Benadryl 25 mg IV  DESCRIPTION OF PROCEDURE:   After the risks benefits and alternatives of the procedure were thoroughly explained, informed consent was obtained.       The Pentax Colonoscope [id number] endoscope was introduced through the anus and advanced to the ileum. No adverse events experienced.   The quality of the prep was excellent using Suprep  The instrument was then slowly withdrawn as the colon was fully examined.      COLON FINDINGS: Two sessile polyps measuring 2 mm in size were found in the rectum.  A polypectomy was performed with cold forceps. Mild diverticulosis was noted in the sigmoid colon.   The colon was otherwise normal.  There was no diverticulosis, inflammation, polyps or cancers unless previously stated.   The mucosa appeared normal in the terminal ileum.  Retroflexed views revealed no abnormalities. The time to cecum=minutes 0 seconds.  Withdrawal time=7 minutes 0 seconds.  The scope was withdrawn and the procedure completed. COMPLICATIONS: There were no complications.  ENDOSCOPIC IMPRESSION: 1.   Two sessile polyps measuring 2 mm in size were found in the rectum; polypectomy was performed with cold forceps 2.   Mild diverticulosis was noted in the sigmoid colon 3.   The colon was otherwise  normal 4.   Normal mucosa in the terminal ileum  RECOMMENDATIONS: 1.  yearly colonoscopy    eSigned:  Inda Castle, MD 12/04/2013 8:13 AM   cc:   PATIENT NAME:  Todd [name], Todd [name] MR#: [id number]

## 2013-12-04 NOTE — Telephone Encounter (Signed)
  Follow up Call-  Call back number 12/03/2013  Post procedure Call Back phone  # [phone]  Permission to leave phone message Yes     Patient questions:  Do you have a fever, pain , or abdominal swelling? No. Pain Score  0 *  Have you tolerated food without any problems? Yes.    Have you been able to return to your normal activities? Yes.    Do you have any questions about your discharge instructions: Diet   No. Medications  No. Follow up visit  No.  Do you have questions or concerns about your Care? No.  Actions: * If pain score is 4 or above: No action needed, pain <4.

## 2013-12-05 ENCOUNTER — Encounter: Payer: Self-pay | Admitting: Gastroenterology

## 2013-12-05 ENCOUNTER — Encounter (HOSPITAL_COMMUNITY): Payer: Self-pay | Admitting: Gastroenterology

## 2013-12-07 ENCOUNTER — Ambulatory Visit (HOSPITAL_COMMUNITY): Payer: Medicare HMO

## 2013-12-07 ENCOUNTER — Other Ambulatory Visit: Payer: Self-pay | Admitting: Gastroenterology

## 2013-12-07 ENCOUNTER — Ambulatory Visit (HOSPITAL_COMMUNITY)
Admission: RE | Admit: 2013-12-07 | Discharge: 2013-12-07 | Disposition: A | Discharge status: Home / Self Care | Payer: Medicare HMO | Source: Ambulatory Visit | Attending: Gastroenterology | Admitting: Gastroenterology

## 2013-12-07 DIAGNOSIS — C189 Malignant neoplasm of colon, unspecified: Secondary | ICD-10-CM

## 2013-12-07 DIAGNOSIS — R109 Unspecified abdominal pain: Secondary | ICD-10-CM | POA: Diagnosis not present

## 2013-12-07 MED ORDER — GADOBENATE DIMEGLUMINE 529 MG/ML IV SOLN
20.0000 mL | Freq: Once | INTRAVENOUS | Status: AC | PRN
  Administered 2013-12-07: 20 mL via INTRAVENOUS

## 2013-12-13 ENCOUNTER — Telehealth: Payer: Self-pay

## 2013-12-13 ENCOUNTER — Other Ambulatory Visit: Payer: Self-pay

## 2013-12-13 ENCOUNTER — Encounter (HOSPITAL_COMMUNITY): Payer: Self-pay | Admitting: Pharmacy Technician

## 2013-12-13 DIAGNOSIS — R1013 Epigastric pain: Secondary | ICD-10-CM

## 2013-12-13 NOTE — Telephone Encounter (Signed)
The spouse says she will have the patient call but she knows he is not going to have the ERCP.

## 2013-12-20 NOTE — Telephone Encounter (Signed)
Letter mailed to patient requesting he call about the scheduled ERCP. It is scheduled for 01/01/14 but the patient does not have the prep.

## 2013-12-26 ENCOUNTER — Other Ambulatory Visit: Payer: Self-pay

## 2013-12-26 NOTE — Telephone Encounter (Signed)
I am not getting any response from the patient. I have also tried his phone but it cannot accept voicemail at this time. His release of information states he is the only one who can receive any information about him. Do I cancel the ERCP for now?

## 2013-12-26 NOTE — Telephone Encounter (Signed)
Contacted [facility] endo and cancelled ERCP.

## 2013-12-26 NOTE — Telephone Encounter (Signed)
I think we should cancel it for now

## 2013-12-28 ENCOUNTER — Telehealth: Payer: Self-pay

## 2013-12-28 NOTE — Telephone Encounter (Signed)
Opened encounter in error  

## 2013-12-28 NOTE — Telephone Encounter (Signed)
Spoke with Ms Todd [name] in [facility] of [facility]. Confirmed he is to be at the [facility] 01/01/14 at 9:00 am. Instructed NPO after midnight.Case had been cancelled. Confirmed the time was still available and new orders entered.

## 2013-12-31 NOTE — Anesthesia Preprocedure Evaluation (Addendum)
Anesthesia Evaluation  Patient identified by MRN, date of birth, ID band Patient awake    Reviewed: Allergy & Precautions, H&P , NPO status , Patient's Chart, lab work & pertinent test results  Airway Mallampati: II      Dental  (+) Dental Advisory Given   Pulmonary asthma , COPD COPD inhaler, Current Smoker,  breath sounds clear to auscultation        Cardiovascular Rhythm:Regular Rate:Normal     Neuro/Psych    GI/Hepatic GERD-  Medicated,  Endo/Other    Renal/GU      Musculoskeletal   Abdominal (+)  Abdomen: soft.    Peds  Hematology   Anesthesia Other Findings   Reproductive/Obstetrics                          Anesthesia Physical Anesthesia Plan  ASA: III  Anesthesia Plan: General   Post-op Pain Management:    Induction: Intravenous  Airway Management Planned: Oral ETT  Additional Equipment:   Intra-op Plan:   Post-operative Plan:   Informed Consent: I have reviewed the patients History and Physical, chart, labs and discussed the procedure including the risks, benefits and alternatives for the proposed anesthesia with the patient or authorized representative who has indicated his/her understanding and acceptance.     Plan Discussed with:   Anesthesia Plan Comments: (Check labs in AM)        Anesthesia Quick Evaluation

## 2014-01-01 ENCOUNTER — Other Ambulatory Visit: Payer: Self-pay

## 2014-01-01 ENCOUNTER — Ambulatory Visit (HOSPITAL_COMMUNITY): Admitting: Anesthesiology

## 2014-01-01 ENCOUNTER — Encounter (HOSPITAL_COMMUNITY)
Admission: RE | Disposition: A | Discharge status: Home / Self Care | Payer: Self-pay | Source: Ambulatory Visit | Attending: Gastroenterology

## 2014-01-01 ENCOUNTER — Ambulatory Visit (HOSPITAL_COMMUNITY): Admission: RE | Admit: 2014-01-01 | Payer: Medicare HMO | Source: Ambulatory Visit | Admitting: Gastroenterology

## 2014-01-01 ENCOUNTER — Ambulatory Visit (HOSPITAL_COMMUNITY)

## 2014-01-01 ENCOUNTER — Encounter (HOSPITAL_COMMUNITY): Admission: RE | Payer: Self-pay | Source: Ambulatory Visit

## 2014-01-01 ENCOUNTER — Encounter (HOSPITAL_COMMUNITY): Payer: Self-pay | Admitting: *Deleted

## 2014-01-01 ENCOUNTER — Ambulatory Visit (HOSPITAL_COMMUNITY)
Admission: RE | Admit: 2014-01-01 | Discharge: 2014-01-01 | Disposition: A | Discharge status: Home / Self Care | Source: Ambulatory Visit | Attending: Gastroenterology | Admitting: Gastroenterology

## 2014-01-01 ENCOUNTER — Encounter (HOSPITAL_COMMUNITY): Admitting: Anesthesiology

## 2014-01-01 DIAGNOSIS — Z85038 Personal history of other malignant neoplasm of large intestine: Secondary | ICD-10-CM | POA: Diagnosis not present

## 2014-01-01 DIAGNOSIS — F329 Major depressive disorder, single episode, unspecified: Secondary | ICD-10-CM | POA: Diagnosis not present

## 2014-01-01 DIAGNOSIS — J449 Chronic obstructive pulmonary disease, unspecified: Secondary | ICD-10-CM | POA: Insufficient documentation

## 2014-01-01 DIAGNOSIS — R1013 Epigastric pain: Secondary | ICD-10-CM

## 2014-01-01 DIAGNOSIS — Z9010 Acquired absence of unspecified breast and nipple: Secondary | ICD-10-CM | POA: Insufficient documentation

## 2014-01-01 DIAGNOSIS — Z91011 Allergy to milk products: Secondary | ICD-10-CM | POA: Insufficient documentation

## 2014-01-01 DIAGNOSIS — K8689 Other specified diseases of pancreas: Secondary | ICD-10-CM | POA: Diagnosis not present

## 2014-01-01 DIAGNOSIS — K862 Cyst of pancreas: Secondary | ICD-10-CM

## 2014-01-01 DIAGNOSIS — K219 Gastro-esophageal reflux disease without esophagitis: Secondary | ICD-10-CM | POA: Diagnosis not present

## 2014-01-01 DIAGNOSIS — J4489 Other specified chronic obstructive pulmonary disease: Secondary | ICD-10-CM | POA: Insufficient documentation

## 2014-01-01 DIAGNOSIS — R109 Unspecified abdominal pain: Secondary | ICD-10-CM | POA: Diagnosis present

## 2014-01-01 DIAGNOSIS — G894 Chronic pain syndrome: Secondary | ICD-10-CM | POA: Diagnosis not present

## 2014-01-01 DIAGNOSIS — M129 Arthropathy, unspecified: Secondary | ICD-10-CM | POA: Insufficient documentation

## 2014-01-01 DIAGNOSIS — F3289 Other specified depressive episodes: Secondary | ICD-10-CM | POA: Insufficient documentation

## 2014-01-01 DIAGNOSIS — Z881 Allergy status to other antibiotic agents status: Secondary | ICD-10-CM | POA: Insufficient documentation

## 2014-01-01 DIAGNOSIS — Z885 Allergy status to narcotic agent status: Secondary | ICD-10-CM | POA: Diagnosis not present

## 2014-01-01 DIAGNOSIS — Z79899 Other long term (current) drug therapy: Secondary | ICD-10-CM | POA: Diagnosis not present

## 2014-01-01 DIAGNOSIS — Z4689 Encounter for fitting and adjustment of other specified devices: Secondary | ICD-10-CM | POA: Diagnosis not present

## 2014-01-01 DIAGNOSIS — K863 Pseudocyst of pancreas: Secondary | ICD-10-CM

## 2014-01-01 HISTORY — PX: SPYGLASS CHOLANGIOSCOPY: SHX5441

## 2014-01-01 HISTORY — PX: ERCP: SHX5425

## 2014-01-01 SURGERY — ERCP, WITH INTERVENTION IF INDICATED
Anesthesia: General

## 2014-01-01 MED ORDER — MIDAZOLAM HCL 5 MG/5ML IJ SOLN
INTRAMUSCULAR | Status: DC | PRN
  Administered 2014-01-01: 2 mg via INTRAVENOUS

## 2014-01-01 MED ORDER — FENTANYL CITRATE 0.05 MG/ML IJ SOLN
INTRAMUSCULAR | Status: DC | PRN
  Administered 2014-01-01: 100 ug via INTRAVENOUS

## 2014-01-01 MED ORDER — PHENYLEPHRINE HCL 10 MG/ML IJ SOLN
INTRAMUSCULAR | Status: DC | PRN
  Administered 2014-01-01: 40 ug via INTRAVENOUS
  Administered 2014-01-01: 80 ug via INTRAVENOUS
  Administered 2014-01-01: 40 ug via INTRAVENOUS

## 2014-01-01 MED ORDER — SUCCINYLCHOLINE CHLORIDE 20 MG/ML IJ SOLN
INTRAMUSCULAR | Status: DC | PRN
  Administered 2014-01-01: 100 mg via INTRAVENOUS

## 2014-01-01 MED ORDER — PROPOFOL 10 MG/ML IV BOLUS
INTRAVENOUS | Status: DC | PRN
  Administered 2014-01-01: 200 mg via INTRAVENOUS

## 2014-01-01 MED ORDER — ESMOLOL HCL 10 MG/ML IV SOLN
INTRAVENOUS | Status: DC | PRN
  Administered 2014-01-01: 20 mg via INTRAVENOUS

## 2014-01-01 MED ORDER — LIDOCAINE HCL (CARDIAC) 20 MG/ML IV SOLN
INTRAVENOUS | Status: DC | PRN
  Administered 2014-01-01: 50 mg via INTRAVENOUS

## 2014-01-01 MED ORDER — EPHEDRINE SULFATE 50 MG/ML IJ SOLN
INTRAMUSCULAR | Status: DC | PRN
  Administered 2014-01-01 (×5): 5 mg via INTRAVENOUS

## 2014-01-01 MED ORDER — MEPERIDINE HCL 100 MG/ML IJ SOLN: 6.2500 mg | INTRAMUSCULAR | Status: DC | PRN

## 2014-01-01 MED ORDER — SODIUM CHLORIDE 0.9 % IV SOLN: INTRAVENOUS | Status: DC

## 2014-01-01 MED ORDER — CIPROFLOXACIN IN D5W 400 MG/200ML IV SOLN
400.0000 mg | Freq: Once | INTRAVENOUS | Status: AC
  Administered 2014-01-01: 400 mg via INTRAVENOUS

## 2014-01-01 MED ORDER — LACTATED RINGERS IV SOLN
INTRAVENOUS | Status: DC
  Administered 2014-01-01: 1000 mL via INTRAVENOUS

## 2014-01-01 MED ORDER — CIPROFLOXACIN IN D5W 400 MG/200ML IV SOLN
INTRAVENOUS | Status: AC
  Filled 2014-01-01: qty 200

## 2014-01-01 MED ORDER — FENTANYL CITRATE 0.05 MG/ML IJ SOLN: 25.0000 ug | INTRAMUSCULAR | Status: DC | PRN

## 2014-01-01 MED ORDER — PROMETHAZINE HCL 25 MG/ML IJ SOLN: 6.2500 mg | INTRAMUSCULAR | Status: DC | PRN

## 2014-01-01 NOTE — Anesthesia Postprocedure Evaluation (Signed)
  Anesthesia Post-op Note  Patient: Todd [name]  Procedure(s) Performed: Procedure(s) with comments: ENDOSCOPIC RETROGRADE CHOLANGIOPANCREATOGRAPHY (ERCP) (N/A) - with spyglass - preprocedure ATB's SPYGLASS CHOLANGIOSCOPY (N/A)  Patient Location: PACU  Anesthesia Type:General  Level of Consciousness: awake and alert   Airway and Oxygen Therapy: Patient Spontanous Breathing and Patient connected to nasal cannula oxygen  Post-op Pain: none  Post-op Assessment: Post-op Vital signs reviewed, Patient's Cardiovascular Status Stable, PATIENT'S CARDIOVASCULAR STATUS UNSTABLE and Patent Airway  Post-op Vital Signs: Reviewed and stable  Last Vitals:  Filed Vitals:   01/01/14 1330  BP: 168/91  Pulse: 81  Temp:   Resp: 30    Complications: No apparent anesthesia complications

## 2014-01-01 NOTE — Transfer of Care (Signed)
Immediate Anesthesia Transfer of Care Note  Patient: Todd [name]  Procedure(s) Performed: Procedure(s) with comments: ENDOSCOPIC RETROGRADE CHOLANGIOPANCREATOGRAPHY (ERCP) (N/A) - with spyglass - preprocedure ATB's SPYGLASS CHOLANGIOSCOPY (N/A)  Patient Location: Endo Recovery  Anesthesia Type:General  Level of Consciousness: Patient easily awoken, sedated, comfortable, cooperative, following commands, responds to stimulation.   Airway & Oxygen Therapy: Patient spontaneously breathing, ventilating well, oxygen via simple oxygen mask.  Post-op Assessment: Report given to PACU RN, vital signs reviewed and stable, moving all extremities.   Post vital signs: Reviewed and stable.  Complications: No apparent anesthesia complications

## 2014-01-01 NOTE — Op Note (Signed)
[facility] [place], [address]   ERCP PROCEDURE REPORT        EXAM DATE: 2014/01/16  PATIENT NAME:          Todd [name], Todd [name]          MR #:        [id number]  BIRTHDATE:       20-Aug-1955     VISIT #:     [phone] ATTENDING:     Inda Castle, MD     STATUS:     outpatient ASSISTANT:      Elna Breslow and Mahala Menghini  INDICATIONS:  The patient is a 58 yr old male here for an ERCP due to abdominal pain.  MRCP demonstrates choledochocyst. PROCEDURE PERFORMED:     ERCP with biopsy Endoscopic catheterization of biliary duct MEDICATIONS:     Per Anesthesia and Cipro 400 mg IV  CONSENT: The patient understands the risks and benefits of the procedure and understands that these risks include, but are not limited to: sedation, allergic reaction, infection, perforation and/or bleeding. Alternative means of evaluation and treatment include, among others: physical exam, x-rays, and/or surgical intervention. The patient elects to proceed with this endoscopic procedure.  DESCRIPTION OF PROCEDURE: During intra-op preparation period all mechanical & medical equipment was checked for proper function. Hand hygiene and appropriate measures for infection prevention was taken. After the risks, benefits and alternatives of the procedure were thoroughly explained, Informed was verified, confirmed and timeout was successfully executed by the treatment team. With the patient in left semi-prone position, medications were administered intravenously.The    was passed from the mouth into the esophagus and further advanced from the esophagus into the stomach. From stomach scope was directed to the second portion of the duodenum. Major papilla was aligned with the duodenoscope. The scope position was confirmed fluoroscopically. Rest of the findings/therapeutics are given below. The scope was then completely withdrawn from the patient and the procedure completed.  The pulse, BP, and O2 saturation were monitored and documented by the physician and the nursing staff throughout the entire procedure. The patient was cared for as planned according to standard protocol. The patient was then discharged to recovery in stable condition and with appropriate post procedure care.  D.  the duct was slightly cannulated and was normal throughout.  A #4 4 French 3 cm pigtail pancreatic stent was placed to facilitate cannulation of the common bile duct.  The common bile duct was selectively cannulated.  There was fusiform dilatation throughout the common bile duct.  The common hepatic ducts appeared normal.  A 12 mm sphincterotomy was performed. There was no bleeding.  The common bile duct was examined directly with a choledochoscope (spy glass).  Examination extended from the bifurcation of the common hepatic duct down to the papilla.  Bile duct mucosa was normal.  There were some fringes of loose tissue at the very distal common bile duct which were biopsied.  the pancreatic stent was removed with a polypectomy snare and the scope was withdrawn.    ADVERSE EVENT: IMPRESSIONS:     D. choledochocyst status post endoscopic sphincterotomy  RECOMMENDATIONS:     Await biopsy results REPEAT EXAM:   ___________________________________ Inda Castle, MD eSigned:  Inda Castle, MD 2014-01-16 12:55 PM   cc:  CPT CODES: ICD9 CODES:  The ICD and CPT codes recommended by this software are interpretations from the data that the clinical staff has captured with the software.  The verification of the translation of this report to the ICD and CPT codes and modifiers is the sole responsibility of the health care institution and practicing physician where this report was generated.  [employer] will not be held responsible for the validity of the ICD and CPT codes included on this report.  AMA assumes no liability for data contained or  not contained herein. CPT is a Designer, television/film set of the [employer].   PATIENT NAME:  Todd [name] MR#: [id number]

## 2014-01-01 NOTE — H&P (Signed)
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 For several months he's been complaining of aching midepigastric and periumbilical pain. It is unrelated to eating. He has the sensation of something passing through his intestines. He takes Nexium but has occasional pyrosis. He denies change in bowel habits or rectal bleeding. Colonoscopy over a year ago apparently demonstrated polyps. CT scan in June, which I reviewed, demonstrated biliary dilatation measuring up to 16 mm in the distal common bile duct. LFTs yesterday were normal.  MR CP demonstrated fusiform dilation of the common bile duct Past Medical History   Diagnosis  Date   .  COPD (chronic obstructive pulmonary disease)    .  GERD (gastroesophageal reflux disease)    .  Depression    .  Chronic pain syndrome    .  MLH1 gene mutation    .  Facial droop      "few days ago woke up with slight twisted mouth, went back to sleep and it was gone"   .  Asthma      as child   .  Shortness of breath    .  Arthritis    .  Low back pain      constant   .  Colon cancer  July 2013   .  Bladder mass      x3    Past Surgical History   Procedure  Laterality  Date   .  Surgical resection of transverse colon   2013   .  Back surgery   2001     x4, lower back   .  Cholecystectomy   2013   .  Colonoscopy w/ polypectomy   Nov. 2014   .  Tonsillectomy   age 51   .  Transurethral resection of bladder tumor with gyrus (turbt-gyrus)  N/A  10/26/2013     Procedure: TRANSURETHRAL RESECTION OF BLADDER TUMOR WITH GYRUS (TURBT-GYRUS) ; Surgeon: Claybon Jabs, MD; Location: [facility]; Service: Urology; Laterality: N/A;   family history includes Breast cancer in his maternal grandmother; Colon cancer in his brother, cousin, maternal uncle, and  sister; Colon polyps in his daughter; Diabetes in his father; Stroke in his father.  Current Outpatient Prescriptions   Medication  Sig  Dispense  Refill   .  buPROPion (WELLBUTRIN SR) 150 MG 12 hr tablet  Take 150 mg by mouth daily after lunch.     .  escitalopram (LEXAPRO) 20 MG tablet  Take 20 mg by mouth every morning.     Marland Kitchen  esomeprazole (NEXIUM) 40 MG capsule  Take 40 mg by mouth 2 (two) times daily before a meal.     .  lidocaine (LIDODERM) 5 %  Place 1 patch onto the skin daily. Remove & Discard patch within 12 hours or as directed by MD     .  lidocaine (LMX) 4 % cream  Apply 1 application topically daily as needed. Low back     .  methadone (DOLOPHINE) 10 MG tablet  Take 10 mg by mouth every 12 (twelve) hours.     .  mometasone (NASONEX) 50 MCG/ACT nasal spray  Place 2 sprays into the nose daily as needed (congestion).     Marland Kitchen  oxyCODONE-acetaminophen (PERCOCET) 10-325 MG per tablet  Take 1 tablet by mouth every 6 (six)  hours as needed for pain.  60 tablet  0   .  phenazopyridine (PYRIDIUM) 200 MG tablet  Take 1 tablet (200 mg total) by mouth 3 (three) times daily as needed for pain.  30 tablet  0    No current facility-administered medications for this visit.    Allergies as of 11/28/2013 - Review Complete 11/28/2013   Allergen  Reaction  Noted   .  Vicodin [hydrocodone-acetaminophen]  Itching  08/28/2013   .  Lactose intolerance (gi)   11/28/2013   .  Milk-related compounds   08/28/2013   .  Clindamycin/lincomycin  Rash  10/22/2013   reports that he has been smoking Cigarettes and E-cigarettes. He has been smoking about 0.00 packs per day for the past 2 years. He has never used smokeless tobacco. He reports that he drinks alcohol. He reports that he does not use illicit drugs.  Review of Systems: Pertinent positive and negative review of systems were noted in the above HPI section. All other review of systems were otherwise negative.  Vital signs were reviewed in today's medical  record  Physical Exam:  General: Well developed , well nourished, no acute distress  Skin: anicteric  Head: Normocephalic and atraumatic  Eyes: sclerae anicteric, EOMI  Ears: Normal auditory acuity  Mouth: No deformity or lesions  Neck: Supple, no masses or thyromegaly  Lungs: Clear throughout to auscultation  Heart: Regular rate and rhythm; no murmurs, rubs or bruits  Abdomen: Soft, non tender and non distended. No masses, hepatosplenomegaly or hernias noted. Normal Bowel sounds  Rectal:deferred  Musculoskeletal: Symmetrical with no gross deformities  Skin: No lesions on visible extremities  Pulses: Normal pulses noted  Extremities: No clubbing, cyanosis, edema or deformities noted  Neurological: Alert oriented x 4, grossly nonfocal  Cervical Nodes: No significant cervical adenopathy  Inguinal Nodes: No significant inguinal adenopathy  Psychological: Alert and cooperative. Normal mood and affect   Impression 1.  abdominal pain/abnormal bile duct.  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  Distal bile duct obstruction should be ruled out as well as biliary neoplasm  Plan ERCP with cholangioscopy

## 2014-01-02 ENCOUNTER — Encounter (HOSPITAL_COMMUNITY): Payer: Self-pay | Admitting: Gastroenterology

## 2014-01-08 ENCOUNTER — Encounter: Payer: Commercial Managed Care - HMO | Admitting: Gastroenterology

## 2014-01-25 ENCOUNTER — Other Ambulatory Visit: Payer: Self-pay

## 2014-01-29 ENCOUNTER — Telehealth: Payer: Self-pay | Admitting: Hematology

## 2014-01-29 ENCOUNTER — Other Ambulatory Visit (HOSPITAL_BASED_OUTPATIENT_CLINIC_OR_DEPARTMENT_OTHER): Payer: Commercial Managed Care - HMO

## 2014-01-29 ENCOUNTER — Encounter: Payer: Self-pay | Admitting: Hematology

## 2014-01-29 ENCOUNTER — Ambulatory Visit (HOSPITAL_BASED_OUTPATIENT_CLINIC_OR_DEPARTMENT_OTHER): Payer: Commercial Managed Care - HMO | Admitting: Hematology

## 2014-01-29 VITALS — BP 136/71 | HR 78 | Temp 98.2°F | Resp 18 | Ht 70.0 in | Wt 188.0 lb

## 2014-01-29 DIAGNOSIS — R944 Abnormal results of kidney function studies: Secondary | ICD-10-CM

## 2014-01-29 DIAGNOSIS — Z8551 Personal history of malignant neoplasm of bladder: Secondary | ICD-10-CM

## 2014-01-29 DIAGNOSIS — Z85038 Personal history of other malignant neoplasm of large intestine: Secondary | ICD-10-CM

## 2014-01-29 DIAGNOSIS — F1910 Other psychoactive substance abuse, uncomplicated: Secondary | ICD-10-CM

## 2014-01-29 DIAGNOSIS — Z800 Family history of malignant neoplasm of digestive organs: Secondary | ICD-10-CM

## 2014-01-29 DIAGNOSIS — Z1589 Genetic susceptibility to other disease: Secondary | ICD-10-CM

## 2014-01-29 DIAGNOSIS — J438 Other emphysema: Secondary | ICD-10-CM

## 2014-01-29 DIAGNOSIS — C189 Malignant neoplasm of colon, unspecified: Secondary | ICD-10-CM

## 2014-01-29 DIAGNOSIS — F438 Other reactions to severe stress: Secondary | ICD-10-CM

## 2014-01-29 DIAGNOSIS — Z23 Encounter for immunization: Secondary | ICD-10-CM

## 2014-01-29 LAB — COMPREHENSIVE METABOLIC PANEL (CC13)
ALT: 29 U/L (ref 0–55)
AST: 27 U/L (ref 5–34)
Albumin: 3.2 g/dL — ABNORMAL LOW (ref 3.5–5.0)
Alkaline Phosphatase: 86 U/L (ref 40–150)
Anion Gap: 8 mEq/L (ref 3–11)
BUN: 16.1 mg/dL (ref 7.0–26.0)
CO2: 26 mEq/L (ref 22–29)
Calcium: 9 mg/dL (ref 8.4–10.4)
Chloride: 107 mEq/L (ref 98–109)
Creatinine: 1 mg/dL (ref 0.7–1.3)
Glucose: 93 mg/dl (ref 70–140)
Potassium: 4.1 mEq/L (ref 3.5–5.1)
Sodium: 140 mEq/L (ref 136–145)
Total Bilirubin: 0.36 mg/dL (ref 0.20–1.20)
Total Protein: 6.6 g/dL (ref 6.4–8.3)

## 2014-01-29 LAB — CBC WITH DIFFERENTIAL/PLATELET
BASO%: 0.4 % (ref 0.0–2.0)
Basophils Absolute: 0 10*3/uL (ref 0.0–0.1)
EOS%: 6 % (ref 0.0–7.0)
Eosinophils Absolute: 0.3 10*3/uL (ref 0.0–0.5)
HCT: 37.4 % — ABNORMAL LOW (ref 38.4–49.9)
HGB: 12.3 g/dL — ABNORMAL LOW (ref 13.0–17.1)
LYMPH%: 42.7 % (ref 14.0–49.0)
MCH: 31.5 pg (ref 27.2–33.4)
MCHC: 32.9 g/dL (ref 32.0–36.0)
MCV: 95.7 fL (ref 79.3–98.0)
MONO#: 0.5 10*3/uL (ref 0.1–0.9)
MONO%: 9.2 % (ref 0.0–14.0)
NEUT#: 2.2 10*3/uL (ref 1.5–6.5)
NEUT%: 41.7 % (ref 39.0–75.0)
Platelets: ADEQUATE 10*3/uL (ref 140–400)
RBC: 3.91 10*6/uL — ABNORMAL LOW (ref 4.20–5.82)
RDW: 14.4 % (ref 11.0–14.6)
WBC: 5.2 10*3/uL (ref 4.0–10.3)
lymph#: 2.2 10*3/uL (ref 0.9–3.3)

## 2014-01-29 LAB — LACTATE DEHYDROGENASE (CC13): LDH: 252 U/L — ABNORMAL HIGH (ref 125–245)

## 2014-01-29 LAB — CEA: CEA: 1.7 ng/mL (ref 0.0–5.0)

## 2014-01-29 MED ORDER — INFLUENZA VAC SPLIT QUAD 0.5 ML IM SUSY
0.5000 mL | PREFILLED_SYRINGE | Freq: Once | INTRAMUSCULAR | Status: AC
  Administered 2014-01-29: 0.5 mL via INTRAMUSCULAR
  Filled 2014-01-29: qty 0.5

## 2014-01-29 NOTE — Telephone Encounter (Signed)
Pt confirmed labs/ov per 10/20 POF, gave pt AVS.... KJ °

## 2014-01-29 NOTE — Patient Instructions (Signed)
Influenza Vaccine (Flu Vaccine, Inactivated or Recombinant) 2014-2015: What You Need to Know 1. Why get vaccinated? Influenza ("flu") is a contagious disease that spreads around the [place] every winter, usually between October and May. Flu is caused by influenza viruses, and is spread mainly by coughing, sneezing, and close contact. Anyone can get flu, but the risk of getting flu is highest among children. Symptoms come on suddenly and may last several days. They can include:  fever/chills  sore throat  muscle aches  fatigue  cough  headache  runny or stuffy nose Flu can make some people much sicker than others. These people include young children, people 65 and older, pregnant women, and people with certain health conditions-such as heart, lung or kidney disease, nervous system disorders, or a weakened immune system. Flu vaccination is especially important for these people, and anyone in close contact with them. Flu can also lead to pneumonia, and make existing medical conditions worse. It can cause diarrhea and seizures in children. Each year thousands of people in the [place] die from flu, and many more are hospitalized. Flu vaccine is the best protection against flu and its complications. Flu vaccine also helps prevent spreading flu from person to person. 2. Inactivated and recombinant flu vaccines You are getting an injectable flu vaccine, which is either an "inactivated" or "recombinant" vaccine. These vaccines do not contain any live influenza virus. They are given by injection with a needle, and often called the "flu shot."  A different live, attenuated (weakened) influenza vaccine is sprayed into the nostrils. This vaccine is described in a separate Vaccine Information Statement. Flu vaccination is recommended every year. Some children 6 months through 8 years of age might need two doses during one year. Flu viruses are always changing. Each year's flu vaccine is made  to protect against 3 or 4 viruses that are likely to cause disease that year. Flu vaccine cannot prevent all cases of flu, but it is the best defense against the disease.  It takes about 2 weeks for protection to develop after the vaccination, and protection lasts several months to a year. Some illnesses that are not caused by influenza virus are often mistaken for flu. Flu vaccine will not prevent these illnesses. It can only prevent influenza. Some inactivated flu vaccine contains a very small amount of a mercury-based preservative called thimerosal. Studies have shown that thimerosal in vaccines is not harmful, but flu vaccines that do not contain a preservative are available. 3. Some people should not get this vaccine Tell the person who gives you the vaccine:  If you have any severe, life-threatening allergies. If you ever had a life-threatening allergic reaction after a dose of flu vaccine, or have a severe allergy to any part of this vaccine, including (for example) an allergy to gelatin, antibiotics, or eggs, you may be advised not to get vaccinated. Most, but not all, types of flu vaccine contain a small amount of egg protein.  If you ever had Guillain-Barr Syndrome (a severe paralyzing illness, also called GBS). Some people with a history of GBS should not get this vaccine. This should be discussed with your doctor.  If you are not feeling well. It is usually okay to get flu vaccine when you have a mild illness, but you might be advised to wait until you feel better. You should come back when you are better. 4. Risks of a vaccine reaction With a vaccine, like any medicine, there is a chance of side   effects. These are usually mild and go away on their own. Problems that could happen after any vaccine:  Brief fainting spells can happen after any medical procedure, including vaccination. Sitting or lying down for about 15 minutes can help prevent fainting, and injuries caused by a fall. Tell  your doctor if you feel dizzy, or have vision changes or ringing in the ears.  Severe shoulder pain and reduced range of motion in the arm where a shot was given can happen, very rarely, after a vaccination.  Severe allergic reactions from a vaccine are very rare, estimated at less than 1 in a million doses. If one were to occur, it would usually be within a few minutes to a few hours after the vaccination. Mild problems following inactivated flu vaccine:  soreness, redness, or swelling where the shot was given  hoarseness  sore, red or itchy eyes  cough  fever  aches  headache  itching  fatigue If these problems occur, they usually begin soon after the shot and last 1 or 2 days. Moderate problems following inactivated flu vaccine:  Young children who get inactivated flu vaccine and pneumococcal vaccine (PCV13) at the same time may be at increased risk for seizures caused by fever. Ask your doctor for more information. Tell your doctor if a child who is getting flu vaccine has ever had a seizure. Inactivated flu vaccine does not contain live flu virus, so you cannot get the flu from this vaccine. As with any medicine, there is a very remote chance of a vaccine causing a serious injury or death. The safety of vaccines is always being monitored. For more information, visit: www.cdc.gov/vaccinesafety/ 5. What if there is a serious reaction? What should I look for?  Look for anything that concerns you, such as signs of a severe allergic reaction, very high fever, or behavior changes. Signs of a severe allergic reaction can include hives, swelling of the face and throat, difficulty breathing, a fast heartbeat, dizziness, and weakness. These would start a few minutes to a few hours after the vaccination. What should I do?  If you think it is a severe allergic reaction or other emergency that can't wait, call 9-1-1 and get the person to the nearest hospital. Otherwise, call your  doctor.  Afterward, the reaction should be reported to the Vaccine Adverse Event Reporting System (VAERS). Your doctor should file this report, or you can do it yourself through the VAERS web site at www.vaers.hhs.gov, or by calling 1-800-822-7967. VAERS does not give medical advice. 6. The National Vaccine Injury Compensation Program The National Vaccine Injury Compensation Program (VICP) is a federal program that was created to compensate people who may have been injured by certain vaccines. Persons who believe they may have been injured by a vaccine can learn about the program and about filing a claim by calling 1-800-338-2382 or visiting the VICP website at www.hrsa.gov/vaccinecompensation. There is a time limit to file a claim for compensation. 7. How can I learn more?  Ask your health care provider.  Call your local or state health department.  Contact the Centers for Disease Control and Prevention (CDC):  Call 1-800-232-4636 (1-800-CDC-INFO) or  Visit CDC's website at www.cdc.gov/flu CDC Vaccine Information Statement (Interim) Inactivated Influenza Vaccine (11/28/2012) Document Released: 01/21/2006 Document Revised: 08/13/2013 Document Reviewed: 03/16/2013 [employer] Patient Information 2015 [employer]. This information is not intended to replace advice given to you by your health care provider. Make sure you discuss any questions you have with your health   care provider.  

## 2014-01-29 NOTE — Progress Notes (Signed)
[place] [facility] OFFICE PROGRESS NOTE DATE OF VISIT: 01/29/2014  Todd Font, MD [address] [place] [address]  DIAGNOSIS: 1. Colon cancer - Plan: CBC with Differential, Comprehensive metabolic panel (Cmet) - [facility], CEA, Influenza vac split quadrivalent PF (FLUARIX) injection 0.5 mL 2. 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 Follow-up    CURRENT TREATMENT:   1. Colon cancer: Surveillance with labs and CEA and colonoscopic monitoring. 2. Bladder Cancer: Surveillance and cystoscopy every 3 months  INTERVAL HISTORY:  Todd [name] 58 y.o. male who is being referred by Dr. Jeanie [name] for follow up regarding his history of colon cancer and newly diagnosed TCC s/p TURBT (10/26/2013). Last seen by Dr Todd [name] on 11/27/2013. He is currently in prison and came today with two prison guards, no family member.   He underwent transurethral resection of bladder tumor 10/26/13 by Dr. Karsten [name]. Small bladder perforation identified due to obturator reflex therefore has been observed with a foley. By 7/18, the day of discharge, pt ambulatory, pain manageable in PO meds, tolerating regular diet, foley clear and felt to be adequate for discharge. He continued to have an occasional bladder spasm. He has a follow up with Dr. Karsten [name] on 07/27. He is on ditropan 5 mg every 8 hours for bladder spams.  He was also started on pyrdium 200 mg tid as needed for pain.  He take detrol LA 4 mg daily.   He was also on ciprofloxacin 250 mg bid for 7 days since 10/26/2013.  His pain was managed partially with  roxicet 1-2 tabs every 4 hours and he is on methadone 10 mg bid chronically. Patient is supposed to followup with urology this month for a cystoscopy.   He on longstanding pain medications due to back surgeries x 4.  He has been on narcotics for since 2002.  He first started methadone in 2014. It was started  by Todd [name] of family practice out of [place].  He was on 180 percocet monthly prior to this. He also notes a sensation of fluid in the ears, memory lost. He is being followed by Dr. Iona [name] of the [facility] clinic.  He is scheduled to see Dr. Berdine [name] on the 12/13/2013.    Patient was recently incarcerated and his release date is on 03/11/2014 today therefore him to another facility in [place]. Today when he came to the clinic he was hand cuffed and with 2 guards the he denies any change in his bowel habits he did have an admission in September for pancreatitis and pancreatic biopsy was done on 01/01/2014 showing reactive ductal epithelial cells with underlying stromal fibrosis. No evidence of malignancy. I reviewed his family history which is updated below. On patient's request I also called his wife came at [phone] and updated her about his condition. Will continue every 3 months checkup on him.    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 He underwent surgical resection of transverse colon in [place], [place] (Dr. Conni [name]). Tumor was staged as T3N0M0. However, only six lymph nodes were identified in the surgical specimen. The patient also has a strong family history of colon cancer and colon polyposis and underwent  testing for Lynch syndrome. 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   He was offered adjuvant chemotherapy with 5-FU leucovorin after his surgery; however, he stopped chemotherapy after only two cycles for adverse side effects. The patient was seen by Todd [name] in October 2014. At that time, he was complaining of non-specific abdominal pain and bloating. A CT scan of chest, abdomen, and pelvis done in September 2014 showed two small lesions in the liver, which  appeared to be unchanged compared with previous Ct scans done in 2013 in [place]. He was also noted to have two small lesions in the lung of undetermined etiology. These could not be identified on previous CT scans done because that area was not included in the CT scans. He had CT of abdomen and pelvis on 05/08/2011 with no evidence of metastatic disease in the abdomen or pelvis. There was mild narrowing at apex of redundant sigmoid colon, most likely normal variation. There was 7 mm benign appearing lesion in the upper right lobe of liver. He also had an MRI of brain (05/07/2013) which demonstrated no evidence of brain metastasis. CT of chest was without evidence of metastatic disease in the chest. He did have have pulmonary emphysema in the upper lung zones. There was a 7 mm benign cyst in the upper right lobe of liver. Last colonoscopy was 01/19/2013 which demonstrated done at [place] [facility] Todd Kaw, MD. Biopsy was taken with removal of one polyp. He had labs done on 04/24/2013 which revealed a normal WBC, Hgb and plt. His CEA was 1.2.   He was followed by Dr. Arvind [name] of [facility] with last visit on 04/24/2013 ([place], [place]). He was also seen by Dr. Avbuere of [facility] on 08/09/2013 to reestablish a PCP relation since moving forml [place]. Of note, he was also referred to [facility] for pain management. He has chronic lower back pain and is on the following: Methadone 10 mg one tab every 12 hours as needed; oxycodone-acetaminophen 10-325 mg one tab daily; buproprion HCL 150 mg daily. He reports being disabled for 12 years. He reports having a back surgery in 1998, 2 in 1999 and the last one was in 2002. He reports the L5-S1 ruptured secondary to injury and had subsequent collapse requiring fusion. 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 He moved back to [place] from [place] (he stayed there nearly one year) to help care for his elderly mother and  father. He report two brothers with colon cancer (age 40 at diagnosis, died at age 44); sister, (age 34) and brother (age 53). He also reports maternal uncles and their children having multiple cancers. His mother who is still living has had male cancers. He has two sons. His oldest son is in his early forties Chris not tested yet (he has spoken with his mother). He has a son name Ardit, age 30. He has four daughters, (ages 40--[name], 36--[name], 38--[name] (not carrier), 33--paula). [name] and patrice had colonoscopies and had removal of polyps. [name] is a carrier for the lynch syndrome and she has two kids ages 17 and 14. He is unsure of his other children lynch syndrome status.   He also reports right lateral leg a knot that is sore to touch over the past 2 months. He also notes one in his groin over the past 6 months. He notes no increase in size recently. He denies fevers or chills. He notes weight lost of 12   lbs from 222 lbs in January to 210 lbs. He denies difficulty swallowing or chewing. He denies alcohol use. He smokes 3-4 cigarettes daily down from one pack per day. He has smoked for over 30 years.   MEDICAL HISTORY: Past Medical History  Diagnosis Date  . COPD (chronic obstructive pulmonary disease)   . GERD (gastroesophageal reflux disease)   . Depression   . Chronic pain syndrome   . MLH1 gene mutation   . Facial droop     "few days ago woke up with slight twisted mouth, went back to sleep and it was gone"  . Asthma     as child  . Shortness of breath   . Arthritis   . Low back pain     constant  . Colon cancer July 2013  . Bladder mass     x3    INTERIM HISTORY: has History of colon cancer; COPD (chronic obstructive pulmonary disease); GERD (gastroesophageal reflux disease); Depression; Chronic pain syndrome; MLH1 gene mutation; Testicular pain, right; Bladder tumor; Hip pain, bilateral; Abdominal pain, periumbilic; Benign neoplasm of colon; and Diverticulosis of  colon (without mention of hemorrhage) on his problem list.    ALLERGIES:  is allergic to clindamycin/lincomycin; milk-related compounds; vicodin; and lactose intolerance (gi).  MEDICATIONS: has a current medication list which includes the following prescription(s): albuterol, budesonide-formoterol, escitalopram, esomeprazole, lidocaine, lidocaine, methadone, mometasone, and oxycodone-acetaminophen.  SURGICAL HISTORY:  Past Surgical History  Procedure Laterality Date  . Surgical resection of transverse colon  2013  . Back surgery  2001    x4, lower back  . Cholecystectomy  2013  . Colonoscopy w/ polypectomy  Nov. 2014  . Tonsillectomy  age 20  . Transurethral resection of bladder tumor with gyrus (turbt-gyrus) N/A 10/26/2013    Procedure: TRANSURETHRAL RESECTION OF BLADDER TUMOR WITH GYRUS (TURBT-GYRUS) ;  Surgeon: Mark C Ottelin, MD;  Location: [facility];  Service: Urology;  Laterality: N/A;  . Colonoscopy N/A 12/04/2013    Procedure: COLONOSCOPY;  Surgeon: Robert D Kaplan, MD;  Location: [facility];  Service: Endoscopy;  Laterality: N/A;  . Ercp N/A 01/01/2014    Procedure: ENDOSCOPIC RETROGRADE CHOLANGIOPANCREATOGRAPHY (ERCP);  Surgeon: Robert D Kaplan, MD;  Location: [facility];  Service: Endoscopy;  Laterality: N/A;  with spyglass - preprocedure ATB's  . Spyglass cholangioscopy N/A 01/01/2014    Procedure: SPYGLASS CHOLANGIOSCOPY;  Surgeon: Robert D Kaplan, MD;  Location: [facility];  Service: Endoscopy;  Laterality: N/A;    REVIEW OF SYSTEMS:   Constitutional: Denies fevers, chills or abnormal weight loss Eyes: Denies blurriness of vision Ears, nose, mouth, throat, and face: Denies mucositis or sore throat Respiratory: Denies cough, dyspnea or wheezes Cardiovascular: Denies palpitation, chest discomfort or lower extremity swelling Gastrointestinal:  Denies nausea, heartburn or change in bowel habits Skin: Denies abnormal skin rashes Lymphatics: Denies new lymphadenopathy or easy  bruising Neurological:Denies numbness, tingling or new weaknesses Behavioral/Psych: Mood is stable, no new changes  All other systems were reviewed with the patient and are negative.  PHYSICAL EXAMINATION: ECOG PERFORMANCE STATUS: 0  Blood pressure 136/71, pulse 78, temperature 98.2 F (36.8 C), temperature source Oral, resp. rate 18, height 5' 10" (1.778 m), weight 188 lb (85.276 kg), SpO2 98.00%.  GENERAL:alert, no distress and comfortable; sitting in wheel chair.  Foley in place.  SKIN: skin color, texture, turgor are normal, no rashes or significant lesions EYES: normal, Conjunctiva are pink and non-injected, sclera clear OROPHARYNX:no exudate, no erythema and lips, buccal mucosa, and tongue normal  NECK:   supple, thyroid normal size, non-tender, without nodularity LYMPH:  no palpable lymphadenopathy in the cervical, axillary or supraclavicular LUNGS: clear to auscultation with normal breathing effort, no wheezes or rhonchi HEART: regular rate & rhythm and no murmurs and no lower extremity edema ABDOMEN:abdomen soft, non-tender and normal bowel sounds Musculoskeletal:no cyanosis of digits and no clubbing  NEURO: alert & oriented x 3 with fluent speech, no focal motor/sensory deficits; unable to complete straight leg test due to the pain.    Labs:  Lab Results  Component Value Date   WBC 5.2 01/29/2014   HGB 12.3* 01/29/2014   HCT 37.4* 01/29/2014   MCV 95.7 01/29/2014   PLT Clumped Platelets--Appears Adequate 01/29/2014   NEUTROABS 2.2 01/29/2014      Chemistry      Component Value Date/Time   NA 140 01/29/2014 0937   NA 139 10/22/2013 1135   K 4.1 01/29/2014 0937   K 4.1 10/22/2013 1135   CL 101 10/22/2013 1135   CO2 26 01/29/2014 0937   CO2 25 10/22/2013 1135   BUN 16.1 01/29/2014 0937   BUN 11 10/22/2013 1135   CREATININE 1.0 01/29/2014 0937   CREATININE 1.46* 10/22/2013 1135      Component Value Date/Time   CALCIUM 9.0 01/29/2014 0937   CALCIUM 9.2 10/22/2013 1135     ALKPHOS 86 01/29/2014 0937   ALKPHOS 67 09/24/2013 1622   AST 27 01/29/2014 0937   AST 15 09/24/2013 1622   ALT 29 01/29/2014 0937   ALT 19 09/24/2013 1622   BILITOT 0.36 01/29/2014 0937   BILITOT 0.6 09/24/2013 1622       Basic Metabolic Panel:  Recent Labs Lab 01/29/14 0937  NA 140  K 4.1  CO2 26  GLUCOSE 93  BUN 16.1  CREATININE 1.0  CALCIUM 9.0   GFR Estimated Creatinine Clearance: 84.2 ml/min (by C-G formula based on Cr of 1). Liver Function Tests:  Recent Labs Lab 01/29/14 0937  AST 27  ALT 29  ALKPHOS 86  BILITOT 0.36  PROT 6.6  ALBUMIN 3.2*   No results found for this basename: LIPASE, AMYLASE,  in the last 168 hours No results found for this basename: AMMONIA,  in the last 168 hours Coagulation profile No results found for this basename: INR, PROTIME,  in the last 168 hours  CBC:  Recent Labs Lab 01/29/14 0933  WBC 5.2  NEUTROABS 2.2  HGB 12.3*  HCT 37.4*  MCV 95.7  PLT Clumped Platelets--Appears Adequate       RADIOGRAPHIC STUDIES: None  PATHOLOGY:  Colon cancer  Location: Transverse colon  Date of Diagnosis: 09/2011  TNM staging: T3, N0, M0, staging pathologic.  Node positive disease: Number of lymph nodes removed: 6, Number of positive lymph nodes: 0.  Stage at diagnosis: IIA  Histology: adenocarcinoma. Histologic grade: Grade: 1. KRAS gene: unknown. BRAF gene: unknown. NRAS gene: unknown. Microsatellite instability (MSI): unknown. EGFR expression: unknown. Lynch syndrome testing: MLH1: present, MSH2: absent, MSH6:absenst, PMS2: absent.   ASSESSMENT: Todd [name] 58 y.o. male with a history of Colon cancer - Plan: CBC with Differential, Comprehensive metabolic panel (Cmet) - [facility], CEA, Influenza vac split quadrivalent PF (FLUARIX) injection 0.5 mL   PLAN:   1. TCC s/p TURBT (10/22/2013). --Patient has follow up with Dr. Karsten [name]. Final pathology as noted above.  He will call his office for further management of his pain and/or  consideration of local anesthetics as needed. Cystoscopy every 3 months and a visit scheduled this month.  3. Colon Cancer, Stage IIA.  --He completed 2 cycles of adjuvant chemotherapy following resection. He is now on active surveillance with his last scans done January of 2015. He had CT of chest/abdomen/pelvis and MRI of the brain. All were negative of metastatic disease. He does have a 7 mm lung nodule as noted below which was felt to be benign. We recommended to continue follow up with labs and CEA and doctor's visits every 3-6 months for first 2 years then 6- 12 months for years 3-5 per NCCN guidelines. We also recommended at minimal annual scans for the first 5 years. We will consider scanning based on clinical symptoms as well and in consideration of his family and genetic history as noted in #3. His last colonoscopy was 01/19/2013 which demonstrated done at [facility] Todd Kaw, MD. Biopsy was taken with removal of one polyp. He had labs done today with normal CBC and CMET. CEA 1.7 today.    --He has an appointment with Dr. Kaplan on 11/27/2013.    4. Lynch syndrome.  --He has a strong family history of colon cancer and colon polyposis and underwent testing for Lynch syndrome. 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 He will follow up with his GI for colonoscopy as warranted by GI. He saw Dr. Kaplan on 11/27/2013 and then had a MRCP and ERCP done. Subsequent pancraetic biopsy was negative for cancer but he did develop procedure related pancreatitis and was seen at [facility] [place], [place] twice this month.    5. 7 mm benign cyst in the upper right lobe of liver.   6. Pulmonary emphysema/Tobacco abuse.  --Patient counseled to stop smoking but declined tobacco cessation classes.   7. Elevated Creatinine. --His creatinine was 1.4 now down to normal 1.0.   8. Follow up.  --We  recommend that he follow up in 3 months with CBC, Chemistries and CEA. He did get a flu shot in office today.  All questions were answered. The patient knows to call the clinic with any problems, questions or concerns. We can certainly see the patient much sooner if necessary.  I spent 15 minutes counseling the patient face to face. The total time spent in the appointment was 25 minutes.     , MD Medical Hematologist/Oncologist [facility] Pager: [phone] Office No: [phone] 

## 2014-01-30 ENCOUNTER — Telehealth: Payer: Self-pay | Admitting: *Deleted

## 2014-01-30 NOTE — Telephone Encounter (Signed)
Dr Deatra Ina has this patient scheduled on 03/19/2014   This should not be on this for the hospital. Dr Deatra Ina is [place] all day

## 2014-02-07 NOTE — Telephone Encounter (Signed)
Patient had his colonoscopy already in August,  Per report does not need another colonoscopy for 1 year

## 2014-03-19 ENCOUNTER — Ambulatory Visit (HOSPITAL_COMMUNITY): Admission: RE | Admit: 2014-03-19 | Payer: Medicare HMO | Source: Ambulatory Visit | Admitting: Gastroenterology

## 2014-03-19 ENCOUNTER — Encounter (HOSPITAL_COMMUNITY): Admission: RE | Payer: Self-pay | Source: Ambulatory Visit

## 2014-03-19 SURGERY — COLONOSCOPY
Anesthesia: Moderate Sedation

## 2014-04-25 ENCOUNTER — Encounter (HOSPITAL_COMMUNITY): Payer: Self-pay | Admitting: Gastroenterology

## 2014-04-27 ENCOUNTER — Telehealth: Payer: Self-pay | Admitting: Oncology

## 2014-04-27 NOTE — Telephone Encounter (Signed)
, °

## 2014-05-06 ENCOUNTER — Other Ambulatory Visit: Payer: PRIVATE HEALTH INSURANCE

## 2014-05-06 ENCOUNTER — Ambulatory Visit: Payer: PRIVATE HEALTH INSURANCE

## 2014-05-16 ENCOUNTER — Other Ambulatory Visit: Payer: Self-pay | Admitting: *Deleted

## 2014-05-16 DIAGNOSIS — Z85038 Personal history of other malignant neoplasm of large intestine: Secondary | ICD-10-CM

## 2014-05-17 ENCOUNTER — Telehealth: Payer: Self-pay | Admitting: Oncology

## 2014-05-17 ENCOUNTER — Other Ambulatory Visit (HOSPITAL_BASED_OUTPATIENT_CLINIC_OR_DEPARTMENT_OTHER): Payer: Commercial Managed Care - HMO

## 2014-05-17 ENCOUNTER — Other Ambulatory Visit: Payer: Self-pay | Admitting: *Deleted

## 2014-05-17 ENCOUNTER — Other Ambulatory Visit: Payer: Self-pay | Admitting: Urology

## 2014-05-17 ENCOUNTER — Ambulatory Visit (HOSPITAL_BASED_OUTPATIENT_CLINIC_OR_DEPARTMENT_OTHER): Payer: Commercial Managed Care - HMO | Admitting: Oncology

## 2014-05-17 VITALS — BP 136/75 | HR 81 | Temp 98.0°F | Resp 18 | Ht 70.0 in | Wt 192.4 lb

## 2014-05-17 DIAGNOSIS — M549 Dorsalgia, unspecified: Secondary | ICD-10-CM

## 2014-05-17 DIAGNOSIS — Z85038 Personal history of other malignant neoplasm of large intestine: Secondary | ICD-10-CM

## 2014-05-17 DIAGNOSIS — Z800 Family history of malignant neoplasm of digestive organs: Secondary | ICD-10-CM

## 2014-05-17 DIAGNOSIS — R109 Unspecified abdominal pain: Secondary | ICD-10-CM

## 2014-05-17 DIAGNOSIS — Z1589 Genetic susceptibility to other disease: Secondary | ICD-10-CM

## 2014-05-17 DIAGNOSIS — Z8551 Personal history of malignant neoplasm of bladder: Secondary | ICD-10-CM

## 2014-05-17 MED ORDER — OXYCODONE-ACETAMINOPHEN 10-325 MG PO TABS: 1.0000 | ORAL_TABLET | Freq: Four times a day (QID) | ORAL | Status: DC | PRN

## 2014-05-17 NOTE — Telephone Encounter (Signed)
Confirm appointment for August. Mailed calendar. See referral notes.

## 2014-05-17 NOTE — Progress Notes (Signed)
  [place] OFFICE PROGRESS NOTE   Diagnosis: Colon cancer, hereditary non-polyposis colon cancer syndrome  INTERVAL HISTORY:   Todd [name] returns for scheduled visit. He has been followed at the [facility] after being diagnosed with colon cancer in June 2013. He was diagnosed with hereditary non-polyposis colon cancer syndrome.  He last underwent a colonoscopy in August 2015. Rectum polyp biopsies returned as a hyperplastic polyp and polypoid fragments of benign colonic mucosa. No adenomatous change or malignancy. He underwent an ERCP 01/01/2014 to evaluate a dilated bile duct. A biopsy from the pancreas revealed no evidence of malignancy. He was diagnosed with a choledochocyst.  He reports being diagnosed with "pancreatitis "following this procedure. I do not have records available regarding a diagnosis of pancreatic eyes.  He was diagnosed with a low-grade papillary urothelial carcinoma by Dr. Karsten Ro in July 2015. He reports undergoing a surveillance cystoscopy yesterday and was noted to have 2 bladder masses. He is scheduled for a follow-up cystoscopy.  Todd [name] has chronic back pain treated with methadone and Percocet. He complains of increased abdominal pain recently. He has frequent belching. His bowel habits are regular.  Review of systems: Abdominal pain, pain with sex, nausea, irregular bowel habits, pain with urination Objective:  Vital signs in last 24 hours:  Blood pressure 136/75, pulse 81, temperature 98 F (36.7 C), temperature source Oral, resp. rate 18, height _0  (1.778 m), weight 192 lb 6.4 oz (87.272 kg), SpO2 99 %.    HEENT: Oropharynx without visible mass, neck without mass Lymphatics: 0.5 cm right posterior cervical node, no other cervical, supra-clavicular, axillary, or inguinal nodes Resp: Distant breath sounds, clear bilaterally Cardio: Reglan rate and rhythm GI: The abdomen is soft, no hepatosplenomegaly, no mass in the  nontender Vascular: No leg edema       Lab Results:   Lab Results  Component Value Date   CEA 1.7 01/29/2014    Imaging:  No results found.  Medications: I have reviewed the patient's current medications.  Assessment/Plan:  1. History of stage II colon cancer diagnosed in unit 2013 (T3 N0), status post 2 cycles of adjuvant" chemotherapy "  2. Hereditary non-polyposis colon cancer syndrome, MLH1 deleterious mutation  3. Multiple family members with colon cancer including 2 brothers and his sister. One brother died of colon cancer at age 34. He reports 3 of his 6 children have been diagnosed with hereditary non-polyposis colon cancer syndrome  4. Chronic pain syndrome-back pain  5. Noninvasive low-grade papillary urothelial carcinoma, TURBT by Dr.Ottelin July 2015   Repeat cystoscopy 05/16/2014  6. COPD  7. Small right posterior cervical node on exam 05/17/2014  Disposition:  Todd [name] remains in clinical remission from colon cancer. He has chronic abdomen and back pain. His narcotics were prescribed by Dr. Berdine Addison. He reports being out of narcotics today. I gave him a prescription for 10 Percocet tablets. He will contact Dr. Berdine Addison for pain management.   We will refer him back to Dr. Deatra Ina to be scheduled for a surveillance colonoscopy and to evaluate the persistent abdominal pain.  I will follow-up on the cystoscopy report from Dr.  Karsten Ro.   We checked a CEA today. Todd [name] will return to the medical oncology clinic in 6 months. We will see him sooner as needed pending the cystoscopy findings.  Betsy Coder, MD  05/17/2014  10:31 AM

## 2014-05-18 LAB — CEA: CEA: 1.1 ng/mL (ref 0.0–5.0)

## 2014-05-22 ENCOUNTER — Encounter (HOSPITAL_BASED_OUTPATIENT_CLINIC_OR_DEPARTMENT_OTHER): Payer: Self-pay | Admitting: *Deleted

## 2014-05-23 ENCOUNTER — Encounter (HOSPITAL_BASED_OUTPATIENT_CLINIC_OR_DEPARTMENT_OTHER): Payer: Self-pay | Admitting: *Deleted

## 2014-05-23 NOTE — Progress Notes (Signed)
NPO AFTER MN. ARRIVE AT 1015. NEEDS HG. CURRENT CXR IN CHART AND EPIC. WILL TAKE AM MEDS AND DO SYMBICORT INHALER DOS W/ SIPS OF WATER.

## 2014-05-27 ENCOUNTER — Encounter (HOSPITAL_BASED_OUTPATIENT_CLINIC_OR_DEPARTMENT_OTHER)
Admission: RE | Disposition: A | Discharge status: Home / Self Care | Payer: Self-pay | Source: Ambulatory Visit | Attending: Urology

## 2014-05-27 ENCOUNTER — Encounter (HOSPITAL_BASED_OUTPATIENT_CLINIC_OR_DEPARTMENT_OTHER): Payer: Self-pay | Admitting: Anesthesiology

## 2014-05-27 ENCOUNTER — Ambulatory Visit (HOSPITAL_BASED_OUTPATIENT_CLINIC_OR_DEPARTMENT_OTHER)
Admission: RE | Admit: 2014-05-27 | Discharge: 2014-05-27 | Disposition: A | Discharge status: Home / Self Care | Payer: Commercial Managed Care - HMO | Source: Ambulatory Visit | Attending: Urology | Admitting: Urology

## 2014-05-27 ENCOUNTER — Ambulatory Visit (HOSPITAL_BASED_OUTPATIENT_CLINIC_OR_DEPARTMENT_OTHER): Payer: Commercial Managed Care - HMO | Admitting: Anesthesiology

## 2014-05-27 DIAGNOSIS — F329 Major depressive disorder, single episode, unspecified: Secondary | ICD-10-CM | POA: Diagnosis not present

## 2014-05-27 DIAGNOSIS — Z8551 Personal history of malignant neoplasm of bladder: Secondary | ICD-10-CM | POA: Insufficient documentation

## 2014-05-27 DIAGNOSIS — Z85038 Personal history of other malignant neoplasm of large intestine: Secondary | ICD-10-CM | POA: Insufficient documentation

## 2014-05-27 DIAGNOSIS — Z79899 Other long term (current) drug therapy: Secondary | ICD-10-CM | POA: Insufficient documentation

## 2014-05-27 DIAGNOSIS — K219 Gastro-esophageal reflux disease without esophagitis: Secondary | ICD-10-CM | POA: Insufficient documentation

## 2014-05-27 DIAGNOSIS — D494 Neoplasm of unspecified behavior of bladder: Secondary | ICD-10-CM

## 2014-05-27 DIAGNOSIS — F17210 Nicotine dependence, cigarettes, uncomplicated: Secondary | ICD-10-CM | POA: Insufficient documentation

## 2014-05-27 DIAGNOSIS — Z79891 Long term (current) use of opiate analgesic: Secondary | ICD-10-CM | POA: Diagnosis not present

## 2014-05-27 DIAGNOSIS — J449 Chronic obstructive pulmonary disease, unspecified: Secondary | ICD-10-CM | POA: Diagnosis not present

## 2014-05-27 DIAGNOSIS — C679 Malignant neoplasm of bladder, unspecified: Secondary | ICD-10-CM | POA: Insufficient documentation

## 2014-05-27 DIAGNOSIS — N508 Other specified disorders of male genital organs: Secondary | ICD-10-CM | POA: Diagnosis present

## 2014-05-27 HISTORY — DX: Dysuria: R30.0

## 2014-05-27 HISTORY — DX: Presence of spectacles and contact lenses: Z97.3

## 2014-05-27 HISTORY — DX: Personal history of other malignant neoplasm of large intestine: Z85.038

## 2014-05-27 HISTORY — PX: TRANSURETHRAL RESECTION OF BLADDER TUMOR WITH GYRUS (TURBT-GYRUS): SHX6458

## 2014-05-27 HISTORY — DX: Low back pain, unspecified: M54.50

## 2014-05-27 HISTORY — DX: Personal history of other diseases of the digestive system: Z87.19

## 2014-05-27 HISTORY — DX: Other chronic pain: G89.29

## 2014-05-27 HISTORY — DX: Lactose intolerance, unspecified: E73.9

## 2014-05-27 HISTORY — DX: Low back pain: M54.5

## 2014-05-27 HISTORY — DX: Personal history of malignant neoplasm of bladder: Z85.51

## 2014-05-27 HISTORY — DX: Shortness of breath: R06.02

## 2014-05-27 LAB — POCT HEMOGLOBIN-HEMACUE: Hemoglobin: 16 g/dL (ref 13.0–17.0)

## 2014-05-27 SURGERY — TRANSURETHRAL RESECTION OF BLADDER TUMOR WITH GYRUS (TURBT-GYRUS)
Anesthesia: General | Site: Bladder

## 2014-05-27 MED ORDER — FENTANYL CITRATE 0.05 MG/ML IJ SOLN
INTRAMUSCULAR | Status: DC | PRN
  Administered 2014-05-27: 25 ug via INTRAVENOUS
  Administered 2014-05-27: 50 ug via INTRAVENOUS
  Administered 2014-05-27: 25 ug via INTRAVENOUS

## 2014-05-27 MED ORDER — OXYCODONE HCL 10 MG PO TABS: 10.0000 mg | ORAL_TABLET | ORAL | Status: DC | PRN

## 2014-05-27 MED ORDER — STERILE WATER FOR IRRIGATION IR SOLN
Status: DC | PRN
  Administered 2014-05-27: 3000 mL

## 2014-05-27 MED ORDER — MIDAZOLAM HCL 5 MG/5ML IJ SOLN
INTRAMUSCULAR | Status: DC | PRN
  Administered 2014-05-27: 2 mg via INTRAVENOUS

## 2014-05-27 MED ORDER — OXYCODONE HCL 5 MG PO TABS
10.0000 mg | ORAL_TABLET | ORAL | Status: DC | PRN
  Administered 2014-05-27: 10 mg via ORAL
  Filled 2014-05-27: qty 2

## 2014-05-27 MED ORDER — PROMETHAZINE HCL 25 MG/ML IJ SOLN
6.2500 mg | INTRAMUSCULAR | Status: DC | PRN
Start: 2014-05-27 — End: 2014-05-27
  Filled 2014-05-27: qty 1

## 2014-05-27 MED ORDER — PHENAZOPYRIDINE HCL 100 MG PO TABS
ORAL_TABLET | ORAL | Status: AC
Start: 2014-05-27 — End: 2014-05-27
  Filled 2014-05-27: qty 2

## 2014-05-27 MED ORDER — MIDAZOLAM HCL 2 MG/2ML IJ SOLN
INTRAMUSCULAR | Status: AC
  Filled 2014-05-27: qty 2

## 2014-05-27 MED ORDER — TAMSULOSIN HCL 0.4 MG PO CAPS
ORAL_CAPSULE | ORAL | Status: AC
  Filled 2014-05-27: qty 1

## 2014-05-27 MED ORDER — ONDANSETRON HCL 4 MG/2ML IJ SOLN
INTRAMUSCULAR | Status: DC | PRN
Start: 2014-05-27 — End: 2014-05-27
  Administered 2014-05-27: 4 mg via INTRAVENOUS

## 2014-05-27 MED ORDER — LACTATED RINGERS IV SOLN
INTRAVENOUS | Status: DC
  Administered 2014-05-27: 10:00:00 via INTRAVENOUS
  Filled 2014-05-27: qty 1000

## 2014-05-27 MED ORDER — GLYCINE 1.5 % IR SOLN
Status: DC | PRN
  Administered 2014-05-27: 3000 mL

## 2014-05-27 MED ORDER — LIDOCAINE HCL (CARDIAC) 20 MG/ML IV SOLN
INTRAVENOUS | Status: DC | PRN
  Administered 2014-05-27: 60 mg via INTRAVENOUS

## 2014-05-27 MED ORDER — PHENAZOPYRIDINE HCL 200 MG PO TABS
200.0000 mg | ORAL_TABLET | Freq: Once | ORAL | Status: AC
  Administered 2014-05-27: 200 mg via ORAL
  Filled 2014-05-27: qty 1

## 2014-05-27 MED ORDER — PROPOFOL 10 MG/ML IV BOLUS
INTRAVENOUS | Status: DC | PRN
  Administered 2014-05-27: 200 mg via INTRAVENOUS

## 2014-05-27 MED ORDER — CIPROFLOXACIN IN D5W 200 MG/100ML IV SOLN
INTRAVENOUS | Status: AC
  Filled 2014-05-27: qty 100

## 2014-05-27 MED ORDER — TAMSULOSIN HCL 0.4 MG PO CAPS
0.4000 mg | ORAL_CAPSULE | Freq: Once | ORAL | Status: AC
  Administered 2014-05-27: 0.4 mg via ORAL
  Filled 2014-05-27: qty 1

## 2014-05-27 MED ORDER — CIPROFLOXACIN IN D5W 200 MG/100ML IV SOLN
200.0000 mg | INTRAVENOUS | Status: AC
  Administered 2014-05-27: 200 mg via INTRAVENOUS
  Filled 2014-05-27: qty 100

## 2014-05-27 MED ORDER — OXYCODONE HCL 5 MG PO TABS
ORAL_TABLET | ORAL | Status: AC
  Filled 2014-05-27: qty 2

## 2014-05-27 MED ORDER — DEXAMETHASONE SODIUM PHOSPHATE 4 MG/ML IJ SOLN
INTRAMUSCULAR | Status: DC | PRN
  Administered 2014-05-27: 10 mg via INTRAVENOUS

## 2014-05-27 MED ORDER — FENTANYL CITRATE 0.05 MG/ML IJ SOLN
INTRAMUSCULAR | Status: AC
  Filled 2014-05-27: qty 4

## 2014-05-27 MED ORDER — ACETAMINOPHEN 10 MG/ML IV SOLN
INTRAVENOUS | Status: DC | PRN
  Administered 2014-05-27: 1000 mg via INTRAVENOUS

## 2014-05-27 MED ORDER — PHENAZOPYRIDINE HCL 200 MG PO TABS: 200.0000 mg | ORAL_TABLET | Freq: Three times a day (TID) | ORAL | Status: DC | PRN

## 2014-05-27 MED ORDER — HYDROMORPHONE HCL 1 MG/ML IJ SOLN
0.2500 mg | INTRAMUSCULAR | Status: DC | PRN
  Filled 2014-05-27: qty 1

## 2014-05-27 MED ORDER — LIDOCAINE HCL 2 % EX GEL
CUTANEOUS | Status: DC | PRN
  Administered 2014-05-27: 1 via URETHRAL

## 2014-05-27 SURGICAL SUPPLY — 36 items
BAG DRAIN URO-CYSTO SKYTR STRL (DRAIN) ×2 IMPLANT
BAG DRN ANRFLXCHMBR STRAP LEK (BAG)
BAG DRN UROCATH (DRAIN) ×1
BAG URINE DRAINAGE (UROLOGICAL SUPPLIES) IMPLANT
BAG URINE LEG 19OZ MD ST LTX (BAG) IMPLANT
CANISTER SUCT LVC 12 LTR MEDI- (MISCELLANEOUS) ×1 IMPLANT
CATH FOLEY 3WAY 30CC 24FR (CATHETERS)
CATH HEMA 3WAY 30CC 24FR COUDE (CATHETERS) IMPLANT
CATH HEMA 3WAY 30CC 24FR RND (CATHETERS) IMPLANT
CATH URTH STD 24FR FL 3W 2 (CATHETERS) ×1 IMPLANT
CLOTH BEACON ORANGE TIMEOUT ST (SAFETY) ×2 IMPLANT
DRAPE CAMERA CLOSED 9X96 (DRAPES) ×2 IMPLANT
ELECT BUTTON BIOP 24F 90D PLAS (MISCELLANEOUS) IMPLANT
ELECT BUTTON HF 24-28F 2 30DE (ELECTRODE) IMPLANT
ELECT LOOP MED HF 24F 12D CBL (CLIP) ×1 IMPLANT
ELECT REM PT RETURN 9FT ADLT (ELECTROSURGICAL) ×2
ELECT RESECT VAPORIZE 12D CBL (ELECTRODE) ×1 IMPLANT
ELECTRODE REM PT RTRN 9FT ADLT (ELECTROSURGICAL) ×1 IMPLANT
EVACUATOR MICROVAS BLADDER (UROLOGICAL SUPPLIES) IMPLANT
GLOVE BIO SURGEON STRL SZ 6.5 (GLOVE) ×1 IMPLANT
GLOVE BIO SURGEON STRL SZ8 (GLOVE) ×2 IMPLANT
GLOVE BIOGEL PI IND STRL 6.5 (GLOVE) IMPLANT
GLOVE BIOGEL PI INDICATOR 6.5 (GLOVE) ×2
GOWN PREVENTION PLUS LG XLONG (DISPOSABLE) ×1 IMPLANT
GOWN STRL REIN XL XLG (GOWN DISPOSABLE) ×1 IMPLANT
GOWN STRL REUS W/TWL LRG LVL3 (GOWN DISPOSABLE) ×1 IMPLANT
GOWN STRL REUS W/TWL XL LVL3 (GOWN DISPOSABLE) ×1 IMPLANT
HOLDER FOLEY CATH W/STRAP (MISCELLANEOUS) IMPLANT
IV NS IRRIG 3000ML ARTHROMATIC (IV SOLUTION) ×1 IMPLANT
PACK CYSTO (CUSTOM PROCEDURE TRAY) ×2 IMPLANT
PLUG CATH AND CAP STER (CATHETERS) IMPLANT
SET ASPIRATION TUBING (TUBING) ×1 IMPLANT
SYR 30ML LL (SYRINGE) IMPLANT
SYRINGE IRR TOOMEY STRL 70CC (SYRINGE) IMPLANT
WATER STERILE IRR 3000ML UROMA (IV SOLUTION) ×1 IMPLANT
WATER STERILE IRR 500ML POUR (IV SOLUTION) ×1 IMPLANT

## 2014-05-27 NOTE — Op Note (Signed)
PATIENT:  Todd [name]  PRE-OPERATIVE DIAGNOSIS: 1. Recurrent bladder tumors 2. Prostate pain  POST-OPERATIVE DIAGNOSIS: Same  PROCEDURE: 1. Cystoscopy with TURBT 2. Prostate biopsy  SURGEON:  Claybon Jabs  INDICATION: Todd [name] is a 59 year old male with a history of transitional cell carcinoma of the bladder that was resected in 7/15. He received postoperative mitomycin-C. His pathology revealed urothelial carcinoma that was superficial and low-grade. He has been undergoing surveillance cystoscopy and he was found to have 3 areas of papillary recurrence. He's been having a lot of prostate pain. He has a lot of pain after having an ejaculation and also had a lot of discomfort as I passed the cystoscope through his prostatic urethra. I placed him on antibiotics and an anti-inflammatory medication and he reported today that he has noted some improvement in the discomfort. We discussed performing a biopsy of his prostatic urethra because of the pain.  ANESTHESIA:  General  EBL:  Minimal  DRAINS: None  LOCAL MEDICATIONS USED:  2% lidocaine jelly per urethra  SPECIMEN:  1. Resectional biopsy of bladder tumors from the left wall 2. Resectional biopsy of bladder tumor from the posterior dome 3. Bladder biopsy from the posterior wall 4. Biopsy of prostatic urethra  Description of procedure: After informed consent the patient was taken to the operating room and placed on the table in a supine position. General anesthesia was then administered. Once fully anesthetized the patient was moved to the dorsal lithotomy position and the genitalia were sterilely prepped and draped in standard fashion. An official timeout was then performed.  The 4 French cystoscope was then passed under direct vision down the urethra which is noted be normal. The prostatic urethra revealed bilobar hypertrophy but had no lesions or abnormal findings. The bladder was then entered and fully inspected. There were 2  small papillary tumors on the left wall superiorly that were adjacent to each other and photographed. A third tumor was noted on the posterior dome and no other papillary lesions were noted however I did note that the mucosa seemed a little bit erythematous on the posterior wall. I used the cold cup biopsy forceps to actually completely remove all 3 of the tumors and then also used the cold cup biopsy forceps to obtain a biopsy from the erythematous area of the posterior bladder wall. I then obtained a cold cup biopsy of the right and left lobes of the prostate in the middle of the prostatic urethra. All of these biopsy sites were then fulgurated with the Bugbee electrode. The bladder was then drained, the cystoscope removed and I instilled 2% lidocaine jelly in the urethra and applied a penile clamp. The patient was then awakened and taken to the recovery room in stable and satisfactory condition. He tolerated the procedure well no intraoperative complications.  PLAN OF CARE: Discharge to home after PACU  PATIENT DISPOSITION:  PACU - hemodynamically stable.

## 2014-05-27 NOTE — Anesthesia Procedure Notes (Signed)
Procedure Name: LMA Insertion Date/Time: 05/27/2014 10:21 AM Performed by: Mechele Claude Pre-anesthesia Checklist: Patient identified, Emergency Drugs available, Suction available and Patient being monitored Patient Re-evaluated:Patient Re-evaluated prior to inductionOxygen Delivery Method: Circle System Utilized Preoxygenation: Pre-oxygenation with 100% oxygen Intubation Type: IV induction Ventilation: Mask ventilation without difficulty LMA: LMA inserted LMA Size: 5.0 Number of attempts: 1 Airway Equipment and Method: bite block Placement Confirmation: positive ETCO2 Tube secured with: Tape Dental Injury: Teeth and Oropharynx as per pre-operative assessment

## 2014-05-27 NOTE — Transfer of Care (Signed)
Immediate Anesthesia Transfer of Care Note  Patient: Todd [name]  Procedure(s) Performed: Procedure(s) (LRB): TRANSURETHRAL RESECTION OF BLADDER TUMOR WITH GYRUS (TURBT-GYRUS) AND TRANSURETHERAL BIOPSY OF PROSTATE (N/A)  Patient Location: PACU  Anesthesia Type: General  Level of Consciousness: awake, alert  and oriented  Airway & Oxygen Therapy: Patient Spontanous Breathing and Patient connected to face mask oxygen  Post-op Assessment: Report given to PACU RN and Post -op Vital signs reviewed and stable  Post vital signs: Reviewed and stable  Complications: No apparent anesthesia complications Last Vitals:  Filed Vitals:   05/27/14 1100  BP: 124/75  Pulse: 75  Temp: 36.7 C  Resp: 10

## 2014-05-27 NOTE — Discharge Instructions (Signed)

## 2014-05-27 NOTE — Anesthesia Preprocedure Evaluation (Signed)
Anesthesia Evaluation  Patient identified by MRN, date of birth, ID band Patient awake    Reviewed: Allergy & Precautions, NPO status , Patient's Chart, lab work & pertinent test results  History of Anesthesia Complications Negative for: history of anesthetic complications  Airway        Dental   Pulmonary shortness of breath, COPDCurrent Smoker,          Cardiovascular negative cardio ROS      Neuro/Psych negative neurological ROS     GI/Hepatic GERD-  ,S/p colon ca   Endo/Other  negative endocrine ROS  Renal/GU Bladder ca     Musculoskeletal   Abdominal   Peds  Hematology negative hematology ROS (+)   Anesthesia Other Findings   Reproductive/Obstetrics                             Anesthesia Physical Anesthesia Plan  ASA: II  Anesthesia Plan: General   Post-op Pain Management:    Induction: Intravenous  Airway Management Planned: LMA  Additional Equipment:   Intra-op Plan:   Post-operative Plan: Extubation in OR  Informed Consent: I have reviewed the patients History and Physical, chart, labs and discussed the procedure including the risks, benefits and alternatives for the proposed anesthesia with the patient or authorized representative who has indicated his/her understanding and acceptance.   Dental advisory given  Plan Discussed with: CRNA and Surgeon  Anesthesia Plan Comments:         Anesthesia Quick Evaluation

## 2014-05-27 NOTE — H&P (Signed)
Todd [name] is a 59 year old male with a history of bladder cancer.   History of Present Illness      Transitional cell carcinoma of the bladder: A CT A/P was obtained which revealed a 10 mm left bladder wall mass. No evidence of extravesical extension or adenopathy/metastatic disease.   Risk factors: He has been a smoker in the past smoking as much as one half a pack per day. He has decreased his smoking to 2-3 cigarettes per day but continues to smoke at this time.  TURBT 10/26/13: He underwent resection of his bladder tumor with postoperative mitomycin C.  Pathology:Papillary TCCa (Ta,G1)    LUTS - He noted an increase in his nocturia 3-4 times but did not note significant daytime frequency. He also has experienced some suprapubic pressure-type sensation in the area of the bladder. In addition he has noted some occasional intermittency and hesitancy.    Interval history: He reports he's been having pain in the prostate primarily after ejaculation. No hematuria.   Past Medical History Problems  1. History of chronic obstructive lung disease (Z87.09) 2. History of depression (Z86.59) 3. History of esophageal reflux (Z87.19) 4. History of gout (Z87.39) 5. History of malignant neoplasm of colon (Z85.038) 6. History of Malignant neoplasm of lateral wall of bladder (C67.2)  Surgical History Problems  1. History of Back Surgery 2. History of Colon Surgery 3. History of Cystoscopy With Fulguration Small Lesion (5-2mm) 4. History of Gallbladder Surgery  Current Meds 1. BuPROPion HCl ER (XL) 150 MG Oral Tablet Extended Release 24 Hour;  Therapy: (Recorded:22Jul2015) to Recorded 2. Detrol LA 4 MG Oral Capsule Extended Release 24 Hour;  Therapy: (Recorded:22Jul2015) to Recorded 3. Lexapro 20 MG Oral Tablet;  Therapy: (Recorded:22Jul2015) to Recorded 4. Methadone HCl - 10 MG Oral Tablet;  Therapy: (Recorded:22Jul2015) to Recorded 5. Methadone HCl TBDP;  Therapy:  (Recorded:27Jul2015) to Recorded 6. Nasonex 50 MCG/ACT Nasal Suspension;  Therapy: (Recorded:22Jul2015) to Recorded 7. NexIUM 40 MG Oral Capsule Delayed Release;  Therapy: (Recorded:22Jul2015) to Recorded 8. Oxybutynin Chloride 5 MG Oral Tablet;  Therapy: (Recorded:22Jul2015) to Recorded 9. Percocet 5-325 MG Oral Tablet;  Therapy: (Recorded:22Jul2015) to Recorded 10. Phenazopyridine HCl - 200 MG Oral Tablet;   Therapy: (Recorded:22Jul2015) to Recorded 11. Roxicet 5-325 MG Oral Tablet;   Therapy: (Recorded:22Jul2015) to Recorded 12. Senna-Docusate Sodium TABS;   Therapy: (Recorded:22Jul2015) to Recorded  Allergies Medication  1. Clindamycin HCl CAPS 2. Vicodin TABS  Family History Problems  1. Family history of colon cancer (Z80.0) : Sibling 2. Family history of congestive heart failure (Z82.49) : Sister 3. Family history of malignant neoplasm (Z80.9) : Mother 4. Family history of stroke (Z82.3) : Father  Social History Problems  1. Alcohol use (Z78.9) 2. Caffeine use (F15.90) 3. Divorced 4. Never a smoker 5. Occupation  Review of Systems Genitourinary and gastrointestinal system(s) were reviewed and pertinent findings if present are noted.  Genitourinary: nocturia, difficulty starting the urinary stream, urinary stream starts and stops, post-void dribbling and erectile dysfunction.    Vitals Vital Signs  Height: 5 ft 11 in Weight: 200 lb  BMI Calculated: 27.89 BSA Calculated: 2.11 Blood Pressure: 143 / 88 Temperature: 97.7 F Heart Rate: 81  Review of Systems Genitourinary, constitutional, skin, eye, otolaryngeal, hematologic/lymphatic, cardiovascular, pulmonary, endocrine, musculoskeletal, gastrointestinal, neurological and psychiatric system(s) were reviewed and pertinent findings if present are noted.  Genitourinary: nocturia, incontinence, difficulty starting the urinary stream, urinary stream starts and stops, hematuria, erectile dysfunction and penile pain.   Gastrointestinal: nausea and heartburn.  Constitutional: night sweats, feeling tired (fatigue) and recent weight loss.  Integumentary: skin rash/lesion and pruritus.  Respiratory: shortness of breath and cough.  Endocrine: polydipsia.  Musculoskeletal: back pain and joint pain.  Psychiatric: anxiety and depression.    Physical Exam Constitutional: Well nourished and well developed . No acute distress.  ENT:. The ears and nose are normal in appearance.  Neck: The appearance of the neck is normal and no neck mass is present.  Pulmonary: No respiratory distress and normal respiratory rhythm and effort.  Cardiovascular: Heart rate and rhythm are normal . No peripheral edema.  Abdomen: The abdomen is soft and nontender. No masses are palpated. No CVA tenderness. No hernias are palpable. No hepatosplenomegaly noted.  Genitourinary: Examination of the penis demonstrates no discharge, no masses, no lesions and a normal meatus. The penis is uncircumcised. The scrotum is without lesions. The right epididymis is palpably normal and non-tender. The left epididymis is palpably normal and non-tender. The right testis is non-tender and without masses. The left testis is non-tender and without masses.  Lymphatics: The femoral and inguinal nodes are not enlarged or tender.  Skin: Normal skin turgor, no visible rash and no visible skin lesions.  Neuro/Psych:. Mood and affect are appropriate.    Procedure  Procedure: Cystoscopy was performed on 05/16/14  Indication: History of Urothelial Carcinoma.  Informed Consent: Risks, benefits, and potential adverse events were discussed and informed consent was obtained from the patient.  Prep: The patient was prepped with betadine.  Anesthesia:. Local anesthesia was administered intraurethrally with 2% lidocaine jelly.  Procedure Note:  Urethral meatus:. No abnormalities.  Anterior urethra: No abnormalities.  Prostatic urethra: No abnormalities . Initially I  saw no abnormality of the prostatic urethra but the patient experienced severe discomfort as the scope was passed through the prostatic urethra.  Bladder: Visulization was clear. The ureteral orifices were in the normal anatomic position bilaterally and had clear efflux of urine. Multiple tumors were identified in the bladder. A papillary tumor was seen in the bladder. The patient tolerated the procedure well.  Complications: None.    Assessment  He was found have several papillary recurrences. There were 2 small ones near the dome. One on the right wall and what appeared to be some erythematous changes that were suspicious lateral to the right ureteral orifice. We therefore discussed outpatient resection of these lesions. They appear to be superficial and low-grade like his original tumor and therefore mitomycin C postoperatively is not indicated.    He also seems to have very tender prostate. We will risk PSA result was normal but I was to treat him with both an antibiotic and an anti-inflammatory medication to try to help control this discomfort. He asked for prescription of Dilaudid. I told him I would not prescribe that. I'm going to use a nonsteroidal anti-inflammatory medication and we also discussed obtaining a transurethral resectional biopsy of his prostate under anesthesia which may also be helpful in determining the cause of his discomfort.    His PSA was noted to be totally normal at 0.51.   Plan   1.  He was started on Septra DS and meloxicam.  2. He will be scheduled for transurethral resection/biopsy of his bladder tumors.  3. I will also perform a transurethral resectional biopsy of his prostate.

## 2014-05-27 NOTE — Anesthesia Postprocedure Evaluation (Signed)
  Anesthesia Post-op Note  Patient: Todd [name]  Procedure(s) Performed: Procedure(s): TRANSURETHRAL RESECTION OF BLADDER TUMOR WITH GYRUS (TURBT-GYRUS) AND TRANSURETHERAL BIOPSY OF PROSTATE (N/A)  Patient Location: PACU  Anesthesia Type:General  Level of Consciousness: awake and alert   Airway and Oxygen Therapy: Patient Spontanous Breathing  Post-op Pain: mild  Post-op Assessment: Post-op Vital signs reviewed  Post-op Vital Signs: stable  Last Vitals:  Filed Vitals:   05/27/14 1130  BP: 121/77  Pulse: 72  Temp:   Resp: 7    Complications: No apparent anesthesia complications

## 2014-05-28 ENCOUNTER — Encounter (HOSPITAL_BASED_OUTPATIENT_CLINIC_OR_DEPARTMENT_OTHER): Payer: Self-pay | Admitting: Urology

## 2014-07-03 ENCOUNTER — Ambulatory Visit: Payer: Commercial Managed Care - HMO | Admitting: Gastroenterology

## 2014-07-26 ENCOUNTER — Emergency Department (HOSPITAL_COMMUNITY)
Admission: EM | Admit: 2014-07-26 | Discharge: 2014-07-26 | Disposition: A | Discharge status: Home / Self Care | Payer: Commercial Managed Care - HMO | Attending: Emergency Medicine | Admitting: Emergency Medicine

## 2014-07-26 DIAGNOSIS — K219 Gastro-esophageal reflux disease without esophagitis: Secondary | ICD-10-CM | POA: Diagnosis not present

## 2014-07-26 DIAGNOSIS — F329 Major depressive disorder, single episode, unspecified: Secondary | ICD-10-CM | POA: Diagnosis not present

## 2014-07-26 DIAGNOSIS — Z720 Tobacco use: Secondary | ICD-10-CM | POA: Insufficient documentation

## 2014-07-26 DIAGNOSIS — M5431 Sciatica, right side: Secondary | ICD-10-CM | POA: Insufficient documentation

## 2014-07-26 DIAGNOSIS — Z79899 Other long term (current) drug therapy: Secondary | ICD-10-CM | POA: Diagnosis not present

## 2014-07-26 DIAGNOSIS — M1990 Unspecified osteoarthritis, unspecified site: Secondary | ICD-10-CM | POA: Insufficient documentation

## 2014-07-26 DIAGNOSIS — G8929 Other chronic pain: Secondary | ICD-10-CM | POA: Diagnosis not present

## 2014-07-26 DIAGNOSIS — Z85038 Personal history of other malignant neoplasm of large intestine: Secondary | ICD-10-CM | POA: Insufficient documentation

## 2014-07-26 DIAGNOSIS — Z760 Encounter for issue of repeat prescription: Secondary | ICD-10-CM | POA: Diagnosis not present

## 2014-07-26 DIAGNOSIS — J449 Chronic obstructive pulmonary disease, unspecified: Secondary | ICD-10-CM | POA: Diagnosis not present

## 2014-07-26 DIAGNOSIS — M545 Low back pain: Secondary | ICD-10-CM | POA: Diagnosis present

## 2014-07-26 DIAGNOSIS — Z7951 Long term (current) use of inhaled steroids: Secondary | ICD-10-CM | POA: Diagnosis not present

## 2014-07-26 DIAGNOSIS — Z8551 Personal history of malignant neoplasm of bladder: Secondary | ICD-10-CM | POA: Insufficient documentation

## 2014-07-26 MED ORDER — ORPHENADRINE CITRATE ER 100 MG PO TB12: 100.0000 mg | ORAL_TABLET | Freq: Two times a day (BID) | ORAL | Status: DC

## 2014-07-26 MED ORDER — HYDROMORPHONE HCL 1 MG/ML IJ SOLN
2.0000 mg | Freq: Once | INTRAMUSCULAR | Status: AC
  Administered 2014-07-26: 2 mg via INTRAMUSCULAR
  Filled 2014-07-26: qty 2

## 2014-07-26 NOTE — ED Notes (Signed)
Pt states that he is having lower back pain. Pt states that he feels like he is experiencing withdrawal symptoms, he usually takes methadone 2 times a day but ran out of medication on Tuesday. He stated that his doctor told him to come to the ED if his pain became unbearable.

## 2014-07-26 NOTE — ED Notes (Signed)
Pt states that he does have some numbness and tingling in his right leg that is not new or unusual.

## 2014-07-26 NOTE — ED Notes (Signed)
Dr. Gentry at bedside. 

## 2014-07-26 NOTE — ED Notes (Signed)
Pt. Left with all belongings and refused wheelchair 

## 2014-07-26 NOTE — ED Provider Notes (Signed)
CSN: [id number]     Arrival date & time 07/26/14  0516 History   First MD Initiated Contact with Patient 07/26/14 [phone]     Chief Complaint  Patient presents with  . Back Pain   Treatment) Patient is a 59 y.o. male presenting with back pain.  Back Pain Location:  Lumbar spine Quality:  Aching and shooting Radiates to: R leg. Pain severity:  Severe Pain is:  Same all the time Onset quality:  Gradual Duration:  3 days Timing:  Constant Progression:  Worsening Chronicity:  Chronic Context comment:  Ran out of home pain medication Relieved by:  Nothing Worsened by:  Movement, twisting, touching and stress   Past Medical History  Diagnosis Date  . COPD (chronic obstructive pulmonary disease)   . GERD (gastroesophageal reflux disease)   . Depression   . MLH1 gene mutation   . Arthritis   . Chronic low back pain   . Bladder tumor   . Prostate pain   . History of colon cancer oncologist-- dr Benay Spice--  dx  with Hereditary non-polyposis colon cancer syndrome,  MLH1 gene mutation--      dx june 2013--  stage II, T3--  s/p  transverse colectomy--  completed 2 cycles  chemotherapy--  NO RECURRENCE in clinical remission  . History of acute pancreatitis     due to SIRS  . 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 Lactose intolerance   . Short of breath on exertion   . Dysuria   . Wears glasses    Past Surgical History  Procedure Laterality Date  . Cholecystectomy  2013  . Colonoscopy w/ polypectomy  Nov. 2014  . Tonsillectomy  age 38  . Transurethral resection of bladder tumor with gyrus (turbt-gyrus) N/A 10/26/2013    Procedure: TRANSURETHRAL RESECTION OF BLADDER TUMOR WITH GYRUS (TURBT-GYRUS) ;  Surgeon: Claybon Jabs, MD;  Location: [facility];  Service: Urology;  Laterality: N/A;  . Colonoscopy N/A 12/04/2013    Procedure: COLONOSCOPY;  Surgeon: Inda Castle, MD;  Location: [facility];  Service: Endoscopy;   Laterality: N/A;  . Ercp N/A 01/01/2014    Procedure: ENDOSCOPIC RETROGRADE CHOLANGIOPANCREATOGRAPHY (ERCP);  Surgeon: Inda Castle, MD;  Location: [facility];  Service: Endoscopy;  Laterality: N/A;  with spyglass - preprocedure ATB's  . Spyglass cholangioscopy N/A 01/01/2014    Procedure: CHENIDPO CHOLANGIOSCOPY;  Surgeon: Inda Castle, MD;  Location: [facility];  Service: Endoscopy;  Laterality: N/A;  . Colon surgery  06/ 2013    Transverse  . Lumbar disc surgery  x4   last one 2001    fusion L5 -- S1  . Transurethral resection of bladder tumor with gyrus (turbt-gyrus) N/A 05/27/2014    Procedure: TRANSURETHRAL RESECTION OF BLADDER TUMOR  AND TRANSURETHERAL BIOPSY OF PROSTATE;  Surgeon: Claybon Jabs, MD;  Location: [facility];  Service: Urology;  Laterality: N/A;   Family History  Problem Relation Age of Onset  . Colon cancer Brother     x2  . Colon cancer Sister   . Colon cancer Maternal Uncle   . Colon cancer Cousin     x 2  . Breast cancer Maternal Grandmother   . Colon polyps Daughter     x 3  . Diabetes Father   . Stroke Father    History  Substance Use Topics  . Smoking status: Light Tobacco Smoker -- 20 years  Types: Cigarettes, E-cigarettes  . Smokeless tobacco: Never Used     Comment: hx 1PPD smoker quit jan 2015 for 20 yrs  but currently will "smoke cig's or ecig'sonce in awhile"  . Alcohol Use: No    Review of Systems  Musculoskeletal: Positive for back pain.  All other systems reviewed and are negative.     Allergies  Clindamycin/lincomycin; Milk-related compounds; and Hydrocodone  Home Medications   Prior to Admission medications   Medication Sig Start Date End Date Taking? Authorizing Provider  albuterol (PROVENTIL HFA;VENTOLIN HFA) 108 (90 BASE) MCG/ACT inhaler Inhale 1 puff into the lungs every 6 (six) hours as needed for wheezing or shortness of breath.    Historical Provider, MD  budesonide-formoterol (SYMBICORT) 160-4.5  MCG/ACT inhaler Inhale 2 puffs into the lungs 2 (two) times daily.    Historical Provider, MD  escitalopram (LEXAPRO) 20 MG tablet Take 20 mg by mouth every morning.     Historical Provider, MD  esomeprazole (NEXIUM) 40 MG capsule Take 40 mg by mouth 2 (two) times daily before a meal.    Historical Provider, MD  lidocaine (LIDODERM) 5 % Place 1 patch onto the skin daily. Remove & Discard patch within 12 hours or as directed by MD    Historical Provider, MD  lidocaine (LMX) 4 % cream Apply 1 application topically daily as needed. Low back    Historical Provider, MD  methadone (DOLOPHINE) 10 MG tablet Take 10 mg by mouth every 12 (twelve) hours.    Historical Provider, MD  mometasone (NASONEX) 50 MCG/ACT nasal spray Place 2 sprays into the nose daily as needed (congestion).     Historical Provider, MD  orphenadrine (NORFLEX) 100 MG tablet Take 1 tablet (100 mg total) by mouth 2 (two) times daily. 07/26/14   Debby Freiberg, MD  Oxycodone HCl 10 MG TABS Take 1 tablet (10 mg total) by mouth every 4 (four) hours as needed. 05/27/14   Kathie Rhodes, MD  oxyCODONE-acetaminophen (PERCOCET) 10-325 MG per tablet Take 1 tablet by mouth every 6 (six) hours as needed for pain. 05/17/14   Ladell Pier, MD  phenazopyridine (PYRIDIUM) 200 MG tablet Take 1 tablet (200 mg total) by mouth 3 (three) times daily as needed for pain. 05/27/14   Kathie Rhodes, MD   BP 149/92 mmHg  Pulse 92  Temp(Src) 97.6 F (36.4 C)  Resp 20  SpO2 100% Physical Exam  Constitutional: He is oriented to person, place, and time. He appears well-developed and well-nourished.  HENT:  Head: Normocephalic and atraumatic.  Eyes: Conjunctivae and EOM are normal.  Neck: Normal range of motion. Neck supple.  Cardiovascular: Normal rate, regular rhythm and normal heart sounds.   Pulmonary/Chest: Effort normal and breath sounds normal. No respiratory distress.  Abdominal: He exhibits no distension. There is no tenderness. There is no rebound and no  guarding.  Musculoskeletal: Normal range of motion.       Cervical back: Normal.       Thoracic back: Normal.       Lumbar back: He exhibits tenderness and bony tenderness.  Straight leg positive on R  Neurological: He is alert and oriented to person, place, and time.  subj decrease in sensation throughout R leg, 1+ DTR bil le, 5/5 strength bil le  Skin: Skin is warm and dry.  Vitals reviewed.   ED Course  Procedures (including critical care time) Labs Review Labs Reviewed - No data to display  Imaging Review No results found.   EKG  Interpretation None      MDM   Final diagnoses:  Sciatica, right    59 y.o. male with pertinent PMH of chronic back pain, bladder and colon ca presents with acute on chronic back pain after running out of pain medication 3 days ago. Patient states that if he had pain medicine at home he would not need to come into the emergency department.  No new neuro complaints, however he has a baseline ho urinary retention and R leg numbness.  He specifically denies change in these symptoms over course of worsening.  I discussed departmental narcotic policy with pt, he was given pain medication here, and dc with norflex and standard return precautions.  He will fu with PCP.   I have reviewed all laboratory and imaging studies if ordered as above  1. Sciatica, right         Debby Freiberg, MD 07/26/14 [phone]

## 2014-07-26 NOTE — Discharge Instructions (Signed)

## 2014-09-20 ENCOUNTER — Emergency Department (HOSPITAL_COMMUNITY): Payer: Medicare HMO

## 2014-09-20 ENCOUNTER — Emergency Department (HOSPITAL_COMMUNITY)
Admission: EM | Admit: 2014-09-20 | Discharge: 2014-09-20 | Disposition: A | Discharge status: Home / Self Care | Payer: Medicare HMO | Attending: Emergency Medicine | Admitting: Emergency Medicine

## 2014-09-20 ENCOUNTER — Encounter (HOSPITAL_COMMUNITY): Payer: Self-pay | Admitting: Emergency Medicine

## 2014-09-20 DIAGNOSIS — Z79899 Other long term (current) drug therapy: Secondary | ICD-10-CM | POA: Insufficient documentation

## 2014-09-20 DIAGNOSIS — R319 Hematuria, unspecified: Secondary | ICD-10-CM | POA: Insufficient documentation

## 2014-09-20 DIAGNOSIS — R1084 Generalized abdominal pain: Secondary | ICD-10-CM | POA: Diagnosis not present

## 2014-09-20 DIAGNOSIS — R300 Dysuria: Secondary | ICD-10-CM | POA: Insufficient documentation

## 2014-09-20 DIAGNOSIS — Z9049 Acquired absence of other specified parts of digestive tract: Secondary | ICD-10-CM | POA: Diagnosis not present

## 2014-09-20 DIAGNOSIS — Z8551 Personal history of malignant neoplasm of bladder: Secondary | ICD-10-CM | POA: Insufficient documentation

## 2014-09-20 DIAGNOSIS — Z8739 Personal history of other diseases of the musculoskeletal system and connective tissue: Secondary | ICD-10-CM | POA: Diagnosis not present

## 2014-09-20 DIAGNOSIS — R591 Generalized enlarged lymph nodes: Secondary | ICD-10-CM | POA: Insufficient documentation

## 2014-09-20 DIAGNOSIS — Z720 Tobacco use: Secondary | ICD-10-CM | POA: Diagnosis not present

## 2014-09-20 DIAGNOSIS — R590 Localized enlarged lymph nodes: Secondary | ICD-10-CM

## 2014-09-20 DIAGNOSIS — Z85038 Personal history of other malignant neoplasm of large intestine: Secondary | ICD-10-CM | POA: Diagnosis not present

## 2014-09-20 DIAGNOSIS — G8929 Other chronic pain: Secondary | ICD-10-CM | POA: Insufficient documentation

## 2014-09-20 DIAGNOSIS — R110 Nausea: Secondary | ICD-10-CM | POA: Insufficient documentation

## 2014-09-20 DIAGNOSIS — F329 Major depressive disorder, single episode, unspecified: Secondary | ICD-10-CM | POA: Diagnosis not present

## 2014-09-20 DIAGNOSIS — R109 Unspecified abdominal pain: Secondary | ICD-10-CM

## 2014-09-20 DIAGNOSIS — R197 Diarrhea, unspecified: Secondary | ICD-10-CM | POA: Diagnosis not present

## 2014-09-20 DIAGNOSIS — Z8719 Personal history of other diseases of the digestive system: Secondary | ICD-10-CM | POA: Diagnosis not present

## 2014-09-20 LAB — CBC WITH DIFFERENTIAL/PLATELET
Basophils Absolute: 0 10*3/uL (ref 0.0–0.1)
Basophils Relative: 0 % (ref 0–1)
Eosinophils Absolute: 0.1 10*3/uL (ref 0.0–0.7)
Eosinophils Relative: 2 % (ref 0–5)
HCT: 54.3 % — ABNORMAL HIGH (ref 39.0–52.0)
Hemoglobin: 18.4 g/dL — ABNORMAL HIGH (ref 13.0–17.0)
Lymphocytes Relative: 47 % — ABNORMAL HIGH (ref 12–46)
Lymphs Abs: 2.6 10*3/uL (ref 0.7–4.0)
MCH: 34.5 pg — ABNORMAL HIGH (ref 26.0–34.0)
MCHC: 33.9 g/dL (ref 30.0–36.0)
MCV: 101.7 fL — ABNORMAL HIGH (ref 78.0–100.0)
Monocytes Absolute: 0.5 10*3/uL (ref 0.1–1.0)
Monocytes Relative: 9 % (ref 3–12)
Neutro Abs: 2.3 10*3/uL (ref 1.7–7.7)
Neutrophils Relative %: 42 % — ABNORMAL LOW (ref 43–77)
Platelets: 223 10*3/uL (ref 150–400)
RBC: 5.34 MIL/uL (ref 4.22–5.81)
RDW: 12.6 % (ref 11.5–15.5)
WBC: 5.4 10*3/uL (ref 4.0–10.5)

## 2014-09-20 LAB — COMPREHENSIVE METABOLIC PANEL
ALT: 20 U/L (ref 17–63)
AST: 20 U/L (ref 15–41)
Albumin: 4 g/dL (ref 3.5–5.0)
Alkaline Phosphatase: 59 U/L (ref 38–126)
Anion gap: 7 (ref 5–15)
BUN: 14 mg/dL (ref 6–20)
CO2: 22 mmol/L (ref 22–32)
Calcium: 9.4 mg/dL (ref 8.9–10.3)
Chloride: 109 mmol/L (ref 101–111)
Creatinine, Ser: 1.51 mg/dL — ABNORMAL HIGH (ref 0.61–1.24)
GFR calc Af Amer: 57 mL/min — ABNORMAL LOW (ref >60)
GFR calc non Af Amer: 49 mL/min — ABNORMAL LOW (ref >60)
Glucose, Bld: 129 mg/dL — ABNORMAL HIGH (ref 65–99)
Potassium: 4 mmol/L (ref 3.5–5.1)
Sodium: 138 mmol/L (ref 135–145)
Total Bilirubin: 0.7 mg/dL (ref 0.3–1.2)
Total Protein: 7.1 g/dL (ref 6.5–8.1)

## 2014-09-20 LAB — LIPASE, BLOOD: Lipase: 25 U/L (ref 22–51)

## 2014-09-20 MED ORDER — PANTOPRAZOLE SODIUM 40 MG PO TBEC
40.0000 mg | DELAYED_RELEASE_TABLET | Freq: Once | ORAL | Status: AC
  Administered 2014-09-20: 40 mg via ORAL
  Filled 2014-09-20: qty 1

## 2014-09-20 MED ORDER — IOHEXOL 300 MG/ML  SOLN
50.0000 mL | Freq: Once | INTRAMUSCULAR | Status: AC | PRN
  Administered 2014-09-20: 50 mL via ORAL

## 2014-09-20 MED ORDER — FENTANYL CITRATE (PF) 100 MCG/2ML IJ SOLN
100.0000 ug | Freq: Once | INTRAMUSCULAR | Status: AC
  Administered 2014-09-20: 100 ug via INTRAVENOUS
  Filled 2014-09-20: qty 2

## 2014-09-20 MED ORDER — ONDANSETRON HCL 4 MG/2ML IJ SOLN
4.0000 mg | Freq: Once | INTRAMUSCULAR | Status: AC
  Administered 2014-09-20: 4 mg via INTRAVENOUS
  Filled 2014-09-20: qty 2

## 2014-09-20 MED ORDER — ONDANSETRON HCL 4 MG PO TABS: 4.0000 mg | ORAL_TABLET | Freq: Four times a day (QID) | ORAL | Status: DC

## 2014-09-20 MED ORDER — FENTANYL CITRATE (PF) 100 MCG/2ML IJ SOLN
50.0000 ug | Freq: Once | INTRAMUSCULAR | Status: AC
  Administered 2014-09-20: 50 ug via INTRAVENOUS
  Filled 2014-09-20: qty 2

## 2014-09-20 MED ORDER — SODIUM CHLORIDE 0.9 % IV BOLUS (SEPSIS)
1000.0000 mL | Freq: Once | INTRAVENOUS | Status: AC
  Administered 2014-09-20: 1000 mL via INTRAVENOUS

## 2014-09-20 MED ORDER — OXYCODONE HCL 10 MG PO TABS: 10.0000 mg | ORAL_TABLET | ORAL | Status: DC

## 2014-09-20 MED ORDER — IOHEXOL 300 MG/ML  SOLN
100.0000 mL | Freq: Once | INTRAMUSCULAR | Status: AC | PRN
  Administered 2014-09-20: 100 mL via INTRAVENOUS

## 2014-09-20 NOTE — Discharge Instructions (Signed)
Colorectal Cancer Colorectal cancer is an abnormal growth of tissue (tumor) in the colon or rectum that is cancerous (malignant). Unlike noncancerous (benign) tumors, malignant tumors can spread to other parts of your body. The colon is the large bowel or large intestine. The rectum is the last several inches of the colon.  RISK FACTORS The exact cause of colorectal cancer is unknown. However, the following factors may increase your chances of getting colorectal cancer:   Age older than 50 years.   Abnormal growths (polyps) on the inner wall of the colon or rectum.   Diabetes.   African American race.   Family history of hereditary nonpolyposis colorectal cancer. This condition is caused by changes in the genes that are responsible for repairing mismatched DNA.   Personal history of cancer. A person who has already had colorectal cancer may develop it a second time. Also, women with a history of ovarian, uterine, or breast cancer are at a somewhat higher risk of developing colorectal cancer.  Certain hereditary conditions.  Eating a diet that is high in fat (especially animal fat) and low in fiber, fruits, and vegetables.  Sedentary lifestyle.  Inflammatory bowel disease, including ulcerative colitis and Crohn's disease.   Smoking.   Excessive alcohol use.  SYMPTOMS Early colorectal cancer often does not cause symptoms. As the cancer grows, symptoms may include:   Changes in bowel habits.  Diarrhea.   Constipation.   Feeling like the bowel does not empty completely after a bowel movement.   Blood in the stool.   Stools that are narrower than usual.   Abdominal discomfort, pain, bloating, fullness, or cramps.  Frequent gas pain.   Unexplained weight loss.   Constant tiredness.   Nausea and vomiting.  DIAGNOSIS  Your health care provider will ask about your medical history. He or she may also perform a number of procedures, such as:   A physical  exam.  A digital rectal exam.  A fecal occult blood test.  A barium enema.  Blood tests.   X-rays.   Imaging tests, such as CT scans or MRIs.   Taking a tissue sample (biopsy) from your colon or rectum to look for cancer cells.   A sigmoidoscopy to view the inside of the last part of your colon.   A colonoscopy to view the inside of your entire colon.   An endorectal ultrasound to see how deep a rectal tumor has grown and whether the cancer has spread to lymph nodes or other nearby tissues.  Your cancer will be staged to determine its severity and extent. Staging is a careful attempt to find out the size of the tumor, whether the cancer has spread, and if so, to what parts of the body. You may need to have more tests to determine the stage of your cancer. The test results will help determine what treatment plan is best for you.   Stage 0. The cancer is found only in the innermost lining of the colon or rectum.   Stage I. The cancer has grown into the inner wall of the colon or rectum. The cancer has not yet reached the outer wall of the colon.   Stage II. The cancer extends more deeply into or through the wall of the colon or rectum. It may have invaded nearby tissue, but cancer cells have not spread to the lymph nodes.   Stage III. The cancer has spread to nearby lymph nodes but not to other parts of the body.  Stage IV. The cancer has spread to other parts of the body, such as the liver or lungs.  Your health care provider may tell you the detailed stage of your cancer, which includes both a number and a letter.  TREATMENT  Depending on the type and stage, colorectal cancer may be treated with surgery, radiation therapy, chemotherapy, targeted therapy, or radiofrequency ablation. Some people have a combination of these therapies. Surgery may be done to remove the polyps from your colon. In early stages, your health care provider may be able to do this during a  colonoscopy. In later stages, surgery may be done to remove part of your colon.  HOME CARE INSTRUCTIONS   Take medicines only as directed by your health care provider.   Maintain a healthy diet.   Consider joining a support group. This may help you learn to cope with the stress of having colorectal cancer.   Seek advice to help you manage treatment of side effects.   Keep all follow-up visits as directed by your health care provider.   Inform your cancer specialist if you are admitted to the hospital.  SEEK MEDICAL CARE IF:  Your diarrhea or constipation does not go away.   Your bowel habits change.  You have increased abdominal pain.   You notice new fatigue or weakness.  You lose weight. Document Released: 03/29/2005 Document Revised: 08/13/2013 Document Reviewed: 09/21/2012 [facility] Patient Information 2015 [place], [place]. This information is not intended to replace advice given to you by your health care provider. Make sure you discuss any questions you have with your health care provider.   Abdominal Pain Many things can cause abdominal pain. Usually, abdominal pain is not caused by a disease and will improve without treatment. It can often be observed and treated at home. Your health care provider will do a physical exam and possibly order blood tests and X-rays to help determine the seriousness of your pain. However, in many cases, more time must pass before a clear cause of the pain can be found. Before that point, your health care provider may not know if you need more testing or further treatment. HOME CARE INSTRUCTIONS  Monitor your abdominal pain for any changes. The following actions may help to alleviate any discomfort you are experiencing:  Only take over-the-counter or prescription medicines as directed by your health care provider.  Do not take laxatives unless directed to do so by your health care provider.  Try a clear liquid diet (broth, tea, or  water) as directed by your health care provider. Slowly move to a bland diet as tolerated. SEEK MEDICAL CARE IF:  You have unexplained abdominal pain.  You have abdominal pain associated with nausea or diarrhea.  You have pain when you urinate or have a bowel movement.  You experience abdominal pain that wakes you in the night.  You have abdominal pain that is worsened or improved by eating food.  You have abdominal pain that is worsened with eating fatty foods.  You have a fever. SEEK IMMEDIATE MEDICAL CARE IF:   Your pain does not go away within 2 hours.  You keep throwing up (vomiting).  Your pain is felt only in portions of the abdomen, such as the right side or the left lower portion of the abdomen.  You pass bloody or black tarry stools. MAKE SURE YOU:  Understand these instructions.   Will watch your condition.   Will get help right away if you are not doing well  or get worse.  Document Released: 01/06/2005 Document Revised: 04/03/2013 Document Reviewed: 12/06/2012 [facility] Patient Information 2015 [place], [place]. This information is not intended to replace advice given to you by your health care provider. Make sure you discuss any questions you have with your health care provider.

## 2014-09-20 NOTE — ED Notes (Addendum)
Pt reports abdominal pain/N/V/D for "quite a while now." Next cancer center appointment is in August. Says he is currently being treated with "something they actually pour into my bladder. I'm just tired, I'm tired of hurting. I have to go every week to get the procedure done (bladder) and I don't have the money to keep doing that. I think my colon cancer is back. This is hereditary -my brother died last year from it. I felt this exact same way when I had colon cancer." No other c/c. RR even/unlabored.

## 2014-09-20 NOTE — ED Notes (Signed)
Unable to urinate at this time. Given a urinal.

## 2014-09-20 NOTE — ED Provider Notes (Signed)
CSN: [id number]     Arrival date & time 09/20/14  1315 History   First MD Initiated Contact with Patient 09/20/14 1342     Chief Complaint  Patient presents with  . Abdominal Pain  . Cancer pt      (Consider location/radiation/quality/duration/timing/severity/associated sxs/prior Treatment) Patient is a 59 y.o. male presenting with abdominal pain. The history is provided by the patient. No language interpreter was used.  Abdominal Pain Pain location:  Generalized Pain quality: aching and bloating   Pain radiates to:  Does not radiate Pain severity:  Moderate Onset quality:  Gradual Timing:  Constant Progression:  Waxing and waning Chronicity:  Recurrent Context: previous surgery   Relieved by:  Nothing (narcotics) Worsened by:  Nothing tried Associated symptoms: diarrhea, dysuria, fatigue, hematuria and nausea   Associated symptoms: no fever, no flatus and no vomiting   Risk factors: multiple surgeries   Risk factors comment:  Colon and bladder cancer   Past Medical History  Diagnosis Date  . COPD (chronic obstructive pulmonary disease)   . GERD (gastroesophageal reflux disease)   . Depression   . MLH1 gene mutation   . Arthritis   . Chronic low back pain   . Bladder tumor   . Prostate pain   . History of colon cancer oncologist-- dr Benay Spice--  dx  with Hereditary non-polyposis colon cancer syndrome,  MLH1 gene mutation--      dx june 2013--  stage II, T3--  s/p  transverse colectomy--  completed 2 cycles  chemotherapy--  NO RECURRENCE in clinical remission  . History of acute pancreatitis     due to SIRS  . 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 Lactose intolerance   . Short of breath on exertion   . Dysuria   . Wears glasses    Past Surgical History  Procedure Laterality Date  . Cholecystectomy  2013  . Colonoscopy w/ polypectomy  Nov. 2014  . Tonsillectomy  age 69  . Transurethral  resection of bladder tumor with gyrus (turbt-gyrus) N/A 10/26/2013    Procedure: TRANSURETHRAL RESECTION OF BLADDER TUMOR WITH GYRUS (TURBT-GYRUS) ;  Surgeon: Claybon Jabs, MD;  Location: [facility];  Service: Urology;  Laterality: N/A;  . Colonoscopy N/A 12/04/2013    Procedure: COLONOSCOPY;  Surgeon: Inda Castle, MD;  Location: [facility];  Service: Endoscopy;  Laterality: N/A;  . Ercp N/A 01/01/2014    Procedure: ENDOSCOPIC RETROGRADE CHOLANGIOPANCREATOGRAPHY (ERCP);  Surgeon: Inda Castle, MD;  Location: [facility];  Service: Endoscopy;  Laterality: N/A;  with spyglass - preprocedure ATB's  . Spyglass cholangioscopy N/A 01/01/2014    Procedure: VOPFYTWK CHOLANGIOSCOPY;  Surgeon: Inda Castle, MD;  Location: [facility];  Service: Endoscopy;  Laterality: N/A;  . Colon surgery  06/ 2013    Transverse  . Lumbar disc surgery  x4   last one 2001    fusion L5 -- S1  . Transurethral resection of bladder tumor with gyrus (turbt-gyrus) N/A 05/27/2014    Procedure: TRANSURETHRAL RESECTION OF BLADDER TUMOR  AND TRANSURETHERAL BIOPSY OF PROSTATE;  Surgeon: Claybon Jabs, MD;  Location: [facility];  Service: Urology;  Laterality: N/A;   Family History  Problem Relation Age of Onset  . Colon cancer Brother     x2  . Colon cancer Sister   . Colon cancer Maternal Uncle   . Colon cancer Cousin  x 2  . Breast cancer Maternal Grandmother   . Colon polyps Daughter     x 3  . Diabetes Father   . Stroke Father    History  Substance Use Topics  . Smoking status: Light Tobacco Smoker -- 20 years    Types: Cigarettes, E-cigarettes  . Smokeless tobacco: Never Used     Comment: hx 1PPD smoker quit jan 2015 for 20 yrs  but currently will "smoke cig's or ecig'sonce in awhile"  . Alcohol Use: No    Review of Systems  Constitutional: Positive for fatigue. Negative for fever.  Gastrointestinal: Positive for nausea, abdominal pain and diarrhea. Negative for vomiting and flatus.   Genitourinary: Positive for dysuria and hematuria.      Allergies  Clindamycin/lincomycin; Milk-related compounds; and Hydrocodone  Home Medications   Prior to Admission medications   Medication Sig Start Date End Date Taking? Authorizing Provider  albuterol (PROVENTIL HFA;VENTOLIN HFA) 108 (90 BASE) MCG/ACT inhaler Inhale 1 puff into the lungs every 6 (six) hours as needed for wheezing or shortness of breath.   Yes Historical Provider, MD  budesonide-formoterol (SYMBICORT) 160-4.5 MCG/ACT inhaler Inhale 2 puffs into the lungs 2 (two) times daily.   Yes Historical Provider, MD  lidocaine (LIDODERM) 5 % Place 1 patch onto the skin daily. Remove & Discard patch within 12 hours or as directed by MD   Yes Historical Provider, MD  escitalopram (LEXAPRO) 20 MG tablet Take 20 mg by mouth every morning.     Historical Provider, MD  ondansetron (ZOFRAN) 4 MG tablet Take 1 tablet (4 mg total) by mouth every 6 (six) hours. 09/20/14   Ernestina Patches, MD  orphenadrine (NORFLEX) 100 MG tablet Take 1 tablet (100 mg total) by mouth 2 (two) times daily. 07/26/14   Debby Freiberg, MD  Oxycodone HCl 10 MG TABS Take 1 tablet (10 mg total) by mouth every 4 (four) hours. PRN moderate to severe pain 09/20/14   Ernestina Patches, MD  oxyCODONE-acetaminophen (PERCOCET) 10-325 MG per tablet Take 1 tablet by mouth every 6 (six) hours as needed for pain. Patient not taking: Reported on 09/20/2014 05/17/14   Ladell Pier, MD  phenazopyridine (PYRIDIUM) 200 MG tablet Take 1 tablet (200 mg total) by mouth 3 (three) times daily as needed for pain. Patient not taking: Reported on 09/20/2014 05/27/14   Kathie Rhodes, MD   BP 101/84 mmHg  Pulse 76  Temp(Src) 98.1 F (36.7 C)  Resp 17  SpO2 99% Physical Exam  Constitutional: He is oriented to person, place, and time. He appears well-developed and well-nourished. No distress.  HENT:  Head: Normocephalic and atraumatic.  Mouth/Throat: No oropharyngeal exudate.  Eyes: Pupils  are equal, round, and reactive to light.  Neck: Normal range of motion. Neck supple.  Cardiovascular: Normal rate, regular rhythm and normal heart sounds.  Exam reveals no gallop and no friction rub.   No murmur heard. Pulmonary/Chest: Effort normal and breath sounds normal. No respiratory distress. He has no wheezes. He has no rales.  Abdominal: Soft. Bowel sounds are normal. He exhibits no distension and no mass. There is tenderness in the right upper quadrant, periumbilical area and left upper quadrant. There is no rebound and no guarding.    Musculoskeletal: Normal range of motion. He exhibits no edema or tenderness.  Neurological: He is alert and oriented to person, place, and time.  Skin: Skin is warm and dry.  Psychiatric: He has a normal mood and affect.    ED Course  Procedures (including critical care time) Labs Review Labs Reviewed  CBC WITH DIFFERENTIAL/PLATELET - Abnormal; Notable for the following:    Hemoglobin 18.4 (*)    HCT 54.3 (*)    MCV 101.7 (*)    MCH 34.5 (*)    Neutrophils Relative % 42 (*)    Lymphocytes Relative 47 (*)    All other components within normal limits  COMPREHENSIVE METABOLIC PANEL - Abnormal; Notable for the following:    Glucose, Bld 129 (*)    Creatinine, Ser 1.51 (*)    GFR calc non Af Amer 49 (*)    GFR calc Af Amer 57 (*)    All other components within normal limits  LIPASE, BLOOD    Imaging Review Ct Abdomen Pelvis W Contrast  09/20/2014   CLINICAL DATA:  Abdominal pain with nausea vomiting and diarrhea. Colon cancer in 2013 with chemotherapy. Bladder cancer with TURBT in July of 2015. Transverse colectomy.  EXAM: CT ABDOMEN AND PELVIS WITH CONTRAST  TECHNIQUE: Multidetector CT imaging of the abdomen and pelvis was performed using the standard protocol following bolus administration of intravenous contrast.  CONTRAST:  169m OMNIPAQUE IOHEXOL 300 MG/ML  SOLN  COMPARISON:  09/24/2013 abdominal pelvic CT.  MRCP of 12/08/2013  FINDINGS:  Lower chest: Right base scarring. Heart size upper normal, without pericardial or pleural effusion.  Hepatobiliary: Too small to characterize left hepatic lobe lesions are unchanged and likely cysts. No suspicious liver lesion.  Cholecystectomy. Persistent common duct dilatation, maximally 1.7 cm on coronal image 37. Similar to slightly decreased since the prior MRI. No obstructive stone or mass.  Pancreas: Normal, without mass or ductal dilatation.  Spleen: Normal  Adrenals/Urinary Tract: Normal adrenal glands. Normal kidneys, without hydronephrosis. Normal urinary bladder.  Stomach/Bowel: Normal stomach, without wall thickening. Surgical changes about the distal transverse colon. Normal terminal ileum and appendix. Appendix is not visualized but there is no evidence of right lower quadrant inflammation. Normal small bowel.  Vascular/Lymphatic: Aortic and branch vessel atherosclerosis. No retroperitoneal or retrocrural adenopathy. No pelvic adenopathy. Suspect an 8 mm node in the transverse mesocolon, including on image 29. New since the prior. There is also adjacent nodal tissue at 9 mm on image 27.  Reproductive: Normal prostate.  Other: No significant free fluid. No evidence of omental or peritoneal disease.  Musculoskeletal: Lumbosacral spine fixation.  IMPRESSION: 1. No acute process or explanation for abdominal pain, nausea, vomiting, or diarrhea. 2. Surgical changes in the transverse colon. Suspect new upper normal to minimally enlarged nodes in the transverse mesocolon. 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 Potential clinical strategies include followup with CT at 3 months or further evaluation with non emergent outpatient PET. 3. Cholecystectomy with similar to slight decrease in moderate common duct dilatation. No cause identified. this could represent a type 1 choledochal cyst. 4. Resection of previously described left bladder lesion,  without evidence of bladder cancer recurrence or metastasis.   Electronically Signed   By: KAbigail MiyamotoM.D.   On: 09/20/2014 15:34     EKG Interpretation None      MDM   Final diagnoses:  Abdominal pain, unspecified abdominal location  Diarrhea  Lymphadenopathy, abdominal    Pt is a 59y.o. male with Pmhx as above with Lynch Syndrome who presents with several weeks of worsening abdominal pain & d/a. No fever or chills.  He has had nausea, but no vomiting.  He states this is how he felt  when his colon cancer was diagnosed.on physical exam, vital signs are stable.  Patient is in no acute distress.  He is generalized abdominal tenderness which is worse in the upper quadrants.  He has a soft reducible incisional hernia     CT abdomen pelvis shows new upper normal to minimally elevated nodes in the transverse mesial: Which radiology notes is in the drainage pattern for a transverse colon metastasis.  This concerning given patient's medical history.  No other acute findings seen.  There is been no recurrence or metastasis related to his prior bladder lesion.  Findings discussed with patient.  I've encouraged him to follow up closely with Dr. Ammie Dalton, for further workup including outpatient PET scan or repeat CT scan in 3 months.  Oxycodone.  Zofran given for symptomatic control.  Case management has also seen patient, regarding payment issues.   Ralene Bathe evaluation in the Emergency Department is complete. It has been determined that no acute conditions requiring further emergency intervention are present at this time. The patient/guardian have been advised of the diagnosis and plan. We have discussed signs and symptoms that warrant return to the ED, such as changes or worsening in symptoms, worsening pain, fever, inability to tolerate liquids      Ernestina Patches, MD 09/21/14 [id number]

## 2014-09-20 NOTE — Progress Notes (Signed)
CM consult for financial co pay concerns for specialist office Cm and EDP, [place], reviewed pt concerns Assessed and spoke with pt He reports coming from [place] with [employer] coverage and not having [employer] Medicare in [place] with a higher specialist co pay of $45 States he has to be seen by a specialist once a week and pay $45 co pay and he is having trouble affording it CM discussed family, friends, and local churches and financial assistance programs including [employer], goodwill, etc  Encouraged pt to have his daughter (who "works here at [employer] long" per pt) to check for a foundation supporting his dx Pt goes to [place] discussed him making an inquiry of [place] about a possible foundation  Pt states he applied for Medicaid ([place]) but was "denied" Encouraged applying again if financial changes Discussed Development worker, community at [facility] unable to complete medicaid applications but can assist with initiation Referred to DSS

## 2014-10-01 ENCOUNTER — Telehealth: Payer: Self-pay | Admitting: Oncology

## 2014-10-01 ENCOUNTER — Telehealth: Payer: Self-pay | Admitting: *Deleted

## 2014-10-01 NOTE — Telephone Encounter (Signed)
s.w. pt and advised on sooner appt.Marland KitchenMarland KitchenMarland KitchenMarland Kitchenpt ok and aware

## 2014-10-01 NOTE — Telephone Encounter (Signed)
Pt called asking if he could be seen today; went to the ER 09/20/14 for abd pain and stated he needed to f/u with Dr. Benay Spice right away; also requesting pain medication. Was told in the ED that according to his CT scan, he needed to be seen right away and that his cancer has come back.  Per Dr. Benay Spice, he has already reviewed the scan and can keep his Aug appt, and there was nothing on the scan that was emergent at this time. Also, pt should call PCP for pain medication since there is no indication that pain is coming from cancer.  Relayed information to pt, pt upset he could not come in today and stated he did not want to go back to PCP ("It felt like I was just being seen for money").  Pt requesting to be seen sooner than Aug. POF sent to scheduling as urgent. Pt voiced understanding that someone will contact him with appt.

## 2014-10-07 ENCOUNTER — Ambulatory Visit: Payer: Commercial Managed Care - HMO | Admitting: Oncology

## 2014-10-07 ENCOUNTER — Other Ambulatory Visit: Payer: Commercial Managed Care - HMO

## 2014-10-08 ENCOUNTER — Telehealth: Payer: Self-pay | Admitting: Nurse Practitioner

## 2014-10-08 ENCOUNTER — Telehealth: Payer: Self-pay | Admitting: Oncology

## 2014-10-08 ENCOUNTER — Ambulatory Visit (HOSPITAL_BASED_OUTPATIENT_CLINIC_OR_DEPARTMENT_OTHER): Payer: Medicare HMO | Admitting: Nurse Practitioner

## 2014-10-08 ENCOUNTER — Telehealth: Payer: Self-pay | Admitting: *Deleted

## 2014-10-08 VITALS — BP 133/87 | HR 88 | Temp 98.4°F | Resp 18 | Ht 70.0 in | Wt 192.3 lb

## 2014-10-08 DIAGNOSIS — Z85038 Personal history of other malignant neoplasm of large intestine: Secondary | ICD-10-CM | POA: Diagnosis not present

## 2014-10-08 DIAGNOSIS — R109 Unspecified abdominal pain: Secondary | ICD-10-CM

## 2014-10-08 MED ORDER — OXYCODONE HCL 5 MG PO TABS: 5.0000 mg | ORAL_TABLET | Freq: Four times a day (QID) | ORAL | Status: DC | PRN

## 2014-10-08 NOTE — Telephone Encounter (Signed)
Gave and printed appt sched and avs for July.Marland KitchenMarland KitchenMarland KitchenPt is an established pt with Dr. Deatra Ina...the patient will call and sched appt

## 2014-10-08 NOTE — Progress Notes (Addendum)
[place] OFFICE PROGRESS NOTE   Diagnosis:  Colon cancer, hereditary non-polyposis colon cancer syndrome  INTERVAL HISTORY:   Mr. Ivins returns prior to scheduled follow-up. He reports upper abdominal pain over the past several months. The pain has progressively worsened. He has been taking 3-4 Aleve a day for the past 4-5 weeks. He is also taking Tylenol. He reports the pain is similar to the pain he experienced when he was diagnosed with colon cancer. He has nausea/vomiting several times a week. Appetite is unchanged. He notes that his stools are "loose and runny". Stools are lighter in color than typical. No fever. He denies alcohol use.  He requests oxycodone for the pain. He reports taking oxycodone in the past with good relief.  Objective:  Vital signs in last 24 hours:  Blood pressure 133/87, pulse 88, temperature 98.4 F (36.9 C), temperature source Oral, resp. rate 18, height '5\' 10"'  (1.778 m), weight 192 lb 4.8 oz (87.227 kg), SpO2 99 %.    HEENT: No thrush or ulcers. Lymphatics: No palpable cervical, supra clavicular, axillary or inguinal lymph nodes. Resp: Lungs clear bilaterally. Cardio: Regular rate and rhythm. GI: Abdomen is soft. No hepatomegaly. Tender left upper abdomen. No mass. No apparent ascites. Vascular: No leg edema.  Lab Results:  Lab Results  Component Value Date   WBC 5.4 09/20/2014   HGB 18.4* 09/20/2014   HCT 54.3* 09/20/2014   MCV 101.7* 09/20/2014   PLT 223 09/20/2014   NEUTROABS 2.3 09/20/2014   09/20/2014 lipase 25 Imaging:  No results found.  Medications: I have reviewed the patient's current medications.  Assessment/Plan: 1. History of stage II colon cancer diagnosed in June 2013 (T3 N0), status post 2 cycles of adjuvant chemotherapy   2. Hereditary non-polyposis colon cancer syndrome, MLH1 deleterious mutation  3. Multiple family members with colon cancer including 2 brothers and his sister. One brother died of  colon cancer at age 81. He reports 3 of his 6 children have been diagnosed with hereditary non-polyposis colon cancer syndrome  4. Chronic pain syndrome-back pain  5. Noninvasive low-grade papillary urothelial carcinoma, TURBT by Dr.Ottelin July 2015. He received postoperative mitomycin-C.  Repeat cystoscopy 05/16/2014  Cystoscopy with TURBT, prostate biopsy 05/27/2014. Tumor left wall showed low-grade papillary urothelial carcinoma with no evidence of invasion. Tumor posterior dome showed inflammation and urothelial atypia. Posterior wall bladder biopsy showed inflammation and urothelial atypia. Prostatic urethra biopsy showed benign prostatic urethra.  6. COPD  7. Small right posterior cervical node on exam 05/17/2014  8. CT scan 09/20/2014 showed surgical changes in the transverse colon. New upper normal to minimally enlarged lymph nodes in the transverse mesocolon suspected. Cholecystectomy was similar to slight decrease in moderate common duct dilatation. Resection of previously described bladder lesion.   Disposition: Mr. Candela has a history of stage II colon cancer and hereditary non-polyposis colon cancer syndrome. He remains in clinical remission from colon cancer.  The etiology of the abdominal pain is unclear. Dr. Benay Spice feels it is unlikely the pain is related to cancer. There was no explanation for the pain on the recent CT scan. The significance of the small "normal to minimally enlarged" nodes in the transverse mesocolon is unclear. We will obtain a follow-up CT scan at a 3 to four-month interval. Dr. Benay Spice recommends a referral to Dr. Deatra Ina for further evaluation of the abdominal pain. We recommended he discontinue use of Aleve. He was given a prescription for oxycodone 5 mg every 6 hours as needed  with 30 tablets to be dispensed. He understands we will not give additional pain medication refills as there is no evidence the pain is cancer related.  He will keep his  scheduled follow-up visit with Dr. Benay Spice on 11/01/2014.  Patient seen with Dr. Benay Spice. 25 minutes were spent face-to-face at today's visit with the majority of that time involved in counseling/coordination of care.  Ned Card ANP/GNP-BC   10/08/2014  11:55 AM  This was a shared visit with Ned Card. Mr. Chisenhall was interviewed and examined.  I think it is unlikely his abdominal pain is related to cancer. We will refer him to Dr. Deatra Ina to get his opinion. We gave him a prescription for oxycodone per his request, but will not prescribe additional narcotics.  He will return as scheduled next month. We will plan for a follow-up CT to evaluate the mesenteric lymph nodes.  Julieanne Manson, M.D.

## 2014-10-08 NOTE — Telephone Encounter (Signed)
Per 06/28 POF pt scheduled to see NP/LT, pt is aware... KJ

## 2014-10-08 NOTE — Telephone Encounter (Signed)
Called and informed patient of appointment time with Ned Card, NP.

## 2014-11-01 ENCOUNTER — Telehealth: Payer: Self-pay | Admitting: Oncology

## 2014-11-01 ENCOUNTER — Other Ambulatory Visit: Payer: Commercial Managed Care - HMO

## 2014-11-01 ENCOUNTER — Other Ambulatory Visit: Payer: Self-pay | Admitting: *Deleted

## 2014-11-01 ENCOUNTER — Encounter: Payer: Self-pay | Admitting: Gastroenterology

## 2014-11-01 ENCOUNTER — Ambulatory Visit: Payer: Commercial Managed Care - HMO | Admitting: Oncology

## 2014-11-01 NOTE — Telephone Encounter (Signed)
Pt forgot apt today r/s per 07/22 POF per MD for next week.Marland KitchenMarland Kitchen

## 2014-11-01 NOTE — Telephone Encounter (Signed)
S/w pt confirming labs/ov per 07/22 POF, also mailed copy of schedule per pt's request... KJ

## 2014-11-04 ENCOUNTER — Telehealth: Payer: Self-pay

## 2014-11-04 NOTE — Telephone Encounter (Signed)
Pt had an 830am pv appt and no showed. Called pt and he rescheduled pv to 130pm today. Pt no showed this pv. Attempted to call pt at 2:00pm, 230pm by Lenard Galloway. Also attempted to call pt at 0500pm and no answer and voice mail full, so could not leave message. Canceled pv and scheduled colon. Letter mailed.  Tonika Eden pv

## 2014-11-06 ENCOUNTER — Other Ambulatory Visit: Payer: Medicare HMO

## 2014-11-06 ENCOUNTER — Encounter: Payer: Self-pay | Admitting: *Deleted

## 2014-11-06 ENCOUNTER — Ambulatory Visit (INDEPENDENT_AMBULATORY_CARE_PROVIDER_SITE_OTHER): Payer: Medicare HMO | Admitting: Gastroenterology

## 2014-11-06 ENCOUNTER — Telehealth: Payer: Self-pay | Admitting: Oncology

## 2014-11-06 ENCOUNTER — Telehealth: Payer: Self-pay | Admitting: Gastroenterology

## 2014-11-06 ENCOUNTER — Encounter: Payer: Self-pay | Admitting: Gastroenterology

## 2014-11-06 ENCOUNTER — Ambulatory Visit (HOSPITAL_BASED_OUTPATIENT_CLINIC_OR_DEPARTMENT_OTHER): Payer: Medicare HMO | Admitting: Oncology

## 2014-11-06 VITALS — BP 142/82 | HR 82 | Temp 98.7°F | Resp 18 | Ht 70.0 in | Wt 196.3 lb

## 2014-11-06 VITALS — BP 122/80 | HR 70

## 2014-11-06 DIAGNOSIS — Z85038 Personal history of other malignant neoplasm of large intestine: Secondary | ICD-10-CM

## 2014-11-06 DIAGNOSIS — J449 Chronic obstructive pulmonary disease, unspecified: Secondary | ICD-10-CM | POA: Diagnosis not present

## 2014-11-06 DIAGNOSIS — G894 Chronic pain syndrome: Secondary | ICD-10-CM

## 2014-11-06 DIAGNOSIS — Z8559 Personal history of malignant neoplasm of other urinary tract organ: Secondary | ICD-10-CM | POA: Diagnosis not present

## 2014-11-06 DIAGNOSIS — R1033 Periumbilical pain: Secondary | ICD-10-CM | POA: Diagnosis not present

## 2014-11-06 DIAGNOSIS — R109 Unspecified abdominal pain: Secondary | ICD-10-CM | POA: Diagnosis not present

## 2014-11-06 MED ORDER — HYOSCYAMINE SULFATE ER 0.375 MG PO TBCR: EXTENDED_RELEASE_TABLET | ORAL | Status: DC

## 2014-11-06 MED ORDER — OXYCODONE-ACETAMINOPHEN 10-325 MG PO TABS: 1.0000 | ORAL_TABLET | Freq: Four times a day (QID) | ORAL | Status: DC | PRN

## 2014-11-06 MED ORDER — NA SULFATE-K SULFATE-MG SULF 17.5-3.13-1.6 GM/177ML PO SOLN: 1.0000 | Freq: Once | ORAL | Status: DC

## 2014-11-06 NOTE — Assessment & Plan Note (Signed)
Patient has persistent periumbilical pain.  All the labs are normal CT scan demonstrates dilation of the common bile duct which raises the question of an obstructing lesion.  If present this could explain his abdominal pain.  At the same time, patient is at increased risk for gastric malignancies which should be ruled out.  Recommendations #1 upper endoscopy #2 MRCP

## 2014-11-06 NOTE — Progress Notes (Signed)
  [place] OFFICE PROGRESS NOTE   Diagnosis: Colon cancer  INTERVAL HISTORY:   Todd [name] returns as scheduled. He has not seen Todd [name] for evaluation of the abdominal pain. He complains of persistent diffuse abdominal pain. He has rectal urgency with increased pain prior to bowel movements.  He reports he does not have pain medication at present. He requests narcotic analgesia. He states he has not seen Todd [name].  Objective:  Vital signs in last 24 hours:  Blood pressure 142/82, pulse 82, temperature 98.7 F (37.1 C), temperature source Oral, resp. rate 18, height $RemoveBe'5\' 10"'YkOljVTGh$  (1.778 m), weight 196 lb 4.8 oz (89.041 kg), SpO2 100 %.    HEENT: Neck without mass Lymphatics: No cervical, supra-clavicular, or inguinal nodes Resp: Lungs clear bilaterally Cardio: Regular rate and rhythm GI: No hepatosplenomegaly, no mass, mild diffuse tenderness Vascular: No leg edema     Portacath/PICC-without erythema  Lab Results:   Lab Results  Component Value Date   CEA 1.1 05/17/2014    Medications: I have reviewed the patient's current medications.  1.History of stage II colon cancer diagnosed in June 2013 (T3 N0), status post 2 cycles of adjuvant chemotherapy   2. Hereditary non-polyposis colon cancer syndrome, MLH1 deleterious mutation  3. Multiple family members with colon cancer including 2 brothers and his sister. One brother died of colon cancer at age 44. He reports 3 of his 6 children have been diagnosed with hereditary non-polyposis colon cancer syndrome  4. Chronic pain syndrome-back pain  5. Noninvasive low-grade papillary urothelial carcinoma, TURBT by Todd [name] July 2015. He received postoperative mitomycin-C.  Repeat cystoscopy 05/16/2014  Cystoscopy with TURBT, prostate biopsy 05/27/2014. Tumor left wall showed low-grade papillary urothelial carcinoma with no evidence of invasion. Tumor posterior dome showed inflammation and urothelial atypia.  Posterior wall bladder biopsy showed inflammation and urothelial atypia. Prostatic urethra biopsy showed benign prostatic urethra.  6. COPD  7. Small right posterior cervical node on exam 05/17/2014  8. CT scan 09/20/2014 showed surgical changes in the transverse colon. New upper normal to minimally enlarged lymph nodes in the transverse mesocolon suspected. Cholecystectomy was similar to slight decrease in moderate common duct dilatation. Resection of previously described bladder lesion. Assessment/Plan:  Todd [name] remains in clinical remission from colon cancer. I think it is unlikely his pain is related to cancer. I recommended he see Todd [name] for evaluation of the abdominal pain and to schedule cancer surveillance in the setting of HNP[facility].  We contacted the outpatient pharmacy and they confirmed he had been prescribed methadone and Percocet on 10/25/2014 by reviewing the [facility]. We contacted the [facility] and they confirmed the prescriptions were filled. We also called Todd [name] office and they confirmed he was seen for an appointment and prescribed methadone/Percocet on 10/25/2014.  We explained to Todd [name] that we will not prescribe him narcotics.  He will return for an office visit and restaging CT as planned in 2 months.    Betsy Coder, MD  11/06/2014  11:48 AM

## 2014-11-06 NOTE — Patient Instructions (Signed)
You have been scheduled for a colonoscopy. Please follow written instructions given to you at your visit today.  Please pick up your prep supplies at the pharmacy within the next 1-3 days. If you use inhalers (even only as needed), please bring them with you on the day of your procedure. Your physician has requested that you go to www.startemmi.com and enter the access code given to you at your visit today. This web site gives a general overview about your procedure. However, you should still follow specific instructions given to you by our office regarding your preparation for the procedure.  We are giving you a printed prescription today

## 2014-11-06 NOTE — Progress Notes (Signed)
      History of Present Illness:  [name] has returned for evaluation of abdominal pain.  For several months he has been complaining of left upper quadrant pain radiating to the lower quadrants.  It is best described as gas-like pain characterized by sharp pain that is partially relieved when he passes gas.  Pain will often recur.  He has occasional nausea with abdominal distention.  He moves his bowels regularly and does note some urgency.  There is no history of rectal bleeding.  Colonoscopy one year ago was unrevealing except for diverticula.  CT scan in June, 2016 demonstrated minimally enlarged nodes in the mesocolon.  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  Upper endoscopy a year ago for apparent periumbilical pain was negative.    Review of Systems: Pertinent positive and negative review of systems were noted in the above HPI section. All other review of systems were otherwise negative.    Current Medications, Allergies, Past Medical History, Past Surgical History, Family History and Social History were reviewed in [facility] record  Vital signs were reviewed in today's medical record. Physical Exam: General: Well developed , well nourished, no acute distress Skin: anicteric Head: Normocephalic and atraumatic Eyes:  sclerae anicteric, EOMI Ears: Normal auditory acuity Mouth: No deformity or lesions Lymph Nodes: no lymphadenopathy Lungs: Clear throughout to auscultation Heart: Regular rate and rhythm; no murmurs, rubs or brui: Gastroinestinal:  Soft,  and non distended. No masses, hepatosplenomegaly or hernias noted. Normal Bowel sounds.  There is moderate diffuse tenderness without guarding or rebound Rectal:deferred Musculoskeletal: Symmetrical with no gross deformities  Pulses:  Normal pulses noted Extremities: No clubbing, cyanosis, edema or deformities noted Neurological: Alert oriented  x 4, grossly nonfocal Psychological:  Alert and cooperative. Normal mood and affect  See Assessment and Plan under Problem List

## 2014-11-06 NOTE — Assessment & Plan Note (Signed)
Abdominal pain is nonspecific and may be due to hypersensitivity to smooth muscle contractions.  There is no evidence for recurrent disease.  The patient has had issues in the past with chronic pain.  I am doubtful that we will identify any specific source.  Recommendations #1 trial of hyomax or 0.375 mg twice a day.  Failing this, I would consider a trial of Elavil or perhaps Neurontin

## 2014-11-06 NOTE — Assessment & Plan Note (Signed)
Patient is due for follow-up colonoscopy

## 2014-11-06 NOTE — Progress Notes (Signed)
[place] Work  Clinical Social Work was referred by nurse for assessment of psychosocial needs due to depression and financial concerns.  Clinical Social Worker met with patient at [facility] prior to his visit with MD to offer support and assess for needs.  Pt reports several stressors in his life currently. His largest includes his significant other suffered extensive stroke and now resides in a facility in [place]. He reports he has not worked in years due to his cancer and receives too much from [employer] to qualify for medicaid that could cover his [employer]. Pt reports he attempted to seek other resources through DSS, he could receive $16 a month in food stamps.   CSW reviewed other resources in the community that could assist with coping strategies and additional wellness options. Pt receives much joy from gardening and he shared pics of his garden today. CSW encouraged him to attend yoga and workshops open to cancer survivors. Pt also plans to contact [facility] for any co-pay assistance. CSW not aware of resources to assist re. Cancer as pt not currently in active treatment for cancer. He is currently in the follow up phase of his cancer care. CSW provided numbers and resources and encouraged pt to reach out as needed.   Clinical Social Work interventions: Supportive Psychiatric nurse and referral Loren Racer, [place] Worker [place]  [place] Phone: [phone] Fax: [phone]

## 2014-11-06 NOTE — Telephone Encounter (Signed)
Gave adn printed appt sched and avs fo rpt for Sept ...gave barium

## 2014-11-07 ENCOUNTER — Other Ambulatory Visit: Payer: Self-pay | Admitting: *Deleted

## 2014-11-07 DIAGNOSIS — Z85038 Personal history of other malignant neoplasm of large intestine: Secondary | ICD-10-CM

## 2014-11-07 LAB — CEA: CEA: 2.5 ng/mL (ref 0.0–5.0)

## 2014-11-07 NOTE — Telephone Encounter (Signed)
I cancelled the Rx

## 2014-11-07 NOTE — Telephone Encounter (Signed)
Dr Deatra Ina, This is what he called and stated yesterday

## 2014-11-07 NOTE — Addendum Note (Signed)
Addended by: Erskine Emery D on: 11/07/2014 10:36 AM   Modules accepted: Orders, Medications

## 2014-11-14 ENCOUNTER — Other Ambulatory Visit: Payer: Commercial Managed Care - HMO

## 2014-11-14 ENCOUNTER — Ambulatory Visit: Payer: Commercial Managed Care - HMO | Admitting: Oncology

## 2014-11-15 ENCOUNTER — Telehealth: Payer: Self-pay | Admitting: Gastroenterology

## 2014-11-15 ENCOUNTER — Encounter: Payer: Medicare HMO | Admitting: Gastroenterology

## 2014-11-18 ENCOUNTER — Encounter: Payer: Self-pay | Admitting: Gastroenterology

## 2014-12-30 ENCOUNTER — Ambulatory Visit (HOSPITAL_COMMUNITY): Payer: Medicaid Other

## 2014-12-30 ENCOUNTER — Other Ambulatory Visit: Payer: Self-pay

## 2015-01-06 ENCOUNTER — Other Ambulatory Visit: Payer: Self-pay | Admitting: *Deleted

## 2015-01-06 ENCOUNTER — Ambulatory Visit: Payer: Self-pay | Admitting: Oncology

## 2015-01-07 ENCOUNTER — Telehealth: Payer: Self-pay | Admitting: Oncology

## 2015-01-07 NOTE — Telephone Encounter (Signed)
Called patient due to no show and have rescheduled his appointment to the next available,the number to central scheduling was given to women on phone as he was angry and handed to phone to her

## 2015-01-14 ENCOUNTER — Ambulatory Visit (HOSPITAL_COMMUNITY): Admission: RE | Admit: 2015-01-14 | Payer: 59 | Source: Ambulatory Visit

## 2015-01-20 ENCOUNTER — Encounter: Payer: Self-pay | Admitting: Gastroenterology

## 2015-01-22 ENCOUNTER — Encounter: Payer: Self-pay | Admitting: Gastroenterology

## 2015-01-28 ENCOUNTER — Ambulatory Visit (HOSPITAL_COMMUNITY): Payer: 59

## 2015-01-29 ENCOUNTER — Telehealth: Payer: Self-pay | Admitting: *Deleted

## 2015-01-29 NOTE — Telephone Encounter (Signed)
Received fax from radiology, pt did not show for CT scan. Left message at home requesting he call office. Next office visit 10/31.

## 2015-02-10 ENCOUNTER — Ambulatory Visit: Payer: Self-pay | Admitting: Oncology

## 2015-03-24 ENCOUNTER — Ambulatory Visit: Payer: Medicare Other | Admitting: *Deleted

## 2015-03-26 ENCOUNTER — Ambulatory Visit (INDEPENDENT_AMBULATORY_CARE_PROVIDER_SITE_OTHER): Payer: Medicare Other | Admitting: Gastroenterology

## 2015-03-26 ENCOUNTER — Encounter: Payer: Self-pay | Admitting: Gastroenterology

## 2015-03-26 VITALS — BP 152/90 | HR 88 | Ht 70.0 in | Wt 201.6 lb

## 2015-03-26 DIAGNOSIS — R1012 Left upper quadrant pain: Secondary | ICD-10-CM | POA: Insufficient documentation

## 2015-03-26 DIAGNOSIS — Z1509 Genetic susceptibility to other malignant neoplasm: Secondary | ICD-10-CM | POA: Diagnosis not present

## 2015-03-26 DIAGNOSIS — Z85038 Personal history of other malignant neoplasm of large intestine: Secondary | ICD-10-CM | POA: Diagnosis not present

## 2015-03-26 MED ORDER — PANTOPRAZOLE SODIUM 40 MG PO TBEC: 40.0000 mg | DELAYED_RELEASE_TABLET | Freq: Every day | ORAL | Status: DC

## 2015-03-26 NOTE — Progress Notes (Signed)
     03/26/2015 Todd [name]. [id number] [birth year]   History of Present Illness:  This is a 59 year old male who is previously known to Dr. Deatra Ina. His care will be assumed by Dr. Havery Moros in Dr. Kelby Fam absence. He has Lynch syndrome and history of colon cancer in 2013. He was scheduled for a previsit to go over procedure instructions for his surveillance colonoscopy that is scheduled for later this month, however, due to complaints of left upper quadrant abdominal pain he was scheduled for office visit today as well. He complains of left upper quadrant abdominal pain for the past 2-3 months or so that is a burning sensation. He says that when he drinks hot liquids he can feel burning as soon as it touches his stomach. He admits that he has not been on any type of acid medication regularly for the past 3-4 months. He has a lot of indigestion as well and is eating a lot of Tums or Rolaids on a daily basis. He had an EGD in August 2015 that showed only a hiatal hernia. He get CT scans regularly as well and had one in June that was unremarkable. He was scheduled for another one in September, but he admits he did not proceed with that.   Current Medications, Allergies, Past Medical History, Past Surgical History, Family History and Social History were reviewed in [employer] record.   Physical Exam: BP 152/90 mmHg  Pulse 88  Ht 5\' 10"  (1.778 m)  Wt 201 lb 9.6 oz (91.445 kg)  BMI 28.93 kg/m2 General: Well developed black male in no acute distress Head: Normocephalic and atraumatic Eyes:  Sclerae anicteric, conjunctiva pink  Ears: Normal auditory acuity Lungs: Clear throughout to auscultation Heart: Regular rate and rhythm Abdomen: Soft, non-distended.  Normal bowel sounds.  Mild epigastric and LUQ TTP. Rectal:  Will be done at the time of colonoscopy. Musculoskeletal: Symmetrical with no gross deformities  Extremities: No edema  Neurological: Alert oriented x 4,  grossly non-focal Psychological:  Alert and cooperative. Normal mood and affect  Assessment and Recommendations: -59 year old male with Lynch syndrome and history of colon cancer in 2013.  Is already scheduled for colonoscopy surveillance later this month, but complaining of LUQ abdominal pain for the past 2-3 months.  We will place him back on PPI in the form of pantoprazole 40 mg daily, however, due to his increased risk of gastric cancer we will schedule for repeat EGD as well.  The risks, benefits, and alternatives to EGD and colonoscopy were discussed with the patient and he consents to proceed.

## 2015-03-26 NOTE — Progress Notes (Signed)
Agree with assessment and plan as outlined.  

## 2015-03-26 NOTE — Patient Instructions (Signed)

## 2015-04-04 ENCOUNTER — Ambulatory Visit (AMBULATORY_SURGERY_CENTER): Payer: Medicare Other | Admitting: Gastroenterology

## 2015-04-04 ENCOUNTER — Encounter: Payer: Self-pay | Admitting: Gastroenterology

## 2015-04-04 VITALS — BP 114/68 | HR 79 | Temp 97.4°F | Resp 18 | Ht 70.0 in | Wt 201.0 lb

## 2015-04-04 DIAGNOSIS — D132 Benign neoplasm of duodenum: Secondary | ICD-10-CM | POA: Diagnosis not present

## 2015-04-04 DIAGNOSIS — J449 Chronic obstructive pulmonary disease, unspecified: Secondary | ICD-10-CM | POA: Diagnosis not present

## 2015-04-04 DIAGNOSIS — Z800 Family history of malignant neoplasm of digestive organs: Secondary | ICD-10-CM

## 2015-04-04 DIAGNOSIS — D120 Benign neoplasm of cecum: Secondary | ICD-10-CM

## 2015-04-04 DIAGNOSIS — R109 Unspecified abdominal pain: Secondary | ICD-10-CM

## 2015-04-04 DIAGNOSIS — Z86010 Personal history of colonic polyps: Secondary | ICD-10-CM

## 2015-04-04 DIAGNOSIS — K635 Polyp of colon: Secondary | ICD-10-CM

## 2015-04-04 DIAGNOSIS — K319 Disease of stomach and duodenum, unspecified: Secondary | ICD-10-CM

## 2015-04-04 DIAGNOSIS — G894 Chronic pain syndrome: Secondary | ICD-10-CM | POA: Diagnosis not present

## 2015-04-04 DIAGNOSIS — D126 Benign neoplasm of colon, unspecified: Secondary | ICD-10-CM

## 2015-04-04 DIAGNOSIS — K317 Polyp of stomach and duodenum: Secondary | ICD-10-CM | POA: Diagnosis not present

## 2015-04-04 DIAGNOSIS — Z8500 Personal history of malignant neoplasm of unspecified digestive organ: Secondary | ICD-10-CM

## 2015-04-04 DIAGNOSIS — D125 Benign neoplasm of sigmoid colon: Secondary | ICD-10-CM | POA: Diagnosis not present

## 2015-04-04 DIAGNOSIS — K2950 Unspecified chronic gastritis without bleeding: Secondary | ICD-10-CM | POA: Diagnosis not present

## 2015-04-04 DIAGNOSIS — K2980 Duodenitis without bleeding: Secondary | ICD-10-CM | POA: Diagnosis not present

## 2015-04-04 DIAGNOSIS — Z85038 Personal history of other malignant neoplasm of large intestine: Secondary | ICD-10-CM | POA: Diagnosis not present

## 2015-04-04 DIAGNOSIS — R1012 Left upper quadrant pain: Secondary | ICD-10-CM

## 2015-04-04 DIAGNOSIS — D128 Benign neoplasm of rectum: Secondary | ICD-10-CM

## 2015-04-04 DIAGNOSIS — D123 Benign neoplasm of transverse colon: Secondary | ICD-10-CM

## 2015-04-04 MED ORDER — SODIUM CHLORIDE 0.9 % IV SOLN: 500.0000 mL | INTRAVENOUS | Status: DC

## 2015-04-04 NOTE — Progress Notes (Signed)
Called to room to assist during endoscopic procedure.  Patient ID and intended procedure confirmed with present staff. Received instructions for my participation in the procedure from the performing physician.  

## 2015-04-04 NOTE — Patient Instructions (Signed)

## 2015-04-04 NOTE — Progress Notes (Signed)
Report to PACU, RN, vss, BBS= Clear.  

## 2015-04-04 NOTE — Op Note (Signed)
[place]  [address]. [place], [id number]   COLONOSCOPY PROCEDURE REPORT  PATIENT: Vasilios, Batz  MR#: [id number] BIRTHDATE: 10-20-1955 , 59  yrs. old GENDER: male ENDOSCOPIST: Yetta Flock, MD REFERRED BY: Iona Beard MD PROCEDURE DATE:  04/04/2015 PROCEDURE:   Colonoscopy, surveillance , Colonoscopy with snare polypectomy, and Colonoscopy with biopsy First Screening Colonoscopy - Avg.  risk and is 50 yrs.  old or older - No.  Prior Negative Screening - Now for repeat screening. N/A  History of Adenoma - Now for follow-up colonoscopy & has been > or = to 3 yrs.  No.  It has been less than 3 yrs since last colonoscopy.  Other: See Comments  Polyps removed today? Yes ASA CLASS:   Class III INDICATIONS:Surveillance due to prior colonic neoplasia, PH Colon or Rectal Adenocarcinoma, and FH Colon or Rectal Adenocarcinoma. History of Lynch syndrome MEDICATIONS: Propofol 230 mg IV  DESCRIPTION OF PROCEDURE:   After the risks benefits and alternatives of the procedure were thoroughly explained, informed consent was obtained.  The digital rectal exam revealed no abnormalities of the rectum.   The LB [id number]  endoscope was introduced through the anus and advanced to the cecum, which was identified by both the appendix and ileocecal valve. No adverse events experienced.   The quality of the prep was adequate  The instrument was then slowly withdrawn as the colon was fully examined. Estimated blood loss is zero unless otherwise noted in this procedure report.   COLON FINDINGS: A diminutive sessile polyp was noted in the cecum and removed via cold forceps.  A suspected surgical anastomosis was noted in the distal transverse colon, with some subtle polypoid nodularity noted along one short segment of the anastomosis - biopsies taken to ensure normal, it did not appear adenomatous.  A 8-42mm sessile polyp was noted in the splenic flexure and removed via  hot snare.  One 6mm sessile polyp was noted in the sigmoid colon and removed via cold snare.  A 7mm sessile sigmoid polyp was noted in and removed via cold forceps.  A 3-61mm rectal polyp was noted on retroflexion and removed with cold forceps.  Mild diverticulosis was noted throughout the examined colon. Retroflexed views revealed internal hemorrhoids. the colon did not retain air well and associated with spasm, thus the prolonged time of the procedure. The time to cecum = 2.5 Withdrawal time = 29.1 The scope was withdrawn and the procedure completed. COMPLICATIONS: There were no immediate complications.  ENDOSCOPIC IMPRESSION: Multiple colon polyps noted as outlined above, removed Mild pandiverticulosis  RECOMMENDATIONS: No aspirin or NSAIDs for 2 weeks post polypectomy Await pathology results Resume diet  eSigned:  Yetta Flock, MD 04/04/2015 9:31 AM   cc: Iona Beard MD, the patient   PATIENT NAME:  Normand, Doll MR#: [id number]

## 2015-04-04 NOTE — Op Note (Signed)
[place]  [address]. [place], [id number]   ENDOSCOPY PROCEDURE REPORT  PATIENT: [name], Todd  MR#: [id number] BIRTHDATE: 1955/09/19 , 59  yrs. old GENDER: male ENDOSCOPIST: Yetta Flock, MD REFERRED BY:  Iona Beard MD PROCEDURE DATE:  04/04/2015 PROCEDURE:  EGD w/ snare polypectomy and EGD w/ biopsy ASA CLASS:     Class III INDICATIONS:  abdominal pain in upper left quadrant and history of Lynch syndrome. MEDICATIONS: Propofol 400 mg IV and Lidocaine 180 mg IV TOPICAL ANESTHETIC:  DESCRIPTION OF PROCEDURE: After the risks benefits and alternatives of the procedure were thoroughly explained, informed consent was obtained.  The LB [id number] endoscope was introduced through the mouth and advanced to the second portion of the duodenum , Without limitations.  The instrument was slowly withdrawn as the mucosa was fully examined.  FINDINGS: There was LA grade A esophagitis noted at the GEJ.  The esophagus was otherwise normal.  The DH was noted 40cm from the incisors, with SCJ and GEJ noted 35cm from the incisors, with a 5cm hiatal hernia.  The stomach was normal but biopsies were taken from the antrum and body to rule out H pylori in light of the patient's symptoms.  Two x 22mm sessile polyps were noted in the 2nd portion of the duodenum and removed with cold forceps.  A 6-35mm sessile polyp was noted in the duodenal bulb and removed via hot snare.  A suspect prominent fold was noted just beyond the duodenal bulb, but biopsies were taken to ensure no adenomatous changes.  The remainder of the duodenum was normal.  Retroflexed views revealed no abnormalities.     The scope was then withdrawn from the patient and the procedure completed.  COMPLICATIONS: There were no immediate complications.  ENDOSCOPIC IMPRESSION: LA grade A esophagitis 5cm hiatal hernia Normal stomach, biopsies taken to rule out H pylori 3 suspected duodenal adenomas  removed, the largest via hot snare One suspected prominent fold in the duodenum, biopsies taken to ensure no adenomatous change  RECOMMENDATIONS: No aspirin or NSAIDS for 2 weeks Increase protonix to twice daily dosing for 2 weeks in light of esophagitis noted on this exam Await pathology results Resume diet  REPEAT EXAM:  eSigned:  Yetta Flock, MD 04/04/2015 9:22 AM    CC: Iona Beard MD, the patient  PATIENT NAME:  Todd, [name] MR#: [id number]

## 2015-04-08 ENCOUNTER — Telehealth: Payer: Self-pay | Admitting: *Deleted

## 2015-04-08 NOTE — Telephone Encounter (Signed)
  Follow up Call-  Call back number 04/04/2015 12/03/2013  Post procedure Call Back phone  # [phone]  Permission to leave phone message Yes Yes     Patient questions:  Do you have a fever, pain , or abdominal swelling? No. Pain Score  0 *  Have you tolerated food without any problems? Yes.    Have you been able to return to your normal activities? Yes.    Do you have any questions about your discharge instructions: Diet   No. Medications  No. Follow up visit  Yes.    Do you have questions or concerns about your Care? No.  Actions: * If pain score is 4 or above:pt. Just wonders when he will need to come back because he had"9 polyps" this time.  I advised him that he will be notified through a letter within a couple of weeks.  He will call our office if he has not Received notification about polyps and return app. In 3 weeks. No action needed, pain <4.

## 2015-04-13 ENCOUNTER — Encounter: Payer: Self-pay | Admitting: Gastroenterology

## 2015-05-27 ENCOUNTER — Other Ambulatory Visit: Payer: Self-pay | Admitting: Urology

## 2015-05-27 DIAGNOSIS — R310 Gross hematuria: Secondary | ICD-10-CM | POA: Diagnosis not present

## 2015-05-27 DIAGNOSIS — C672 Malignant neoplasm of lateral wall of bladder: Secondary | ICD-10-CM | POA: Diagnosis not present

## 2015-05-27 DIAGNOSIS — Z0000 Encounter for general adult medical examination without abnormal findings: Secondary | ICD-10-CM | POA: Diagnosis not present

## 2015-06-02 ENCOUNTER — Encounter (HOSPITAL_BASED_OUTPATIENT_CLINIC_OR_DEPARTMENT_OTHER): Payer: Self-pay | Admitting: *Deleted

## 2015-06-03 ENCOUNTER — Encounter (HOSPITAL_BASED_OUTPATIENT_CLINIC_OR_DEPARTMENT_OTHER): Payer: Self-pay | Admitting: *Deleted

## 2015-06-04 ENCOUNTER — Encounter (HOSPITAL_BASED_OUTPATIENT_CLINIC_OR_DEPARTMENT_OTHER): Payer: Self-pay | Admitting: *Deleted

## 2015-06-04 NOTE — Progress Notes (Signed)
NPO AFTER MN.  ARRIVE AT 0900.  NEEDS HG. WILL TAKE METHADONE AND PROTONIX AM DOS W/ SIPS OF WATER.

## 2015-06-06 ENCOUNTER — Ambulatory Visit (HOSPITAL_BASED_OUTPATIENT_CLINIC_OR_DEPARTMENT_OTHER): Payer: Medicare Other | Admitting: Anesthesiology

## 2015-06-06 ENCOUNTER — Encounter (HOSPITAL_BASED_OUTPATIENT_CLINIC_OR_DEPARTMENT_OTHER): Payer: Self-pay | Admitting: *Deleted

## 2015-06-06 ENCOUNTER — Encounter (HOSPITAL_BASED_OUTPATIENT_CLINIC_OR_DEPARTMENT_OTHER)
Admission: RE | Disposition: A | Discharge status: Home / Self Care | Payer: Self-pay | Source: Ambulatory Visit | Attending: Urology

## 2015-06-06 ENCOUNTER — Ambulatory Visit (HOSPITAL_BASED_OUTPATIENT_CLINIC_OR_DEPARTMENT_OTHER)
Admission: RE | Admit: 2015-06-06 | Discharge: 2015-06-06 | Disposition: A | Discharge status: Home / Self Care | Payer: Medicare Other | Source: Ambulatory Visit | Attending: Urology | Admitting: Urology

## 2015-06-06 DIAGNOSIS — M1990 Unspecified osteoarthritis, unspecified site: Secondary | ICD-10-CM | POA: Diagnosis not present

## 2015-06-06 DIAGNOSIS — D090 Carcinoma in situ of bladder: Secondary | ICD-10-CM | POA: Insufficient documentation

## 2015-06-06 DIAGNOSIS — K219 Gastro-esophageal reflux disease without esophagitis: Secondary | ICD-10-CM | POA: Insufficient documentation

## 2015-06-06 DIAGNOSIS — C679 Malignant neoplasm of bladder, unspecified: Secondary | ICD-10-CM | POA: Diagnosis not present

## 2015-06-06 DIAGNOSIS — Z79899 Other long term (current) drug therapy: Secondary | ICD-10-CM | POA: Insufficient documentation

## 2015-06-06 DIAGNOSIS — R310 Gross hematuria: Secondary | ICD-10-CM | POA: Diagnosis not present

## 2015-06-06 DIAGNOSIS — Z7951 Long term (current) use of inhaled steroids: Secondary | ICD-10-CM | POA: Diagnosis not present

## 2015-06-06 DIAGNOSIS — C678 Malignant neoplasm of overlapping sites of bladder: Secondary | ICD-10-CM | POA: Diagnosis not present

## 2015-06-06 DIAGNOSIS — Z79891 Long term (current) use of opiate analgesic: Secondary | ICD-10-CM | POA: Diagnosis not present

## 2015-06-06 DIAGNOSIS — M109 Gout, unspecified: Secondary | ICD-10-CM | POA: Diagnosis not present

## 2015-06-06 DIAGNOSIS — Z85038 Personal history of other malignant neoplasm of large intestine: Secondary | ICD-10-CM | POA: Diagnosis not present

## 2015-06-06 DIAGNOSIS — F329 Major depressive disorder, single episode, unspecified: Secondary | ICD-10-CM | POA: Diagnosis not present

## 2015-06-06 DIAGNOSIS — F17210 Nicotine dependence, cigarettes, uncomplicated: Secondary | ICD-10-CM | POA: Insufficient documentation

## 2015-06-06 DIAGNOSIS — J449 Chronic obstructive pulmonary disease, unspecified: Secondary | ICD-10-CM | POA: Insufficient documentation

## 2015-06-06 DIAGNOSIS — Z8551 Personal history of malignant neoplasm of bladder: Secondary | ICD-10-CM | POA: Insufficient documentation

## 2015-06-06 DIAGNOSIS — D494 Neoplasm of unspecified behavior of bladder: Secondary | ICD-10-CM

## 2015-06-06 HISTORY — DX: Personal history of colonic polyps: Z86.010

## 2015-06-06 HISTORY — DX: Unspecified symptoms and signs involving the genitourinary system: R39.9

## 2015-06-06 HISTORY — PX: TRANSURETHRAL RESECTION OF BLADDER TUMOR WITH GYRUS (TURBT-GYRUS): SHX6458

## 2015-06-06 HISTORY — DX: Personal history of adenomatous and serrated colon polyps: Z86.0101

## 2015-06-06 HISTORY — PX: CYSTOSCOPY W/ RETROGRADES: SHX1426

## 2015-06-06 HISTORY — DX: Unspecified chronic gastritis without bleeding: K29.50

## 2015-06-06 HISTORY — DX: Malignant neoplasm of bladder, unspecified: C67.9

## 2015-06-06 LAB — POCT HEMOGLOBIN-HEMACUE: Hemoglobin: 16.2 g/dL (ref 13.0–17.0)

## 2015-06-06 SURGERY — TRANSURETHRAL RESECTION OF BLADDER TUMOR WITH GYRUS (TURBT-GYRUS)
Anesthesia: General | Site: Renal

## 2015-06-06 MED ORDER — NEOSTIGMINE METHYLSULFATE 10 MG/10ML IV SOLN
INTRAVENOUS | Status: DC | PRN
  Administered 2015-06-06: 3 mg via INTRAVENOUS

## 2015-06-06 MED ORDER — ROCURONIUM BROMIDE 100 MG/10ML IV SOLN
INTRAVENOUS | Status: AC
  Filled 2015-06-06: qty 1

## 2015-06-06 MED ORDER — ONDANSETRON HCL 4 MG/2ML IJ SOLN
INTRAMUSCULAR | Status: AC
  Filled 2015-06-06: qty 2

## 2015-06-06 MED ORDER — SODIUM CHLORIDE 0.9 % IR SOLN
Status: DC | PRN
  Administered 2015-06-06: 3000 mL
  Administered 2015-06-06: 6000 mL
  Administered 2015-06-06: 1000 mL
  Administered 2015-06-06: 6000 mL

## 2015-06-06 MED ORDER — ROCURONIUM BROMIDE 100 MG/10ML IV SOLN
INTRAVENOUS | Status: DC | PRN
  Administered 2015-06-06: 25 mg via INTRAVENOUS

## 2015-06-06 MED ORDER — BELLADONNA ALKALOIDS-OPIUM 16.2-60 MG RE SUPP
1.0000 | Freq: Every day | RECTAL | Status: DC
  Administered 2015-06-06: 1 via RECTAL
  Filled 2015-06-06: qty 1

## 2015-06-06 MED ORDER — PHENAZOPYRIDINE HCL 200 MG PO TABS
200.0000 mg | ORAL_TABLET | Freq: Once | ORAL | Status: AC
  Administered 2015-06-06: 200 mg via ORAL
  Filled 2015-06-06: qty 1

## 2015-06-06 MED ORDER — PROPOFOL 10 MG/ML IV BOLUS
INTRAVENOUS | Status: DC | PRN
  Administered 2015-06-06: 180 mg via INTRAVENOUS

## 2015-06-06 MED ORDER — DEXAMETHASONE SODIUM PHOSPHATE 10 MG/ML IJ SOLN
INTRAMUSCULAR | Status: AC
  Filled 2015-06-06: qty 1

## 2015-06-06 MED ORDER — FENTANYL CITRATE (PF) 100 MCG/2ML IJ SOLN
25.0000 ug | INTRAMUSCULAR | Status: DC | PRN
  Administered 2015-06-06: 50 ug via INTRAVENOUS
  Filled 2015-06-06: qty 1

## 2015-06-06 MED ORDER — ACETAMINOPHEN 160 MG/5ML PO SOLN
ORAL | Status: AC
  Filled 2015-06-06: qty 40.6

## 2015-06-06 MED ORDER — MITOMYCIN CHEMO FOR BLADDER INSTILLATION 40 MG
40.0000 mg | Freq: Once | INTRAVENOUS | Status: AC
  Administered 2015-06-06: 40 mg via INTRAVESICAL
  Filled 2015-06-06: qty 40

## 2015-06-06 MED ORDER — ACETAMINOPHEN 160 MG/5ML PO SOLN
975.0000 mg | Freq: Once | ORAL | Status: AC
  Administered 2015-06-06: 975 mg via ORAL
  Filled 2015-06-06: qty 30.5

## 2015-06-06 MED ORDER — FENTANYL CITRATE (PF) 100 MCG/2ML IJ SOLN
INTRAMUSCULAR | Status: AC
  Filled 2015-06-06: qty 2

## 2015-06-06 MED ORDER — FENTANYL CITRATE (PF) 100 MCG/2ML IJ SOLN
25.0000 ug | Freq: Once | INTRAMUSCULAR | Status: AC
  Administered 2015-06-06: 25 ug via INTRAVENOUS
  Filled 2015-06-06: qty 0.5

## 2015-06-06 MED ORDER — GLYCOPYRROLATE 0.2 MG/ML IJ SOLN
INTRAMUSCULAR | Status: AC
  Filled 2015-06-06: qty 2

## 2015-06-06 MED ORDER — PROPOFOL 10 MG/ML IV BOLUS
INTRAVENOUS | Status: AC
  Filled 2015-06-06: qty 20

## 2015-06-06 MED ORDER — CIPROFLOXACIN IN D5W 400 MG/200ML IV SOLN
INTRAVENOUS | Status: AC
  Filled 2015-06-06: qty 200

## 2015-06-06 MED ORDER — FENTANYL CITRATE (PF) 250 MCG/5ML IJ SOLN
INTRAMUSCULAR | Status: AC
  Filled 2015-06-06: qty 5

## 2015-06-06 MED ORDER — MIDAZOLAM HCL 2 MG/2ML IJ SOLN
INTRAMUSCULAR | Status: DC | PRN
Start: 2015-06-06 — End: 2015-06-06
  Administered 2015-06-06: 2 mg via INTRAVENOUS

## 2015-06-06 MED ORDER — NEOSTIGMINE METHYLSULFATE 10 MG/10ML IV SOLN
INTRAVENOUS | Status: AC
  Filled 2015-06-06: qty 1

## 2015-06-06 MED ORDER — PHENAZOPYRIDINE HCL 100 MG PO TABS
ORAL_TABLET | ORAL | Status: AC
  Filled 2015-06-06: qty 2

## 2015-06-06 MED ORDER — LIDOCAINE HCL (CARDIAC) 20 MG/ML IV SOLN
INTRAVENOUS | Status: DC | PRN
  Administered 2015-06-06: 60 mg via INTRAVENOUS

## 2015-06-06 MED ORDER — BELLADONNA ALKALOIDS-OPIUM 16.2-60 MG RE SUPP
RECTAL | Status: AC
  Filled 2015-06-06: qty 1

## 2015-06-06 MED ORDER — MIDAZOLAM HCL 2 MG/2ML IJ SOLN
INTRAMUSCULAR | Status: AC
  Filled 2015-06-06: qty 2

## 2015-06-06 MED ORDER — DEXAMETHASONE SODIUM PHOSPHATE 4 MG/ML IJ SOLN
INTRAMUSCULAR | Status: DC | PRN
  Administered 2015-06-06: 10 mg via INTRAVENOUS

## 2015-06-06 MED ORDER — PHENAZOPYRIDINE HCL 200 MG PO TABS: 200.0000 mg | ORAL_TABLET | Freq: Three times a day (TID) | ORAL | Status: DC | PRN

## 2015-06-06 MED ORDER — LACTATED RINGERS IV SOLN
INTRAVENOUS | Status: DC
  Administered 2015-06-06 (×2): via INTRAVENOUS
  Filled 2015-06-06: qty 1000

## 2015-06-06 MED ORDER — GLYCOPYRROLATE 0.2 MG/ML IJ SOLN
INTRAMUSCULAR | Status: AC
  Filled 2015-06-06: qty 1

## 2015-06-06 MED ORDER — GLYCOPYRROLATE 0.2 MG/ML IJ SOLN
INTRAMUSCULAR | Status: DC | PRN
  Administered 2015-06-06: 0.4 mg via INTRAVENOUS

## 2015-06-06 MED ORDER — STERILE WATER FOR IRRIGATION IR SOLN
Status: DC | PRN
  Administered 2015-06-06: 1000 mL

## 2015-06-06 MED ORDER — FENTANYL CITRATE (PF) 100 MCG/2ML IJ SOLN
25.0000 ug | Freq: Once | INTRAMUSCULAR | Status: AC
  Administered 2015-06-06 (×2): 50 ug via INTRAVENOUS
  Administered 2015-06-06: 25 ug via INTRAVENOUS
  Administered 2015-06-06 (×2): 50 ug via INTRAVENOUS
  Administered 2015-06-06: 25 ug via INTRAVENOUS
  Filled 2015-06-06: qty 0.5

## 2015-06-06 MED ORDER — IOHEXOL 350 MG/ML SOLN
INTRAVENOUS | Status: DC | PRN
  Administered 2015-06-06: 20 mL

## 2015-06-06 MED ORDER — LIDOCAINE HCL (CARDIAC) 20 MG/ML IV SOLN
INTRAVENOUS | Status: AC
  Filled 2015-06-06: qty 5

## 2015-06-06 MED ORDER — CIPROFLOXACIN IN D5W 400 MG/200ML IV SOLN
400.0000 mg | INTRAVENOUS | Status: AC
  Administered 2015-06-06: 400 mg via INTRAVENOUS
  Filled 2015-06-06: qty 200

## 2015-06-06 SURGICAL SUPPLY — 58 items
ADAPTER CATH URET PLST 4-6FR (CATHETERS) IMPLANT
ADPR CATH URET STRL DISP 4-6FR (CATHETERS)
BAG DRAIN URO-CYSTO SKYTR STRL (DRAIN) ×3 IMPLANT
BAG DRN ANRFLXCHMBR STRAP LEK (BAG)
BAG DRN UROCATH (DRAIN) ×2
BAG URINE DRAINAGE (UROLOGICAL SUPPLIES) IMPLANT
BAG URINE LEG 19OZ MD ST LTX (BAG) IMPLANT
BASKET LASER NITINOL 1.9FR (BASKET) IMPLANT
BASKET STNLS GEMINI 4WIRE 3FR (BASKET) IMPLANT
BASKET ZERO TIP NITINOL 2.4FR (BASKET) IMPLANT
BSKT STON RTRVL 120 1.9FR (BASKET)
BSKT STON RTRVL GEM 120X11 3FR (BASKET)
BSKT STON RTRVL ZERO TP 2.4FR (BASKET)
CATH FOLEY 2WAY SLVR  5CC 20FR (CATHETERS) ×1
CATH FOLEY 2WAY SLVR 5CC 20FR (CATHETERS) IMPLANT
CATH FOLEY 3WAY 30CC 24FR (CATHETERS)
CATH HEMA 3WAY 30CC 24FR COUDE (CATHETERS) IMPLANT
CATH HEMA 3WAY 30CC 24FR RND (CATHETERS) IMPLANT
CATH INTERMIT  6FR 70CM (CATHETERS) ×1 IMPLANT
CATH URET 5FR 28IN CONE TIP (BALLOONS)
CATH URET 5FR 70CM CONE TIP (BALLOONS) IMPLANT
CATH URTH STD 24FR FL 3W 2 (CATHETERS) ×2 IMPLANT
CLOTH BEACON ORANGE TIMEOUT ST (SAFETY) ×3 IMPLANT
ELECT BIVAP BIPO 22/24 DONUT (ELECTROSURGICAL)
ELECT BUTTON BIOP 24F 90D PLAS (MISCELLANEOUS) IMPLANT
ELECT REM PT RETURN 9FT ADLT (ELECTROSURGICAL) ×3
ELECTRD BIVAP BIPO 22/24 DONUT (ELECTROSURGICAL) ×2 IMPLANT
ELECTRODE REM PT RTRN 9FT ADLT (ELECTROSURGICAL) ×2 IMPLANT
EVACUATOR MICROVAS BLADDER (UROLOGICAL SUPPLIES) IMPLANT
GLOVE BIO SURGEON STRL SZ 6.5 (GLOVE) ×4 IMPLANT
GLOVE BIO SURGEON STRL SZ8 (GLOVE) ×3 IMPLANT
GLOVE INDICATOR 6.5 STRL GRN (GLOVE) ×4 IMPLANT
GOWN STRL REUS W/ TWL LRG LVL3 (GOWN DISPOSABLE) ×2 IMPLANT
GOWN STRL REUS W/ TWL XL LVL3 (GOWN DISPOSABLE) ×2 IMPLANT
GOWN STRL REUS W/TWL LRG LVL3 (GOWN DISPOSABLE) ×6
GOWN STRL REUS W/TWL XL LVL3 (GOWN DISPOSABLE) ×3
GUIDEWIRE 0.038 PTFE COATED (WIRE) IMPLANT
GUIDEWIRE ANG ZIPWIRE 038X150 (WIRE) IMPLANT
GUIDEWIRE STR DUAL SENSOR (WIRE) ×3 IMPLANT
HOLDER FOLEY CATH W/STRAP (MISCELLANEOUS) ×1 IMPLANT
IV NS 1000ML (IV SOLUTION) ×3
IV NS 1000ML BAXH (IV SOLUTION) IMPLANT
IV NS IRRIG 3000ML ARTHROMATIC (IV SOLUTION) ×3 IMPLANT
KIT BALLIN UROMAX 15FX10 (LABEL) IMPLANT
KIT BALLN UROMAX 15FX4 (MISCELLANEOUS) IMPLANT
KIT BALLN UROMAX 26 75X4 (MISCELLANEOUS)
KIT ROOM TURNOVER WOR (KITS) ×3 IMPLANT
LOOP CUT BIPOLAR 24F LRG (ELECTROSURGICAL) ×3 IMPLANT
MANIFOLD NEPTUNE II (INSTRUMENTS) IMPLANT
PACK CYSTO (CUSTOM PROCEDURE TRAY) ×3 IMPLANT
PLUG CATH AND CAP STER (CATHETERS) ×1 IMPLANT
SET ASPIRATION TUBING (TUBING) ×3 IMPLANT
SET HIGH PRES BAL DIL (LABEL)
SYR 30ML LL (SYRINGE) IMPLANT
SYRINGE IRR TOOMEY STRL 70CC (SYRINGE) IMPLANT
TUBE CONNECTING 12X1/4 (SUCTIONS) ×1 IMPLANT
WATER STERILE IRR 3000ML UROMA (IV SOLUTION) IMPLANT
WATER STERILE IRR 500ML POUR (IV SOLUTION) ×1 IMPLANT

## 2015-06-06 NOTE — Op Note (Addendum)
PATIENT:  Todd [name].  PRE-OPERATIVE DIAGNOSIS: Bladder tumors with gross hematuria  POST-OPERATIVE DIAGNOSIS: Same  PROCEDURE:  Procedure(s): 1. TRANSURETHRAL RESECTION OF BLADDER TUMOR (TURBT) (2.6cm.) 2. Instillation of intravesical chemotherapy (mitomycin-C) 3.  Bilateral retrograde pyelograms with interpretation  SURGEON:  Surgeon(s): Claybon Jabs  ANESTHESIA:   General  EBL:  Minimal  DRAINS: Urethral catheter (20 Fr. Foley)   SPECIMEN:  Bladder tumor  DISPOSITION OF SPECIMEN:  PATHOLOGY  Indication:  Mr. Dandurand is a 61 year old male with a history of transitional cell carcinoma. He was originally diagnosed and then found to have recurrence. After that I recommended intravesical BCG and follow-up cystoscopy however he only underwent 2 of his scheduled 6 BCG instillations and then did not return for his follow-up cystoscopy. He now has returned with recurrence identified cystoscopically in my office. He is brought to the operating room for resection of his bladder tumors and intravesical mitomycin C instillation.  Description of operation: The patient was taken to the operating room and administered general anesthesia. They were then placed on the table and moved to the dorsal lithotomy position after which the genitalia was sterilely prepped and draped. An official timeout was then performed.  The 35 French resectoscope with the 30 lens and visual obturator were then passed into the bladder under direct visualization. Urethra appeared normal. The visual obturator was then removed and the Gyrus resectoscope element with 30  lens was then inserted and the bladder was fully and systematically inspected. Ureteral orifices were noted to be in the normal anatomic positions. I noted 3 tumors at the dome of the bladder and one on the right wall as well as an area of erythematous, worrisome looking mucosa on the posterior wall the bladder. The prostatic urethra was noted to be free  of any lesions.  I switched temporarily to the 23 French cystoscope and 30 lens.  I passed a 6 [place] open-ended ureteral catheter through the cystoscope and initially into the right ureteral orifice.  Full strength Omnipaque contrast was then injected through the open-ended catheter and up the right ureter under direct fluoroscopy which revealed a normal ureter throughout its length without filling defect or mass effect, hydronephrosis or other abnormality.   I then turned my attention to the left ureteral orifice and performed a left retrograde pyelogram in identical fashion.  This also revealed no abnormality of the ureter throughout its length and no abnormality of the intrarenal collecting system.  I therefore removed the cystoscope and reinserted the resectoscope.  I then began by resecting tumors at the dome. They had an obvious stalk and I photographed one of these demonstrating this. I resected the 3 tumors at the dome and then resected the tumor on the right wall of the bladder. I fulgurated the base and the mucosa surrounding the area of resection of each of the tumors.  I then inserted the cold cup biopsy forceps and obtained a cold cup biopsy from a reddened patch on the posterior wall to the left of midline, from the posterior wall in the midline which was the largest area of abnormal appearing mucosa and then also a biopsy from the right wall of the bladder. I then reinserted the resectoscope element and fulgurated the biopsy sites and then the area that was worrisome on the posterior wall was resected and fulgurated in order to completely remove all of the abnormal appearing mucosa.   Reinspection of the bladder revealed all obvious tumor had been fully resected and  there was no evidence of perforation. The Microvasive evacuator was then used to irrigate the bladder and remove all of the portions of bladder tumor which were sent to pathology. I then removed the resectoscope.  A 20 French  Foley catheter was then inserted in the bladder and irrigated. The irrigant returned slightly pink with no clots. The patient was awakened and taken to the recovery room.  While in the recovery room 40 mg of mitomycin-C in 40 cc of water were instilled in the bladder through the catheter and the catheter was plugged. This will remain indwelling for approximately one hour. It will then be drained from the bladder and the catheter will be removed and the patient discharged home.  PLAN OF CARE: Discharge to home after PACU  PATIENT DISPOSITION:  PACU - hemodynamically stable.

## 2015-06-06 NOTE — Transfer of Care (Signed)
Immediate Anesthesia Transfer of Care Note  Patient: Todd [name].  Procedure(s) Performed: Procedure(s) (LRB): TRANSURETHRAL RESECTION OF BLADDER TUMOR WITH GYRUS (TURBT-GYRUS) (N/A) BILATERAL  RETROGRADE PYELOGRAM (Bilateral)  Patient Location: PACU  Anesthesia Type: General  Level of Consciousness: awake, oriented, sedated and patient cooperative  Airway & Oxygen Therapy: Patient Spontanous Breathing and Patient connected to face mask oxygen  Post-op Assessment: Report given to PACU RN and Post -op Vital signs reviewed and stable  Post vital signs: Reviewed and stable  Complications: No apparent anesthesia complications

## 2015-06-06 NOTE — Anesthesia Procedure Notes (Signed)
Procedure Name: LMA Insertion Date/Time: 06/06/2015 11:01 AM Performed by: Denna Haggard D Pre-anesthesia Checklist: Patient identified, Emergency Drugs available, Suction available and Patient being monitored Patient Re-evaluated:Patient Re-evaluated prior to inductionOxygen Delivery Method: Circle System Utilized Preoxygenation: Pre-oxygenation with 100% oxygen Intubation Type: IV induction Ventilation: Mask ventilation without difficulty LMA: LMA inserted LMA Size: 4.0 Number of attempts: 1 Airway Equipment and Method: Bite block Placement Confirmation: positive ETCO2 Tube secured with: Tape Dental Injury: Teeth and Oropharynx as per pre-operative assessment

## 2015-06-06 NOTE — Anesthesia Preprocedure Evaluation (Addendum)
Anesthesia Evaluation  Patient identified by MRN, date of birth, ID band Patient awake    Reviewed: Allergy & Precautions, H&P , Patient's Chart, lab work & pertinent test results, reviewed documented beta blocker date and time   Airway Mallampati: II  TM Distance: >3 FB Neck ROM: full    Dental no notable dental hx.    Pulmonary COPD, Current Smoker,    Pulmonary exam normal breath sounds clear to auscultation       Cardiovascular  Rhythm:regular Rate:Normal     Neuro/Psych    GI/Hepatic   Endo/Other    Renal/GU      Musculoskeletal   Abdominal   Peds  Hematology   Anesthesia Other Findings COPD (chronic obstructive pulmonary disease) (HCC) GERD     Depression      Arthritis      History of colon cancer         Reproductive/Obstetrics                           Anesthesia Physical Anesthesia Plan  ASA: II  Anesthesia Plan:    Post-op Pain Management:    Induction: Intravenous  Airway Management Planned: LMA  Additional Equipment:   Intra-op Plan:   Post-operative Plan:   Informed Consent: I have reviewed the patients History and Physical, chart, labs and discussed the procedure including the risks, benefits and alternatives for the proposed anesthesia with the patient or authorized representative who has indicated his/her understanding and acceptance.   Dental Advisory Given and Dental advisory given  Plan Discussed with: CRNA and Surgeon  Anesthesia Plan Comments: (#5 LMA used prior Chronic opiate exposure Discussed GA with LMA, possible sore throat, potential need to switch to ETT, N/V, pulmonary aspiration. Questions answered. )       Anesthesia Quick Evaluation                                  Anesthesia Evaluation  Patient identified by MRN, date of birth, ID band Patient awake    Reviewed: Allergy & Precautions, NPO status , Patient's Chart, lab work &  pertinent test results  History of Anesthesia Complications Negative for: history of anesthetic complications  Airway        Dental   Pulmonary shortness of breath, COPDCurrent Smoker,          Cardiovascular negative cardio ROS      Neuro/Psych negative neurological ROS     GI/Hepatic GERD-  ,S/p colon ca   Endo/Other  negative endocrine ROS  Renal/GU Bladder ca     Musculoskeletal   Abdominal   Peds  Hematology negative hematology ROS (+)   Anesthesia Other Findings   Reproductive/Obstetrics                             Anesthesia Physical Anesthesia Plan  ASA: II  Anesthesia Plan: General   Post-op Pain Management:    Induction: Intravenous  Airway Management Planned: LMA  Additional Equipment:   Intra-op Plan:   Post-operative Plan: Extubation in OR  Informed Consent: I have reviewed the patients History and Physical, chart, labs and discussed the procedure including the risks, benefits and alternatives for the proposed anesthesia with the patient or authorized representative who has indicated his/her understanding and acceptance.   Dental advisory given  Plan Discussed with: CRNA and Surgeon  Anesthesia Plan Comments:         Anesthesia Quick Evaluation

## 2015-06-06 NOTE — H&P (Signed)
Todd [name] is a 60 year old male with recurrent transitional cell carcinoma bladder.   History of Present Illness  Transitional cell carcinoma of the bladder: A CT A/P was obtained which revealed a 10 mm left bladder wall mass. No evidence of extravesical extension or adenopathy/metastatic disease.   Risk factors: He has been a smoker in the past smoking as much as one half a pack per day. He has decreased his smoking to 2-3 cigarettes per day but continues to smoke at this time.  TURBT 10/26/13: He underwent resection of his bladder tumor with postoperative mitomycin C.  Pathology: Papillary TCCa (Ta,G1)  Repeat TURBT 05/27/14: He was found to have several areas of recurrent papillary lesions which were resected and other areas of the bladder were biopsied.  Pathology: Papillary, superficial TCCa (Ta,G1).  6 week induction course of BCG    LUTS - He noted an increase in his nocturia 3-4 times but did not note significant daytime frequency. He also has experienced some suprapubic pressure-type sensation in the area of the bladder. In addition he has noted some occasional intermittency and hesitancy.    Prostatic urethral pain/tenderness: He has experienced significant discomfort in the area of the prostatic urethra during cystoscopy. In 2/16 I started him on meloxicam and Septra DS and proceeded with evaluation with cystoscopy under anesthesia and a biopsy of his prostatic urethra in 2/16 which revealed no significant abnormality.    Interval history: When I saw him last in 4/16 I recommended an induction course of BCG and follow-up cystoscopy in 3 months. He underwent 2 treatments of BCG and was very ugly to my staff. He refused to return for further treatments and also did not return for surveillance cystoscopy as I had recommended. He came in today indicating that he has been experiencing gross hematuria for 2 weeks. This is not associated with any flank pain. He has had some pain in  the area of the bladder as well. His gross hematuria would be considered of moderate severity with no modifying factors or associated signs and symptoms other than as noted above.      Past Medical History Problems  1. History of chronic obstructive lung disease (Z87.09) 2. History of depression (Z86.59) 3. History of esophageal reflux (Z87.19) 4. History of gout (Z87.39) 5. History of malignant neoplasm of colon (Z85.038) 6. 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 History of Back Surgery 2. History of Colon Surgery 3. History of Cystoscopy With Fulguration Small Lesion (5-80mm) 4. History of Cystoscopy With Fulguration Small Lesion (5-58mm) 5. History of Gallbladder Surgery  Current Meds 1. BuPROPion HCl ER (XL) 150 MG Oral Tablet Extended Release 24 Hour;  Therapy: (Recorded:22Jul2015) to Recorded 2. Gabapentin 300 MG Oral Capsule; TAKE 1 CAPSULE 3 TIMES DAILY;  Therapy: [id number] to (Evaluate:13May2017); Last Rx:18May2016 Ordered 3. Lexapro 20 MG Oral Tablet;  Therapy: (Recorded:22Jul2015) to Recorded 4. Methadone HCl - 10 MG Oral Tablet;  Therapy: (Recorded:22Jul2015) to Recorded 5. Nasonex 50 MCG/ACT Nasal Suspension;  Therapy: (Recorded:22Jul2015) to Recorded 6. NexIUM 40 MG Oral Capsule Delayed Release;  Therapy: (Recorded:22Jul2015) to Recorded 7. OxyCODONE HCl - 10 MG Oral Tablet; 1-2 every 4 hours as needed for pain;  Therapy: 07Apr2016 to (Last Rx:18May2016) Ordered  Allergies Medication  1. Clindamycin HCl CAPS 2. Vicodin TABS  Family History Problems  1. Family history of colon cancer (Z80.0) : Sibling 2. Family history of congestive heart failure (Z82.49) : Sister 3. Family history of malignant neoplasm (  Z80.9) : Mother 4. Family history of stroke (Z82.3) : Father  Social History Problems  1. Alcohol use (Z78.9) 2. Caffeine use (F15.90) 3. Divorced 4. Never a smoker 5. Occupation  Family and social  history reviewed and are unchanged.        Vitals Vital Signs  Weight: 215 lb  BMI Calculated: 30.85 BSA Calculated: 2.15 Blood Pressure: 133 / 88 Heart Rate: 95   Review of Systems Genitourinary, constitutional, skin, eye, otolaryngeal, hematologic/lymphatic, cardiovascular, pulmonary, endocrine, musculoskeletal, gastrointestinal, neurological and psychiatric system(s) were reviewed and pertinent findings if present are noted.  Genitourinary: dysuria, nocturia, difficulty starting the urinary stream, urinary stream starts and stops, post-void dribbling and erectile dysfunction.  Gastrointestinal: nausea and heartburn.  Constitutional: night sweats, feeling tired (fatigue) and recent weight loss.  Integumentary: skin rash/lesion and pruritus.  Respiratory: shortness of breath and cough.  Endocrine: polydipsia.  Musculoskeletal: back pain and joint pain.  Psychiatric: anxiety and depression.     Physical Exam Constitutional: Well nourished and well developed . No acute distress.  ENT:. The ears and nose are normal in appearance.  Neck: The appearance of the neck is normal and no neck mass is present.  Pulmonary: No respiratory distress and normal respiratory rhythm and effort.  Cardiovascular: Heart rate and rhythm are normal . No peripheral edema.  Abdomen: The abdomen is soft and nontender. No masses are palpated. No CVA tenderness. No hernias are palpable. No hepatosplenomegaly noted.  Genitourinary: Examination of the penis demonstrates no discharge, no masses, no lesions and a normal meatus. The penis is uncircumcised. The scrotum is without lesions. The right epididymis is palpably normal and non-tender. The left epididymis is palpably normal and non-tender. The right testis is non-tender and without masses. The left testis is non-tender and without masses.  Lymphatics: The femoral and inguinal nodes are not enlarged or tender.  Skin: Normal skin turgor, no visible rash and no  visible skin lesions.  Neuro/Psych: Mood and affect are appropriate.     Results/Data Urine  COLOR AMBER  APPEARANCE CLOUDY  SPECIFIC GRAVITY 1.020  pH 5.0  GLUCOSE NEGATIVE  BILIRUBIN NEGATIVE  KETONE NEGATIVE  BLOOD 3+  PROTEIN TRACE  NITRITE NEGATIVE  LEUKOCYTE ESTERASE NEGATIVE  SQUAMOUS EPITHELIAL/HPF 0-5 HPF WBC 0-5 WBC/HPF RBC >60 RBC/HPF BACTERIA MODERATE HPF CRYSTALS NONE SEEN HPF CASTS NONE SEEN LPF Yeast NONE SEEN HPF      Procedure  Procedure: Cystoscopy done on 05/27/15  Indication: Hematuria. History of Urothelial Carcinoma.  Informed Consent: Risks, benefits, and potential adverse events were discussed and informed consent was obtained from the patient.  Prep: The patient was prepped with betadine.  Anesthesia:. Local anesthesia was administered intraurethrally with 2% lidocaine jelly.  Procedure Note:  Urethral meatus:. No abnormalities.  Anterior urethra: No abnormalities.  Prostatic urethra: No abnormalities.  Bladder: Visulization was obscured due to cloudy urine. The ureteral orifices were in the normal anatomic position bilaterally and had clear efflux of urine. Multiple tumors were identified in the bladder. A papillary tumor was seen in the bladder. This tumor was located on the right side, on the anterior aspect of the bladder. The patient tolerated the procedure well.  Complications: None.    Assessment   His gross hematuria is secondary to multiple bladder tumors that have redeveloped within the bladder. I noted he had a papillary tumor on the anterior wall and also what appeared to be papillary tumors involving the right wall of the bladder as well. Visualization was somewhat limited due  to the presence of blood within the bladder but I was able to see enough to have determined the cause of his hematuria.     We discussed proceeding with evaluation using transurethral resection of the lesion. I have discussed the procedure in detail as well as  the potential risks and complications associated with this form of surgery. We also discussed the probability of successful resection of the intravesical portion of this lesion. I have recommended, as long as there is no contraindication at the time of surgery, the placement of intravesical mitomycin-C in order to reduce the risk of recurrence. We did discuss the potential side effects of this form of intravesical chemotherapy. In addition he has not had any upper tract evaluations so I will perform bilateral retrograde pyelograms. The procedure will be performed under anesthesia as an outpatient.   Plan   1. His preoperative urine culture was found to be negative.  2. He will be scheduled for TUR BT and bilateral retrograde pyelograms with postoperative mitomycin C.

## 2015-06-06 NOTE — Progress Notes (Signed)
Fentanyl waste was charted on wrong time for the dose given in am.  Fentanyl 66mcg was given at 1300 and 50 mcg was wasted in black bin.  Unable to chart waste at 1300 because of the error.

## 2015-06-06 NOTE — Discharge Instructions (Signed)
Transurethral Resection of Bladder Tumor (TURBT)   Definition:  Transurethral Resection of the Bladder Tumor is a surgical procedure used to diagnose and remove tumors within the bladder. TURBT is the most common treatment for early stage bladder cancer.  General instructions:     Your recent bladder surgery requires very little post hospital care but some definite precautions.  Despite the fact that no skin incisions were used, the area around the bladder incisions are raw and covered with scabs to promote healing and prevent bleeding. Certain precautions are needed to insure that the scabs are not disturbed over the next 2-4 weeks while the healing proceeds.  Because the raw surface inside your bladder and the irritating effects of urine you may expect frequency of urination and/or urgency (a stronger desire to urinate) and perhaps even getting up at night more often. This will usually resolve or improve slowly over the healing period. You may see some blood in your urine over the first 6 weeks. Do not be alarmed, even if the urine was clear for a while. Get off your feet and drink lots of fluids until clearing occurs. If you start to pass clots or don't improve call us.  Catheter: (If you are discharged with a catheter.)  1. Keep your catheter secured to your leg at all times with tape or the supplied strap. 2. You may experience leakage of urine around your catheter- as long as the  catheter continues to drain, this is normal.  If your catheter stops draining  go to the ER. 3. You may also have blood in your urine, even after it has been clear for  several days; you may even pass some small blood clots or other material.  This  is normal as well.  If this happens, sit down and drink plenty of water to help  make urine to flush out your bladder.  If the blood in your urine becomes worse  after doing this, contact our office or return to the ER. 4. You may use the leg bag (small bag)  during the day, but use the large bag at  night.  Diet:  You may return to your normal diet immediately. Because of the raw surface of your bladder, alcohol, spicy foods, foods high in acid and drinks with caffeine may cause irritation or frequency and should be used in moderation. To keep your urine flowing freely and avoid constipation, drink plenty of fluids during the day (8-10 glasses). Tip: Avoid cranberry juice because it is very acidic.  Activity:  Your physical activity doesn't need to be restricted. However, if you are very active, you may see some blood in the urine. We suggest that you reduce your activity under the circumstances until the bleeding has stopped.  Bowels:  It is important to keep your bowels regular during the postoperative period. Straining with bowel movements can cause bleeding. A bowel movement every other day is reasonable. Use a mild laxative if needed, such as milk of magnesia 2-3 tablespoons, or 2 Dulcolax tablets. Call if you continue to have problems. If you had been taking narcotics for pain, before, during or after your surgery, you may be constipated. Take a laxative if necessary.    Medication:  You should resume your pre-surgery medications unless told not to. In addition you may be given an antibiotic to prevent or treat infection. Antibiotics are not always necessary. All medication should be taken as prescribed until the bottles are finished unless you are having  an unusual reaction to one of the drugs.    Post Anesthesia Home Care Instructions  Activity: Get plenty of rest for the remainder of the day. A responsible adult should stay with you for 24 hours following the procedure.  For the next 24 hours, DO NOT: -Drive a car -Operate machinery -Drink alcoholic beverages -Take any medication unless instructed by your physician -Make any legal decisions or sign important papers.  Meals: Start with liquid foods such as gelatin or soup.  Progress to regular foods as tolerated. Avoid greasy, spicy, heavy foods. If nausea and/or vomiting occur, drink only clear liquids until the nausea and/or vomiting subsides. Call your physician if vomiting continues.  Special Instructions/Symptoms: Your throat may feel dry or sore from the anesthesia or the breathing tube placed in your throat during surgery. If this causes discomfort, gargle with warm salt water. The discomfort should disappear within 24 hours.  If you had a scopolamine patch placed behind your ear for the management of post- operative nausea and/or vomiting:  1. The medication in the patch is effective for 72 hours, after which it should be removed.  Wrap patch in a tissue and discard in the trash. Wash hands thoroughly with soap and water. 2. You may remove the patch earlier than 72 hours if you experience unpleasant side effects which may include dry mouth, dizziness or visual disturbances. 3. Avoid touching the patch. Wash your hands with soap and water after contact with the patch.    

## 2015-06-06 NOTE — Anesthesia Postprocedure Evaluation (Signed)
Anesthesia Post Note  Patient: Todd [name].  Procedure(s) Performed: Procedure(s) (LRB): TRANSURETHRAL RESECTION OF BLADDER TUMOR WITH GYRUS (TURBT-GYRUS) (N/A) BILATERAL  RETROGRADE PYELOGRAM (Bilateral)  Patient location during evaluation: PACU Anesthesia Type: General Level of consciousness: sedated Pain management: satisfactory to patient Vital Signs Assessment: post-procedure vital signs reviewed and stable Respiratory status: spontaneous breathing Cardiovascular status: stable Anesthetic complications: no    Last Vitals:  Filed Vitals:   06/06/15 1245 06/06/15 1300  BP: 138/71 130/93  Pulse: 64 65  Temp:    Resp: 9 17    Last Pain: There were no vitals filed for this visit.               Riccardo Dubin

## 2015-06-09 ENCOUNTER — Encounter (HOSPITAL_BASED_OUTPATIENT_CLINIC_OR_DEPARTMENT_OTHER): Payer: Self-pay | Admitting: Urology

## 2015-06-16 DIAGNOSIS — Z0000 Encounter for general adult medical examination without abnormal findings: Secondary | ICD-10-CM | POA: Diagnosis not present

## 2015-06-16 DIAGNOSIS — C672 Malignant neoplasm of lateral wall of bladder: Secondary | ICD-10-CM | POA: Diagnosis not present

## 2015-06-16 DIAGNOSIS — R3989 Other symptoms and signs involving the genitourinary system: Secondary | ICD-10-CM | POA: Diagnosis not present

## 2015-06-16 MED FILL — oxyCODONE HCL 10 MG TABS: 10 | 7 days supply | Qty: 30 | Fill #0

## 2015-06-20 ENCOUNTER — Telehealth: Payer: Self-pay | Admitting: *Deleted

## 2015-06-20 NOTE — Telephone Encounter (Signed)
Received office note from urologist. Placed on MD desk for review. Left message on voicemail requesting pt call office. Is he following up with a medical oncologist?

## 2015-06-24 DIAGNOSIS — C689 Malignant neoplasm of urinary organ, unspecified: Secondary | ICD-10-CM | POA: Diagnosis not present

## 2015-06-26 DIAGNOSIS — R9341 Abnormal radiologic findings on diagnostic imaging of renal pelvis, ureter, or bladder: Secondary | ICD-10-CM | POA: Diagnosis not present

## 2015-06-26 DIAGNOSIS — N3289 Other specified disorders of bladder: Secondary | ICD-10-CM | POA: Diagnosis not present

## 2015-06-26 DIAGNOSIS — K449 Diaphragmatic hernia without obstruction or gangrene: Secondary | ICD-10-CM | POA: Diagnosis not present

## 2015-06-26 DIAGNOSIS — K838 Other specified diseases of biliary tract: Secondary | ICD-10-CM | POA: Diagnosis not present

## 2015-06-30 DIAGNOSIS — R300 Dysuria: Secondary | ICD-10-CM | POA: Diagnosis not present

## 2015-06-30 DIAGNOSIS — C679 Malignant neoplasm of bladder, unspecified: Secondary | ICD-10-CM | POA: Diagnosis not present

## 2015-07-16 DIAGNOSIS — Z0000 Encounter for general adult medical examination without abnormal findings: Secondary | ICD-10-CM | POA: Diagnosis not present

## 2015-07-16 DIAGNOSIS — N390 Urinary tract infection, site not specified: Secondary | ICD-10-CM | POA: Diagnosis not present

## 2015-07-16 DIAGNOSIS — R3989 Other symptoms and signs involving the genitourinary system: Secondary | ICD-10-CM | POA: Diagnosis not present

## 2015-07-21 DIAGNOSIS — R300 Dysuria: Secondary | ICD-10-CM | POA: Diagnosis not present

## 2015-07-21 DIAGNOSIS — D090 Carcinoma in situ of bladder: Secondary | ICD-10-CM | POA: Diagnosis not present

## 2015-07-21 DIAGNOSIS — K6289 Other specified diseases of anus and rectum: Secondary | ICD-10-CM | POA: Diagnosis not present

## 2015-07-21 DIAGNOSIS — C679 Malignant neoplasm of bladder, unspecified: Secondary | ICD-10-CM | POA: Diagnosis not present

## 2015-07-21 DIAGNOSIS — R319 Hematuria, unspecified: Secondary | ICD-10-CM | POA: Diagnosis not present

## 2015-07-21 DIAGNOSIS — C672 Malignant neoplasm of lateral wall of bladder: Secondary | ICD-10-CM | POA: Diagnosis not present

## 2015-07-21 DIAGNOSIS — R52 Pain, unspecified: Secondary | ICD-10-CM | POA: Diagnosis not present

## 2015-07-21 DIAGNOSIS — C671 Malignant neoplasm of dome of bladder: Secondary | ICD-10-CM | POA: Diagnosis not present

## 2015-07-21 DIAGNOSIS — R102 Pelvic and perineal pain: Secondary | ICD-10-CM | POA: Diagnosis not present

## 2015-08-22 DIAGNOSIS — M545 Low back pain: Secondary | ICD-10-CM | POA: Diagnosis not present

## 2015-08-22 DIAGNOSIS — K219 Gastro-esophageal reflux disease without esophagitis: Secondary | ICD-10-CM | POA: Diagnosis not present

## 2015-08-22 DIAGNOSIS — F17200 Nicotine dependence, unspecified, uncomplicated: Secondary | ICD-10-CM | POA: Diagnosis not present

## 2015-08-22 DIAGNOSIS — F329 Major depressive disorder, single episode, unspecified: Secondary | ICD-10-CM | POA: Diagnosis not present

## 2015-09-01 DIAGNOSIS — R399 Unspecified symptoms and signs involving the genitourinary system: Secondary | ICD-10-CM | POA: Diagnosis not present

## 2015-09-01 DIAGNOSIS — Z716 Tobacco abuse counseling: Secondary | ICD-10-CM | POA: Diagnosis not present

## 2015-09-22 DIAGNOSIS — K219 Gastro-esophageal reflux disease without esophagitis: Secondary | ICD-10-CM | POA: Diagnosis not present

## 2015-09-22 DIAGNOSIS — M545 Low back pain: Secondary | ICD-10-CM | POA: Diagnosis not present

## 2015-09-22 DIAGNOSIS — F17200 Nicotine dependence, unspecified, uncomplicated: Secondary | ICD-10-CM | POA: Diagnosis not present

## 2015-09-22 DIAGNOSIS — F329 Major depressive disorder, single episode, unspecified: Secondary | ICD-10-CM | POA: Diagnosis not present

## 2015-10-22 DIAGNOSIS — F17200 Nicotine dependence, unspecified, uncomplicated: Secondary | ICD-10-CM | POA: Diagnosis not present

## 2015-10-22 DIAGNOSIS — K219 Gastro-esophageal reflux disease without esophagitis: Secondary | ICD-10-CM | POA: Diagnosis not present

## 2015-10-22 DIAGNOSIS — F329 Major depressive disorder, single episode, unspecified: Secondary | ICD-10-CM | POA: Diagnosis not present

## 2015-10-22 DIAGNOSIS — M545 Low back pain: Secondary | ICD-10-CM | POA: Diagnosis not present

## 2015-11-21 DIAGNOSIS — M545 Low back pain: Secondary | ICD-10-CM | POA: Diagnosis not present

## 2015-11-21 DIAGNOSIS — F17200 Nicotine dependence, unspecified, uncomplicated: Secondary | ICD-10-CM | POA: Diagnosis not present

## 2015-11-21 DIAGNOSIS — K219 Gastro-esophageal reflux disease without esophagitis: Secondary | ICD-10-CM | POA: Diagnosis not present

## 2015-11-21 DIAGNOSIS — F329 Major depressive disorder, single episode, unspecified: Secondary | ICD-10-CM | POA: Diagnosis not present

## 2015-11-22 ENCOUNTER — Other Ambulatory Visit: Payer: Self-pay | Admitting: Gastroenterology

## 2015-12-17 DIAGNOSIS — C679 Malignant neoplasm of bladder, unspecified: Secondary | ICD-10-CM | POA: Diagnosis not present

## 2015-12-23 DIAGNOSIS — K219 Gastro-esophageal reflux disease without esophagitis: Secondary | ICD-10-CM | POA: Diagnosis not present

## 2015-12-23 DIAGNOSIS — F17200 Nicotine dependence, unspecified, uncomplicated: Secondary | ICD-10-CM | POA: Diagnosis not present

## 2015-12-23 DIAGNOSIS — F329 Major depressive disorder, single episode, unspecified: Secondary | ICD-10-CM | POA: Diagnosis not present

## 2015-12-23 DIAGNOSIS — M545 Low back pain: Secondary | ICD-10-CM | POA: Diagnosis not present

## 2016-01-09 ENCOUNTER — Encounter: Payer: Self-pay | Admitting: Gastroenterology

## 2016-01-22 DIAGNOSIS — K219 Gastro-esophageal reflux disease without esophagitis: Secondary | ICD-10-CM | POA: Diagnosis not present

## 2016-01-22 DIAGNOSIS — F329 Major depressive disorder, single episode, unspecified: Secondary | ICD-10-CM | POA: Diagnosis not present

## 2016-01-22 DIAGNOSIS — F17200 Nicotine dependence, unspecified, uncomplicated: Secondary | ICD-10-CM | POA: Diagnosis not present

## 2016-01-22 DIAGNOSIS — M545 Low back pain: Secondary | ICD-10-CM | POA: Diagnosis not present

## 2016-03-15 ENCOUNTER — Ambulatory Visit: Payer: Self-pay | Admitting: Gastroenterology

## 2016-03-15 ENCOUNTER — Encounter: Payer: Self-pay | Admitting: Gastroenterology

## 2016-03-15 ENCOUNTER — Ambulatory Visit (INDEPENDENT_AMBULATORY_CARE_PROVIDER_SITE_OTHER): Payer: Medicare PPO | Admitting: Gastroenterology

## 2016-03-15 VITALS — BP 138/84 | HR 88 | Ht 70.0 in | Wt 233.0 lb

## 2016-03-15 DIAGNOSIS — Z1509 Genetic susceptibility to other malignant neoplasm: Secondary | ICD-10-CM

## 2016-03-15 DIAGNOSIS — Z85038 Personal history of other malignant neoplasm of large intestine: Secondary | ICD-10-CM

## 2016-03-15 DIAGNOSIS — R112 Nausea with vomiting, unspecified: Secondary | ICD-10-CM

## 2016-03-15 DIAGNOSIS — R1013 Epigastric pain: Secondary | ICD-10-CM | POA: Diagnosis not present

## 2016-03-15 DIAGNOSIS — G894 Chronic pain syndrome: Secondary | ICD-10-CM

## 2016-03-15 DIAGNOSIS — R1012 Left upper quadrant pain: Secondary | ICD-10-CM | POA: Diagnosis not present

## 2016-03-15 DIAGNOSIS — Z860100 Personal history of colon polyps, unspecified: Secondary | ICD-10-CM | POA: Insufficient documentation

## 2016-03-15 DIAGNOSIS — R110 Nausea: Secondary | ICD-10-CM | POA: Insufficient documentation

## 2016-03-15 MED ORDER — PANTOPRAZOLE SODIUM 40 MG PO TBEC: 40.0000 mg | DELAYED_RELEASE_TABLET | Freq: Two times a day (BID) | ORAL | 5 refills | Status: DC

## 2016-03-15 MED ORDER — NA SULFATE-K SULFATE-MG SULF 17.5-3.13-1.6 GM/177ML PO SOLN: 1.0000 | Freq: Once | ORAL | 0 refills | Status: AC

## 2016-03-15 MED FILL — SUPREP BOWEL PREP KIT: 17.5-3.13-1 | 1 days supply | Qty: 354 | Fill #0

## 2016-03-15 NOTE — Progress Notes (Signed)
03/15/2016 Cleda Mccreedy. [id number] 09/07/1955  CC:  Upper abdominal pain and nausea/vomiting  History of Present Illness:  This is a 60 year old male who is previously known to Dr. Havery Moros.  He has Lynch syndrome and history of colon cancer in 2013.  Last colonoscopy by Dr. Havery Moros in 03/2015 at which time he was found to have several polyps that were removed, some benign mucosa and other tubular adenomas.  Repeat colonoscopy recommended in one year from that time.  He also had an EGD in 03/2015 as well for complaints of LUQ abdominal pain.  That showed the following:  ENDOSCOPIC IMPRESSION: LA grade A esophagitis 5cm hiatal hernia Normal stomach, biopsies taken to rule out H pylori 3 suspected duodenal adenomas removed, the largest via hot snare One suspected prominent fold in the duodenum, biopsies taken to ensure no adenomatous change.  Pathology showed mild chronic gastritis without Hpylori and polypoid peptic duodenitis.  Repeat EGD was recommended in 2-3 years for surveillance.  He comes in today to schedule colonoscopy and also to discuss repeat EGD as well for complaints of nausea/vomiting and upper abdominal pain.  He tells me that he's had these symptoms for about the past 2 months.  Tells me that he just feels miserable all the time.  He is taking his pantoprazole 40 mg daily.  He says that he has been using Naproxen frequently recently but started that after his symptoms began because he has been using it to try to treat his upper abdominal pain.  He tells me that he is off of his pain medication (but then after his visit when reviewing his chart it appears that he just got methadone in November).    Current Medications, Allergies, Past Medical History, Past Surgical History, Family History and Social History were reviewed in [employer] record.   Physical Exam: BP 138/84   Pulse 88   Ht 5\' 10"  (1.778 m)   Wt 233 lb (105.7 kg)   BMI 33.43  kg/m  General: Well developed black male in no acute distress Head: Normocephalic and atraumatic Eyes:  Sclerae anicteric, conjunctiva pink  Ears: Normal auditory acuity Lungs: Clear throughout to auscultation Heart: Regular rate and rhythm Abdomen: Soft, non-distended.  Normal bowel sounds.  Epigastric and LUQ TTP. Rectal:  Will be done at the time of colonoscopy. Musculoskeletal: Symmetrical with no gross deformities  Extremities: No edema  Neurological: Alert oriented x 4, grossly non-focal Psychological:  Alert and cooperative. Normal mood and affect  Assessment and Recommendations: -Lynch syndrome with personal history of colon cancer in 2013 and adenomatous colon polyps:  Last colonoscopy 03/2015 with multiple colon polyps.  Due for colonoscopy recall.  Will schedule with Dr. Havery Moros. -LUQ abdominal pain and nausea/vomiting:  Had complaints of pain in the same area last year.  Will increase pantoprazole to BID.  I have asked him to try to avoid taking NSAID's.  Will schedule for EGD with Dr. Havery Moros as well. -NSAID use:  ? If he has an ulcer that is causing his pain and nausea/vomiting, but he says that he started using the NSAID's to treat the pain, so the symptoms were present prior to starting NSAID's. -Health related anxiety -Chronic pain:  He reports being off of his pain medication but it appears that he got his methadone most recently in November (I did not realize this until after his visit so he was not confronted about it).  *The risks, benefits, and alternatives to EGD  and colonoscopy were discussed with the patient and he consents to proceed.

## 2016-03-15 NOTE — Patient Instructions (Signed)
We have sent the following medications to your pharmacy for you to pick up at your convenience: pantoprazole to take one capsule by mouth twice daily.   You have been scheduled for an endoscopy and colonoscopy. Please follow the written instructions given to you at your visit today. Please pick up your prep supplies at the pharmacy within the next 1-3 days. If you use inhalers (even only as needed), please bring them with you on the day of your procedure. Your physician has requested that you go to www.[email or web address].[email or web address] and enter the access code given to you at your visit today. This web site gives a general overview about your procedure. However, you should still follow specific instructions given to you by our office regarding your preparation for the procedure.

## 2016-03-16 ENCOUNTER — Encounter: Payer: Self-pay | Admitting: Gastroenterology

## 2016-03-19 NOTE — Progress Notes (Signed)
Agree with assessment and plan as outlined.  

## 2016-04-01 IMAGING — MR MR ABDOMEN WO/W CM MRCP
10 of 19 series · 20 of 48 positions shown · IV contrast (multihance)
Comparison: Abdominal pelvic CT 09/24/2013.

CLINICAL DATA: Abdominal pain. History of colon cancer and
cholecystectomy

EXAM:
MRI ABDOMEN WITHOUT AND WITH CONTRAST (INCLUDING MRCP)
TECHNIQUE: Multiplanar multisequence MR imaging of the abdomen was performed
both before and after the administration of intravenous contrast.
Heavily T2-weighted images of the biliary and pancreatic ducts were
obtained, and three-dimensional MRCP images were rendered by post
processing.
CONTRAST:  20mL MULTIHANCE GADOBENATE DIMEGLUMINE 529 MG/ML IV SOLN

[Series 3: T2 fat-sat · axial · 5.0mm · 0.74mm/px · z∈[-39,+181]mm · 2 of 45 slices shown]
[im 1/45]
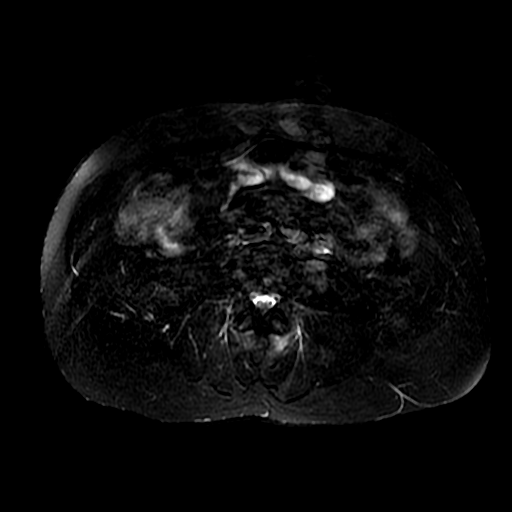
[im 45/45]
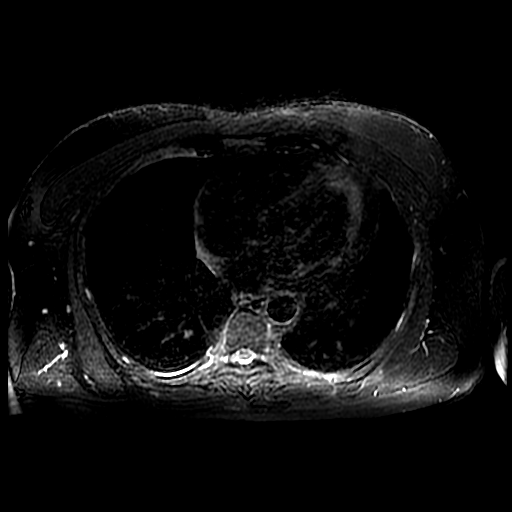

[Series 4: T2 · axial · 5.0mm · 0.74mm/px · z∈[-39,+181]mm · 2 of 45 slices shown (1 of 2)]
[im 1/45]
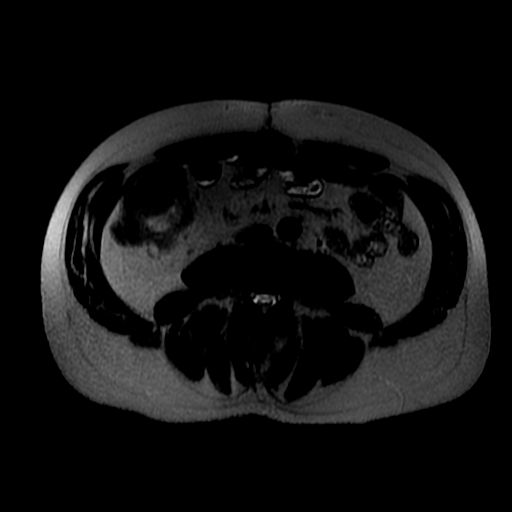
[im 45/45]
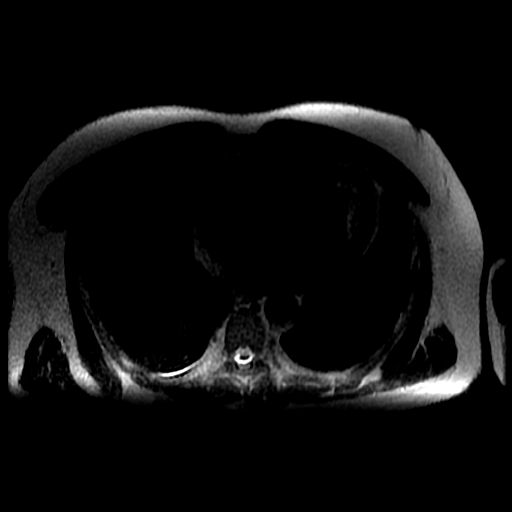

[Series 5: T2 · coronal · 5.0mm · 0.74mm/px · 2 of 40 slices shown (2 of 2)]
[im 1/40]
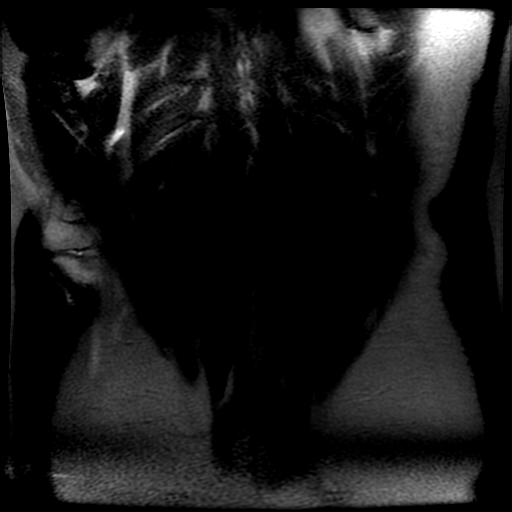
[im 40/40]
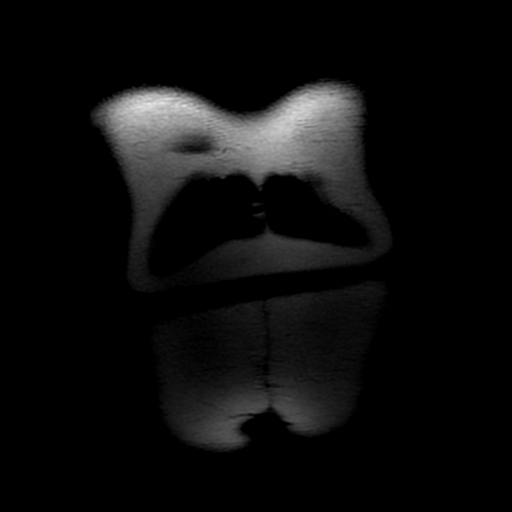

[Series 7: MRCP · coronal · 2.0mm · 0.70mm/px · 1 of 38 slices shown (1 of 2)]
[im 1/38]
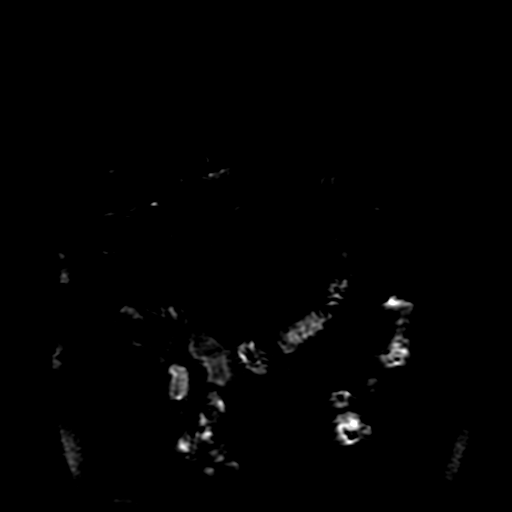

[Series 8: DWI b500 · axial · 6.0mm · 1.48mm/px · z∈[-38,+180]mm · 2 of 58 slices shown]
[im 1/58]
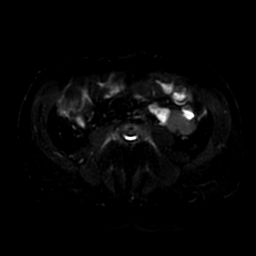
[im 58/58]
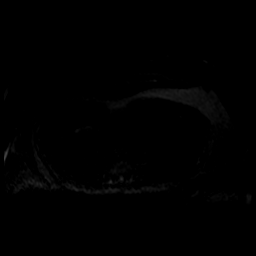

[Series 12: ax dualecho · axial · 5.0mm · 0.74mm/px · z∈[+10,+180]mm · 3 of 70 slices shown]
[im 1/70]
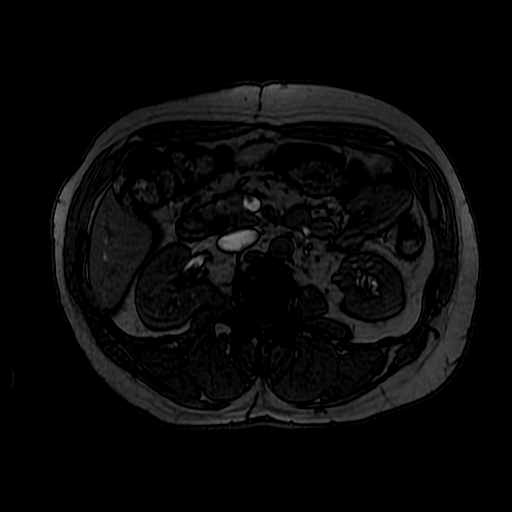
[im 35/70]
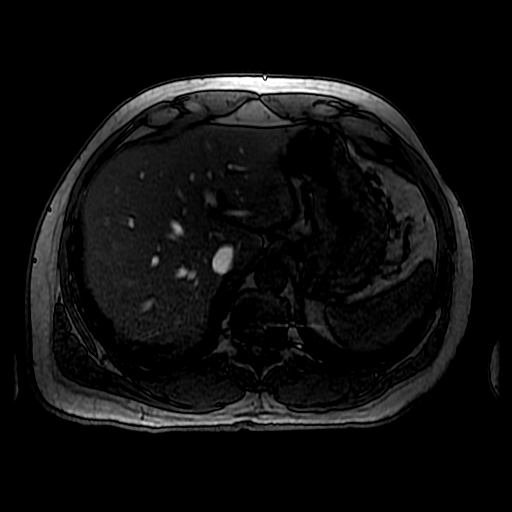
[im 70/70]
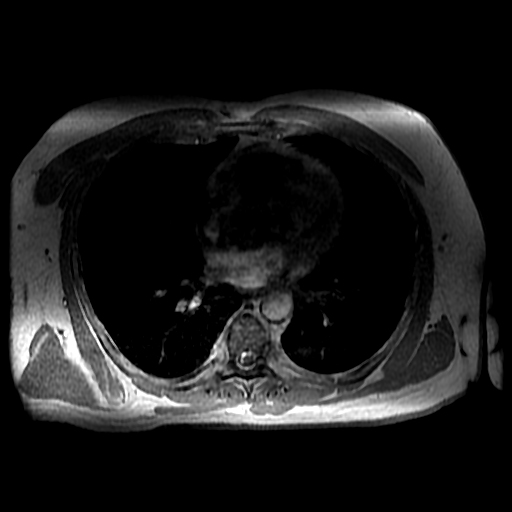

[Series 13: MRCP · coronal · 40.0mm · 0.70mm/px · 1 of 12 slices shown (2 of 2)]
[im 1/12]
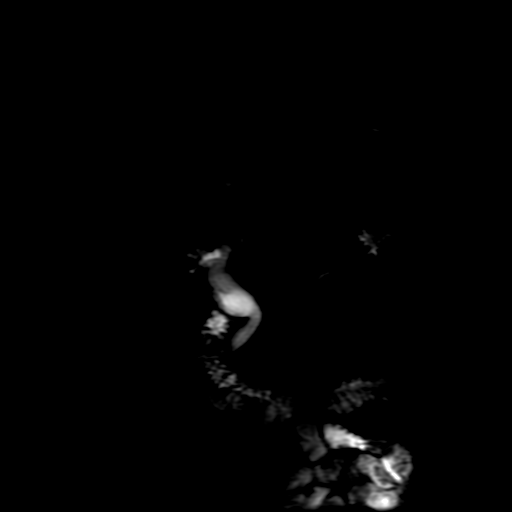

[Series 15: T1 dynamic post-contrast · coronal · 5.0mm · 0.74mm/px · 3 of 88 slices shown]
[im 1/88]
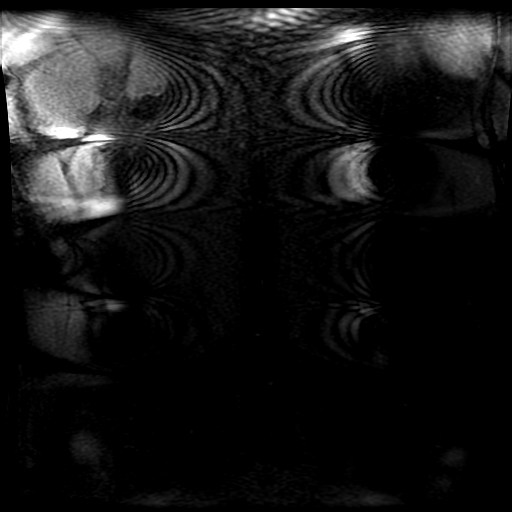
[im 44/88]
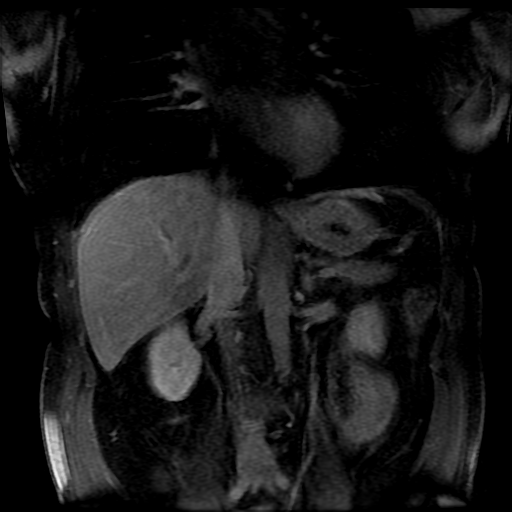
[im 88/88]
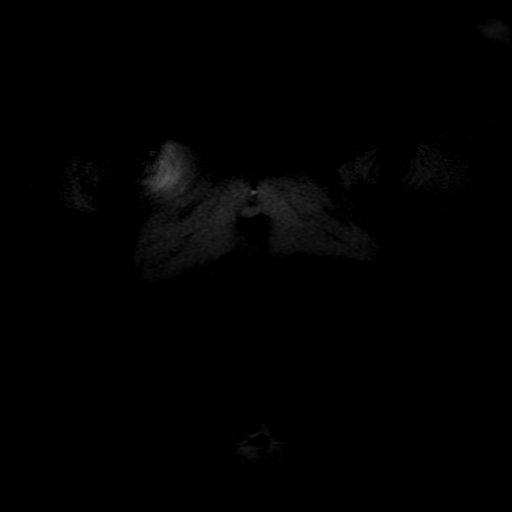

[Series 601: processed images · axial · 1.6mm · 0.62mm/px · 1 of 1 slices shown]
[im 1/1]
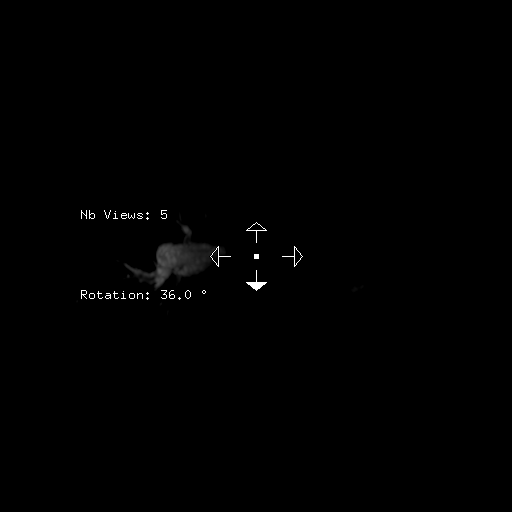

[Series 1400: T1 dynamic · axial · 5.0mm · 0.74mm/px · z∈[-14,+204]mm · 3 of 88 slices shown]
[im 1/88]
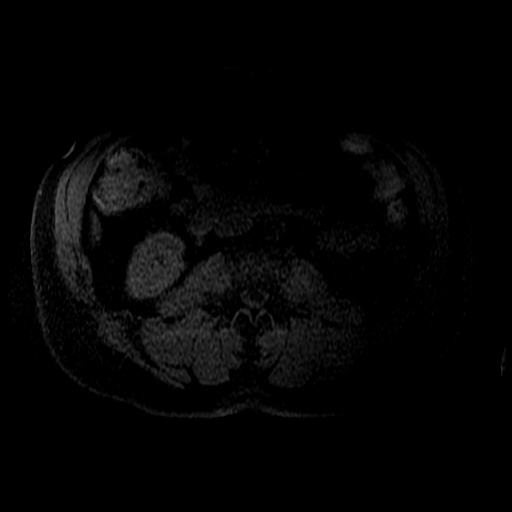
[im 44/88]
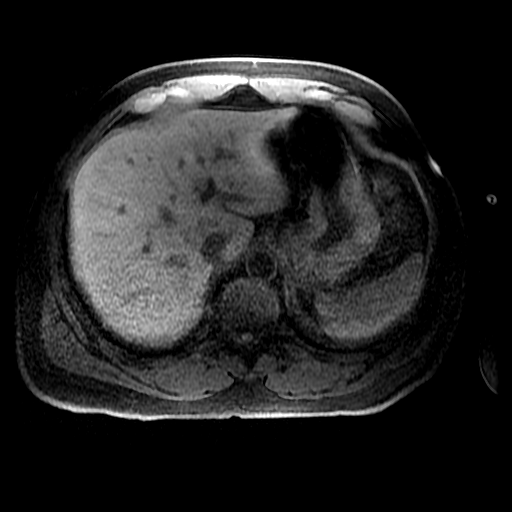
[im 88/88]
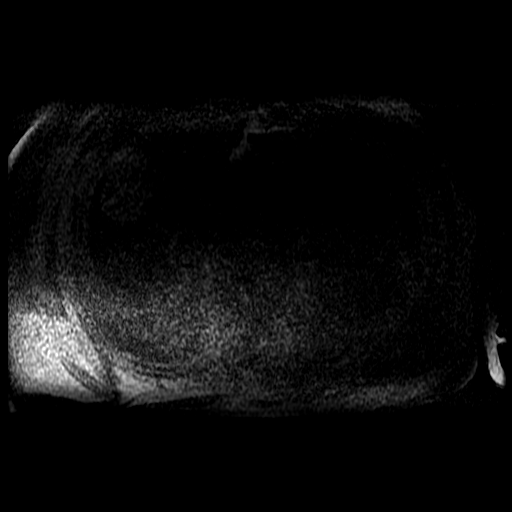

[20 of 48 positions shown; findings below may reference images not displayed]

FINDINGS: The gallbladder is surgically absent. There is minimal intrahepatic
biliary prominence with moderate fusiform dilatation of the common
hepatic duct. The duct measures up to 2.1 cm in diameter. The common
bile duct tapers distally. There is no evidence of
choledocholithiasis.

There is no evidence of pancreatic or ampullary mass. There is no
pancreatic ductal dilatation or evidence of pancreas divisum.

There are tiny cysts within the dome of the right and left hepatic
lobes. The liver otherwise appears normal. The spleen, adrenal
glands and kidneys appear normal. There is no adenopathy or ascites.
IMPRESSION: 1. Fusiform extrahepatic biliary dilatation status post
cholecystectomy. No specific etiology demonstrated. This could
reflect a type 1 choledochal cyst.
2. The pancreas and pancreatic duct appear normal.
3. No acute abdominal findings.

## 2016-05-04 ENCOUNTER — Encounter: Payer: Self-pay | Admitting: Gastroenterology

## 2016-05-04 ENCOUNTER — Ambulatory Visit (AMBULATORY_SURGERY_CENTER): Payer: Medicare PPO | Admitting: Gastroenterology

## 2016-05-04 VITALS — BP 125/71 | HR 77 | Temp 98.0°F | Resp 14 | Ht 70.0 in | Wt 233.0 lb

## 2016-05-04 DIAGNOSIS — Z85038 Personal history of other malignant neoplasm of large intestine: Secondary | ICD-10-CM | POA: Diagnosis not present

## 2016-05-04 DIAGNOSIS — Z1509 Genetic susceptibility to other malignant neoplasm: Secondary | ICD-10-CM

## 2016-05-04 DIAGNOSIS — D122 Benign neoplasm of ascending colon: Secondary | ICD-10-CM

## 2016-05-04 DIAGNOSIS — R1013 Epigastric pain: Secondary | ICD-10-CM | POA: Diagnosis not present

## 2016-05-04 DIAGNOSIS — K2950 Unspecified chronic gastritis without bleeding: Secondary | ICD-10-CM | POA: Diagnosis not present

## 2016-05-04 DIAGNOSIS — K3189 Other diseases of stomach and duodenum: Secondary | ICD-10-CM | POA: Diagnosis not present

## 2016-05-04 DIAGNOSIS — K635 Polyp of colon: Secondary | ICD-10-CM

## 2016-05-04 MED ORDER — SODIUM CHLORIDE 0.9 % IV SOLN: 500.0000 mL | INTRAVENOUS | Status: DC

## 2016-05-04 NOTE — Op Note (Signed)
[place] Patient Name: Todd [name] Procedure Date: 05/04/2016 1:59 PM MRN: [id number] Endoscopist: Remo Lipps P. Seanmichael Salmons MD, MD Age: 61 Referring MD:  Date of Birth: 06-May-1955 Gender: Male Account #: [id number] Procedure:                Upper GI endoscopy Indications:              Epigastric abdominal pain, Hereditary nonpolyposis                            colorectal cancer (Lynch Syndrome) Medicines:                Monitored Anesthesia Care Procedure:                Pre-Anesthesia Assessment:                           - Prior to the procedure, a History and Physical                            was performed, and patient medications and                            allergies were reviewed. The patient's tolerance of                            previous anesthesia was also reviewed. The risks                            and benefits of the procedure and the sedation                            options and risks were discussed with the patient.                            All questions were answered, and informed consent                            was obtained. Prior Anticoagulants: The patient has                            taken no previous anticoagulant or antiplatelet                            agents. ASA Grade Assessment: III - A patient with                            severe systemic disease. After reviewing the risks                            and benefits, the patient was deemed in                            satisfactory condition to undergo the procedure.  After obtaining informed consent, the endoscope was                            passed under direct vision. Throughout the                            procedure, the patient's blood pressure, pulse, and                            oxygen saturations were monitored continuously. The                            Model GIF-HQ190 [id number]) scope was introduced                            through the  mouth, and advanced to the second part                            of duodenum. The upper GI endoscopy was                            accomplished without difficulty. The patient                            tolerated the procedure well. Scope In: Scope Out: Findings:                 Esophagogastric landmarks were identified: the                            Z-line was found at 37 cm, the gastroesophageal                            junction was found at 37 cm and the upper extent of                            the gastric folds was found at 40 cm from the                            incisors.                           A 3 cm hiatal hernia was present.                           A benign small gastric inlet patch was noted in the                            proximal esophagus. The exam of the esophagus was                            otherwise normal.                           The entire examined  stomach was normal. Biopsies                            were taken with a cold forceps for Helicobacter                            pylori testing.                           A suspected benign prominent fold was found in the                            second portion of the duodenum. Biopsies were taken                            with a cold forceps for histology to ensure no                            adenomatous change.                           A single 4 mm sessile polyp verus brunner's gland                            was found in the duodenal bulb. The polyp was                            removed with a cold biopsy forceps. Resection and                            retrieval were complete.                           The exam of the duodenum was otherwise normal. Complications:            No immediate complications. Estimated blood loss:                            Minimal. Estimated Blood Loss:     Estimated blood loss was minimal. Impression:               - Esophagogastric landmarks identified.                            - 3 cm hiatal hernia.                           - Benign gastric inlet patch                           - Normal stomach. Biopsied to rule out H pylori.                           - Prominent fold found in the duodenum. Biopsied.                           -  A single duodenal polyp vs. brunner gland.                            Resected and retrieved. Recommendation:           - Patient has a contact number available for                            emergencies. The signs and symptoms of potential                            delayed complications were discussed with the                            patient. Return to normal activities tomorrow.                            Written discharge instructions were provided to the                            patient.                           - Resume previous diet.                           - Continue present medications.                           - Await pathology results with further                            recommendations Remo Lipps P. Phillippa Straub MD, MD 05/04/2016 3:26:18 PM This report has been signed electronically.

## 2016-05-04 NOTE — Progress Notes (Signed)
Called to room to assist during endoscopic procedure.  Patient ID and intended procedure confirmed with present staff. Received instructions for my participation in the procedure from the performing physician.  

## 2016-05-04 NOTE — Progress Notes (Signed)
Spontaneous respirations throughout. VSS. Resting comfortably. To PACU on room air. Report to  Celia RN. 

## 2016-05-04 NOTE — Progress Notes (Signed)
Unable to document smoking in proper place.  Someone is in the chart, and I cannot access.   Patient still smokes and is unwilling to quit at this time.Seldom alcohol. No drugs.

## 2016-05-04 NOTE — Patient Instructions (Signed)
Discharge instructions given. Handouts on polyps,diverticulosis,hemorrhoids and a hiatal hernia. Resume previous medications. YOU HAD AN ENDOSCOPIC PROCEDURE TODAY AT THE [facility]:   Refer to the procedure report that was given to you for any specific questions about what was found during the examination.  If the procedure report does not answer your questions, please call your gastroenterologist to clarify.  If you requested that your care partner not be given the details of your procedure findings, then the procedure report has been included in a sealed envelope for you to review at your convenience later.  YOU SHOULD EXPECT: Some feelings of bloating in the abdomen. Passage of more gas than usual.  Walking can help get rid of the air that was put into your GI tract during the procedure and reduce the bloating. If you had a lower endoscopy (such as a colonoscopy or flexible sigmoidoscopy) you may notice spotting of blood in your stool or on the toilet paper. If you underwent a bowel prep for your procedure, you may not have a normal bowel movement for a few days.  Please Note:  You might notice some irritation and congestion in your nose or some drainage.  This is from the oxygen used during your procedure.  There is no need for concern and it should clear up in a day or so.  SYMPTOMS TO REPORT IMMEDIATELY:   Following lower endoscopy (colonoscopy or flexible sigmoidoscopy):  Excessive amounts of blood in the stool  Significant tenderness or worsening of abdominal pains  Swelling of the abdomen that is new, acute  Fever of 100F or higher   Following upper endoscopy (EGD)  Vomiting of blood or coffee ground material  New chest pain or pain under the shoulder blades  Painful or persistently difficult swallowing  New shortness of breath  Fever of 100F or higher  Black, tarry-looking stools  For urgent or emergent issues, a gastroenterologist can be reached at any hour by  calling [phone].   DIET:  We do recommend a small meal at first, but then you may proceed to your regular diet.  Drink plenty of fluids but you should avoid alcoholic beverages for 24 hours.  ACTIVITY:  You should plan to take it easy for the rest of today and you should NOT DRIVE or use heavy machinery until tomorrow (because of the sedation medicines used during the test).    FOLLOW UP: Our staff will call the number listed on your records the next business day following your procedure to check on you and address any questions or concerns that you may have regarding the information given to you following your procedure. If we do not reach you, we will leave a message.  However, if you are feeling well and you are not experiencing any problems, there is no need to return our call.  We will assume that you have returned to your regular daily activities without incident.  If any biopsies were taken you will be contacted by phone or by letter within the next 1-3 weeks.  Please call us at [phone] if you have not heard about the biopsies in 3 weeks.    SIGNATURES/CONFIDENTIALITY: You and/or your care partner have signed paperwork which will be entered into your electronic medical record.  These signatures attest to the fact that that the information above on your After Visit Summary has been reviewed and is understood.  Full responsibility of the confidentiality of this discharge information lies with you and/or your   care-partner.

## 2016-05-04 NOTE — Op Note (Signed)
[place] Patient Name: Todd [name] Procedure Date: 05/04/2016 1:59 PM MRN: [id number] Endoscopist: Remo Lipps P. Mileydi Milsap MD, MD Age: 61 Referring MD:  Date of Birth: June 02, 1955 Gender: Male Account #: [id number] Procedure:                Colonoscopy Indications:              High risk colon cancer surveillance: Personal                            history of colon cancer, High risk colon cancer                            surveillance: Personal history of hereditary                            nonpolyposis colorectal cancer (Lynch Syndrome) Medicines:                Monitored Anesthesia Care Procedure:                Pre-Anesthesia Assessment:                           - Prior to the procedure, a History and Physical                            was performed, and patient medications and                            allergies were reviewed. The patient's tolerance of                            previous anesthesia was also reviewed. The risks                            and benefits of the procedure and the sedation                            options and risks were discussed with the patient.                            All questions were answered, and informed consent                            was obtained. Prior Anticoagulants: The patient has                            taken no previous anticoagulant or antiplatelet                            agents. ASA Grade Assessment: III - A patient with                            severe systemic disease. After reviewing the risks  and benefits, the patient was deemed in                            satisfactory condition to undergo the procedure.                           After obtaining informed consent, the colonoscope                            was passed under direct vision. Throughout the                            procedure, the patient's blood pressure, pulse, and                            oxygen saturations  were monitored continuously. The                            Model CF-HQ190L [id number]) scope was introduced                            through the anus and advanced to the the terminal                            ileum, with identification of the appendiceal                            orifice and IC valve. The colonoscopy was performed                            without difficulty. The patient tolerated the                            procedure well. The quality of the bowel                            preparation was adequate. The terminal ileum,                            ileocecal valve, appendiceal orifice, and rectum                            were photographed. Scope In: 2:50:39 PM Scope Out: 3:12:03 PM Scope Withdrawal Time: 0 hours 18 minutes 25 seconds  Total Procedure Duration: 0 hours 21 minutes 24 seconds  Findings:                 The perianal and digital rectal examinations were                            normal.                           A 5 mm polyp was found in the ascending colon. The  polyp was sessile. The polyp was removed with a                            cold snare. Resection and retrieval were complete.                           Scattered small-mouthed diverticula were found in                            the entire colon.                           There was evidence of a prior end-to-end                            colo-colonic anastomosis in the transverse colon.                            This was patent and was characterized by healthy                            appearing mucosa.                           Internal hemorrhoids were found during retroflexion.                           The terminal ileum appeared normal.                           The exam was otherwise without abnormality. Of                            note, the patient did not retain air well in the                            left colon, with significant spasm, which  prolonged                            this procedure. Complications:            No immediate complications. Estimated blood loss:                            Minimal. Estimated Blood Loss:     Estimated blood loss was minimal. Impression:               - One 5 mm polyp in the ascending colon, removed                            with a cold snare. Resected and retrieved.                           - Diverticulosis in the entire examined colon.                           -  Patent end-to-end colo-colonic anastomosis,                            characterized by healthy appearing mucosa.                           - Internal hemorrhoids.                           - The examined portion of the ileum was normal.                           - The examination was otherwise normal. Recommendation:           - Patient has a contact number available for                            emergencies. The signs and symptoms of potential                            delayed complications were discussed with the                            patient. Return to normal activities tomorrow.                            Written discharge instructions were provided to the                            patient.                           - Resume previous diet.                           - Continue present medications.                           - Await pathology results.                           - Repeat colonoscopy is recommended for                            surveillance in one year Carlota Raspberry. Darianne Muralles MD, MD 05/04/2016 3:19:38 PM This report has been signed electronically.

## 2016-05-05 ENCOUNTER — Telehealth: Payer: Self-pay

## 2016-05-05 NOTE — Telephone Encounter (Signed)
No number or name identifier. Unable to leave a voicemail due to voicemail is full. Will call back later today.

## 2016-05-05 NOTE — Telephone Encounter (Signed)
  Follow up Call-  Call back number 05/04/2016 04/04/2015 12/03/2013  Post procedure Call Back phone  # [phone]  Permission to leave phone message Yes Yes Yes  Some recent data might be hidden     Patient questions:  Do you have a fever, pain , or abdominal swelling? No. Pain Score  0 *  Have you tolerated food without any problems? Yes.    Have you been able to return to your normal activities? Yes.    Do you have any questions about your discharge instructions: Diet   No. Medications  No. Follow up visit  No.  Do you have questions or concerns about your Care? No.  Actions: * If pain score is 4 or above: No action needed, pain <4.

## 2016-05-13 ENCOUNTER — Other Ambulatory Visit: Payer: Self-pay | Admitting: Gastroenterology

## 2016-05-14 ENCOUNTER — Other Ambulatory Visit: Payer: Self-pay

## 2016-05-14 MED ORDER — PANTOPRAZOLE SODIUM 40 MG PO TBEC: 40.0000 mg | DELAYED_RELEASE_TABLET | Freq: Two times a day (BID) | ORAL | 5 refills | Status: DC

## 2017-01-25 ENCOUNTER — Other Ambulatory Visit: Payer: Self-pay | Admitting: Gastroenterology

## 2017-03-08 ENCOUNTER — Telehealth: Payer: Self-pay | Admitting: Gastroenterology

## 2017-03-08 NOTE — Telephone Encounter (Signed)
Spoke to patient, let him know that his colonoscopy is due in January 2019, but EGD not due until 2020. Recalls in the system. Patient's GERD is controlled on PPI, he admits that if he misses a dose then he will have symptoms. Let him know that they will be calling him to schedule colonoscopy.

## 2017-03-08 NOTE — Telephone Encounter (Signed)
Patient states due to previous procedures he thought he needed both colon and endo done at the same time. Recall in system for 2019 and 2020. Please advise.

## 2017-03-14 ENCOUNTER — Encounter: Payer: Self-pay | Admitting: Gastroenterology

## 2017-03-14 NOTE — Telephone Encounter (Signed)
Patient scheduled for recall colon 05/12/2017.

## 2017-03-29 ENCOUNTER — Institutional Professional Consult (permissible substitution): Payer: Self-pay | Admitting: Pulmonary Disease

## 2017-05-03 ENCOUNTER — Telehealth: Payer: Self-pay

## 2017-05-03 NOTE — Telephone Encounter (Signed)
Patient No Showed for PV for the second time. Patient was scheduled for 04/28/17 and 05/03/17. Patient was called to reschedule PV. Mail box was full so I was unable to leave a message. If patient does not call to reschedule his appointment by 5:00 Pm today, a no show letter will be mailed and his colonoscopy that was scheduled for 05/12/17 will be cancelled per [employer] guidelines.   Riki Sheer, LPN  ( PV )

## 2017-05-12 ENCOUNTER — Encounter: Payer: Self-pay | Admitting: Gastroenterology

## 2017-05-27 ENCOUNTER — Telehealth: Payer: Self-pay

## 2017-05-27 NOTE — Telephone Encounter (Signed)
Dr Havery Moros. This pt is scheduled for his colon on 06/10/17. Pt missed his PV today and after talking to him, he is requesting to have an endo/colon done at the same time. Per Julia's note on 03/08/17, pt is not due for his endo until 2020. I explained this to the pt.  Pt is not having any trouble, but feels due to his medical history of Lynch Syndrome and colon cancer he needs both procedures done! Does pt need an OV first to discuss his concerns or is a direct endo/colon ok? Please advise. Thanks,Janis

## 2017-05-30 NOTE — Telephone Encounter (Signed)
Patient No Showed for Pre-Visit today. Patient was called to reschedule his appointment. No answer. Mail box full could not leave a message. If patient does not call today to reschedule his colonoscopy will be cancelled per [employer] guidelines.   A no show letter will be mailed at the end of the business day.   Letzy Gullickson,LPN  ( PV )

## 2017-05-30 NOTE — Telephone Encounter (Signed)
Thanks for the note. He is due for colonoscopy at this time. He had an EGD one year ago without any concerning findings. While he does need EGD every 2-3 years per guidelines, it is not needed as frequently as the colonoscopy. I don't think he needs EGD now, can continue with colonoscopy. I can explain to him further at time of his colonoscopy if needed.

## 2017-06-10 ENCOUNTER — Encounter: Payer: Self-pay | Admitting: Gastroenterology

## 2017-06-19 ENCOUNTER — Other Ambulatory Visit: Payer: Self-pay | Admitting: Gastroenterology

## 2017-06-28 ENCOUNTER — Encounter: Payer: Self-pay | Admitting: Gastroenterology

## 2017-09-15 ENCOUNTER — Other Ambulatory Visit: Payer: Self-pay

## 2017-09-15 ENCOUNTER — Ambulatory Visit (AMBULATORY_SURGERY_CENTER): Payer: Self-pay

## 2017-09-15 VITALS — Ht 70.5 in | Wt 221.6 lb

## 2017-09-15 DIAGNOSIS — Z860100 Personal history of colon polyps, unspecified: Secondary | ICD-10-CM

## 2017-09-15 NOTE — Progress Notes (Signed)
Denies allergies to eggs or soy products. Denies complication of anesthesia or sedation. Denies use of weight loss medication. Denies use of O2.   Emmi instructions declined.  

## 2017-09-16 ENCOUNTER — Encounter: Payer: Self-pay | Admitting: Gastroenterology

## 2017-09-30 ENCOUNTER — Ambulatory Visit (AMBULATORY_SURGERY_CENTER): Payer: Medicare PPO | Admitting: Gastroenterology

## 2017-09-30 ENCOUNTER — Other Ambulatory Visit: Payer: Self-pay

## 2017-09-30 ENCOUNTER — Encounter: Payer: Self-pay | Admitting: Gastroenterology

## 2017-09-30 VITALS — BP 144/81 | HR 78 | Temp 97.8°F | Resp 18 | Ht 70.5 in | Wt 221.0 lb

## 2017-09-30 DIAGNOSIS — K635 Polyp of colon: Secondary | ICD-10-CM

## 2017-09-30 DIAGNOSIS — Z1509 Genetic susceptibility to other malignant neoplasm: Secondary | ICD-10-CM | POA: Diagnosis not present

## 2017-09-30 DIAGNOSIS — D124 Benign neoplasm of descending colon: Secondary | ICD-10-CM

## 2017-09-30 DIAGNOSIS — D126 Benign neoplasm of colon, unspecified: Secondary | ICD-10-CM

## 2017-09-30 DIAGNOSIS — D128 Benign neoplasm of rectum: Secondary | ICD-10-CM

## 2017-09-30 DIAGNOSIS — Z860100 Personal history of colon polyps, unspecified: Secondary | ICD-10-CM

## 2017-09-30 DIAGNOSIS — D127 Benign neoplasm of rectosigmoid junction: Secondary | ICD-10-CM

## 2017-09-30 DIAGNOSIS — K621 Rectal polyp: Secondary | ICD-10-CM

## 2017-09-30 DIAGNOSIS — Z85038 Personal history of other malignant neoplasm of large intestine: Secondary | ICD-10-CM

## 2017-09-30 DIAGNOSIS — D129 Benign neoplasm of anus and anal canal: Secondary | ICD-10-CM

## 2017-09-30 DIAGNOSIS — D123 Benign neoplasm of transverse colon: Secondary | ICD-10-CM

## 2017-09-30 MED ORDER — SODIUM CHLORIDE 0.9 % IV SOLN: 500.0000 mL | Freq: Once | INTRAVENOUS | Status: DC

## 2017-09-30 NOTE — Progress Notes (Signed)
Pt's states no medical or surgical changes since previsit or office visit. 

## 2017-09-30 NOTE — Progress Notes (Signed)
Called to room to assist during endoscopic procedure.  Patient ID and intended procedure confirmed with present staff. Received instructions for my participation in the procedure from the performing physician.  

## 2017-09-30 NOTE — Patient Instructions (Signed)
YOU HAD AN ENDOSCOPIC PROCEDURE TODAY AT THE [facility]:   Refer to the procedure report that was given to you for any specific questions about what was found during the examination.  If the procedure report does not answer your questions, please call your gastroenterologist to clarify.  If you requested that your care partner not be given the details of your procedure findings, then the procedure report has been included in a sealed envelope for you to review at your convenience later.  YOU SHOULD EXPECT: Some feelings of bloating in the abdomen. Passage of more gas than usual.  Walking can help get rid of the air that was put into your GI tract during the procedure and reduce the bloating. If you had a lower endoscopy (such as a colonoscopy or flexible sigmoidoscopy) you may notice spotting of blood in your stool or on the toilet paper. If you underwent a bowel prep for your procedure, you may not have a normal bowel movement for a few days.  Please Note:  You might notice some irritation and congestion in your nose or some drainage.  This is from the oxygen used during your procedure.  There is no need for concern and it should clear up in a day or so.  SYMPTOMS TO REPORT IMMEDIATELY:   Following lower endoscopy (colonoscopy or flexible sigmoidoscopy):  Excessive amounts of blood in the stool  Significant tenderness or worsening of abdominal pains  Swelling of the abdomen that is new, acute  Fever of 100F or higher  For urgent or emergent issues, a gastroenterologist can be reached at any hour by calling [phone].   DIET:  We do recommend a small meal at first, but then you may proceed to your regular diet.  Drink plenty of fluids but you should avoid alcoholic beverages for 24 hours.  ACTIVITY:  You should plan to take it easy for the rest of today and you should NOT DRIVE or use heavy machinery until tomorrow (because of the sedation medicines used during the test).     FOLLOW UP: Our staff will call the number listed on your records the next business day following your procedure to check on you and address any questions or concerns that you may have regarding the information given to you following your procedure. If we do not reach you, we will leave a message.  However, if you are feeling well and you are not experiencing any problems, there is no need to return our call.  We will assume that you have returned to your regular daily activities without incident.  If any biopsies were taken you will be contacted by phone or by letter within the next 1-3 weeks.  Please call us at [phone] if you have not heard about the biopsies in 3 weeks.   Await for biopsy results Polyps (handout given) Diverticulosis (handout given) Hemorrhoids (handout given)   SIGNATURES/CONFIDENTIALITY: You and/or your care partner have signed paperwork which will be entered into your electronic medical record.  These signatures attest to the fact that that the information above on your After Visit Summary has been reviewed and is understood.  Full responsibility of the confidentiality of this discharge information lies with you and/or your care-partner. 

## 2017-09-30 NOTE — Progress Notes (Signed)
To PACU, VSS. Report to [email or web address] 

## 2017-09-30 NOTE — Op Note (Signed)
[facility] Patient Name: Todd [name] Procedure Date: 09/30/2017 11:30 AM MRN: [id number] Endoscopist: Remo Lipps P. Havery Moros , MD Age: 62 Referring MD:  Date of Birth: [birth year] Gender: Male Account #: [id number] Procedure:                Colonoscopy Indications:              High risk colon cancer surveillance: Personal                            history of colon cancer, history of Lynch syndrome Medicines:                Monitored Anesthesia Care Procedure:                Pre-Anesthesia Assessment:                           - Prior to the procedure, a History and Physical                            was performed, and patient medications and                            allergies were reviewed. The patient's tolerance of                            previous anesthesia was also reviewed. The risks                            and benefits of the procedure and the sedation                            options and risks were discussed with the patient.                            All questions were answered, and informed consent                            was obtained. Prior Anticoagulants: The patient has                            taken no previous anticoagulant or antiplatelet                            agents. ASA Grade Assessment: III - A patient with                            severe systemic disease. After reviewing the risks                            and benefits, the patient was deemed in                            satisfactory condition to undergo the procedure.  After obtaining informed consent, the colonoscope                            was passed under direct vision. Throughout the                            procedure, the patient's blood pressure, pulse, and                            oxygen saturations were monitored continuously. The                            Colonoscope was introduced through the anus and                            advanced  to the the cecum, identified by                            appendiceal orifice and ileocecal valve. The                            colonoscopy was performed without difficulty. The                            patient tolerated the procedure well. The quality                            of the bowel preparation was adequate. The                            ileocecal valve, appendiceal orifice, and rectum                            were photographed. Scope In: 11:35:07 AM Scope Out: 11:58:05 AM Scope Withdrawal Time: 0 hours 19 minutes 3 seconds  Total Procedure Duration: 0 hours 22 minutes 58 seconds  Findings:                 The perianal and digital rectal examinations were                            normal.                           A 4 mm polyp was found in the splenic flexure. The                            polyp was sessile. The polyp was removed with a                            cold snare. Resection and retrieval were complete.                           A 3 mm polyp was found in the descending colon. The  polyp was sessile. The polyp was removed with a                            cold snare. Resection and retrieval were complete.                           A 3 mm polyp was found in the recto-sigmoid colon.                            The polyp was sessile. The polyp was removed with a                            cold snare. Resection and retrieval were complete.                           A 3 mm polyp was found in the rectum. The polyp was                            sessile. The polyp was removed with a cold snare.                            Resection and retrieval were complete.                           Scattered small-mouthed diverticula were found in                            the entire colon.                           There was evidence of a prior end-to-end                            colo-colonic anastomosis in the transverse colon.                             This was patent and was characterized by healthy                            appearing mucosa.                           Internal hemorrhoids were found during retroflexion.                           There was residual liquid stool throughout the                            colon. Time was taken to lavage the colon to                            achieve adequate views. The exam was otherwise  without abnormality. Complications:            No immediate complications. Estimated blood loss:                            Minimal. Estimated Blood Loss:     Estimated blood loss was minimal. Impression:               - One 4 mm polyp at the splenic flexure, removed                            with a cold snare. Resected and retrieved.                           - One 3 mm polyp in the descending colon, removed                            with a cold snare. Resected and retrieved.                           - One 3 mm polyp at the recto-sigmoid colon,                            removed with a cold snare. Resected and retrieved.                           - One 3 mm polyp in the rectum, removed with a cold                            snare. Resected and retrieved.                           - Diverticulosis in the entire examined colon.                           - Patent end-to-end colo-colonic anastomosis,                            characterized by healthy appearing mucosa.                           - Internal hemorrhoids.                           - The examination was otherwise normal. Recommendation:           - Patient has a contact number available for                            emergencies. The signs and symptoms of potential                            delayed complications were discussed with the                            patient. Return  to normal activities tomorrow.                            Written discharge instructions were provided to the                             patient.                           - Resume previous diet.                           - Continue present medications.                           - Await pathology results with further                            recommendations Remo Lipps P. Kristin Lamagna, MD 09/30/2017 12:03:30 PM This report has been signed electronically.

## 2017-10-03 ENCOUNTER — Telehealth: Payer: Self-pay | Admitting: *Deleted

## 2017-10-03 NOTE — Telephone Encounter (Signed)
No answer for post procedure call back and no answering machine available. Will attempt to call back later this afternoon. Sm

## 2017-10-07 ENCOUNTER — Encounter: Payer: Self-pay | Admitting: Gastroenterology

## 2017-11-14 ENCOUNTER — Institutional Professional Consult (permissible substitution): Payer: Self-pay | Admitting: Pulmonary Disease

## 2017-12-07 ENCOUNTER — Institutional Professional Consult (permissible substitution): Payer: Self-pay | Admitting: Pulmonary Disease

## 2018-04-20 ENCOUNTER — Institutional Professional Consult (permissible substitution): Payer: Self-pay | Admitting: Pulmonary Disease

## 2018-05-08 ENCOUNTER — Ambulatory Visit: Payer: Self-pay | Admitting: Gastroenterology

## 2018-09-28 ENCOUNTER — Encounter: Payer: Self-pay | Admitting: Gastroenterology

## 2018-10-11 ENCOUNTER — Encounter: Payer: Self-pay | Admitting: Gastroenterology

## 2018-10-26 ENCOUNTER — Ambulatory Visit: Payer: Medicare PPO | Admitting: *Deleted

## 2018-10-26 ENCOUNTER — Other Ambulatory Visit: Payer: Self-pay

## 2018-10-26 VITALS — Ht 70.0 in | Wt 240.0 lb

## 2018-10-26 DIAGNOSIS — Z85038 Personal history of other malignant neoplasm of large intestine: Secondary | ICD-10-CM

## 2018-10-26 MED ORDER — SUPREP BOWEL PREP KIT 17.5-3.13-1.6 GM/177ML PO SOLN: 1.0000 | Freq: Once | ORAL | 0 refills | Status: AC

## 2018-10-26 NOTE — Progress Notes (Signed)
Patient denies any allergies to egg or soy products. Patient denies complications with anesthesia/sedation.  Patient denies oxygen use at home and denies diet medications.   Pt verified name, DOB, address and insurance during PV today. Pt mailed instruction packet to included paper to complete and mail back to [facility] with addressed and stamped envelope, Emmi video, copy of consent form to read and not return, and instructions.  PV completed over the phone. Pt encouraged to call with questions or issues.   

## 2018-11-07 ENCOUNTER — Telehealth: Payer: Self-pay | Admitting: Gastroenterology

## 2018-11-07 NOTE — Telephone Encounter (Signed)
NO answer and no vmail setup Covid-19 Screening Questions:  Do you now or have you had a fever in the last 14 days?   Do you have any respiratory symptoms of shortness of breath or cough now or in the last 14 days?   Do you have any family members or close contacts with diagnosed or suspected Covid-19 in the past 14 days?   Have you been tested for Covid-19 and found to be positive?   Pt made aware of that care partner may wait in the car or come up to the lobby during the procedure but will need to provide their own mask.

## 2018-11-08 ENCOUNTER — Encounter: Payer: Self-pay | Admitting: Gastroenterology

## 2018-11-08 ENCOUNTER — Ambulatory Visit (AMBULATORY_SURGERY_CENTER): Payer: Medicare PPO | Admitting: Gastroenterology

## 2018-11-08 ENCOUNTER — Other Ambulatory Visit: Payer: Self-pay

## 2018-11-08 VITALS — BP 126/74 | HR 72 | Temp 97.6°F | Resp 15 | Ht 70.0 in | Wt 233.0 lb

## 2018-11-08 DIAGNOSIS — K2970 Gastritis, unspecified, without bleeding: Secondary | ICD-10-CM

## 2018-11-08 DIAGNOSIS — D122 Benign neoplasm of ascending colon: Secondary | ICD-10-CM | POA: Diagnosis not present

## 2018-11-08 DIAGNOSIS — K449 Diaphragmatic hernia without obstruction or gangrene: Secondary | ICD-10-CM

## 2018-11-08 DIAGNOSIS — Z85038 Personal history of other malignant neoplasm of large intestine: Secondary | ICD-10-CM | POA: Diagnosis not present

## 2018-11-08 DIAGNOSIS — D128 Benign neoplasm of rectum: Secondary | ICD-10-CM

## 2018-11-08 DIAGNOSIS — Z1509 Genetic susceptibility to other malignant neoplasm: Secondary | ICD-10-CM | POA: Diagnosis not present

## 2018-11-08 MED ORDER — SODIUM CHLORIDE 0.9 % IV SOLN: 500.0000 mL | Freq: Once | INTRAVENOUS | Status: DC

## 2018-11-08 NOTE — Progress Notes (Signed)
Report given to PACU, vss 

## 2018-11-08 NOTE — Patient Instructions (Signed)
Please read handouts provided. Await pathology results. Continue present medications.      YOU HAD AN ENDOSCOPIC PROCEDURE TODAY AT THE [facility]:   Refer to the procedure report that was given to you for any specific questions about what was found during the examination.  If the procedure report does not answer your questions, please call your gastroenterologist to clarify.  If you requested that your care partner not be given the details of your procedure findings, then the procedure report has been included in a sealed envelope for you to review at your convenience later.  YOU SHOULD EXPECT: Some feelings of bloating in the abdomen. Passage of more gas than usual.  Walking can help get rid of the air that was put into your GI tract during the procedure and reduce the bloating. If you had a lower endoscopy (such as a colonoscopy or flexible sigmoidoscopy) you may notice spotting of blood in your stool or on the toilet paper. If you underwent a bowel prep for your procedure, you may not have a normal bowel movement for a few days.  Please Note:  You might notice some irritation and congestion in your nose or some drainage.  This is from the oxygen used during your procedure.  There is no need for concern and it should clear up in a day or so.  SYMPTOMS TO REPORT IMMEDIATELY:   Following lower endoscopy (colonoscopy or flexible sigmoidoscopy):  Excessive amounts of blood in the stool  Significant tenderness or worsening of abdominal pains  Swelling of the abdomen that is new, acute  Fever of 100F or higher   Following upper endoscopy (EGD)  Vomiting of blood or coffee ground material  New chest pain or pain under the shoulder blades  Painful or persistently difficult swallowing  New shortness of breath  Fever of 100F or higher  Black, tarry-looking stools  For urgent or emergent issues, a gastroenterologist can be reached at any hour by calling [phone]  [phone].   DIET:  We do recommend a small meal at first, but then you may proceed to your regular diet.  Drink plenty of fluids but you should avoid alcoholic beverages for 24 hours.  ACTIVITY:  You should plan to take it easy for the rest of today and you should NOT DRIVE or use heavy machinery until tomorrow (because of the sedation medicines used during the test).    FOLLOW UP: Our staff will call the number listed on your records 48-72 hours following your procedure to check on you and address any questions or concerns that you may have regarding the information given to you following your procedure. If we do not reach you, we will leave a message.  We will attempt to reach you two times.  During this call, we will ask if you have developed any symptoms of COVID 19. If you develop any symptoms (ie: fever, flu-like symptoms, shortness of breath, cough etc.) before then, please call [phone].  If you test positive for Covid 19 in the 2 weeks post procedure, please call and report this information to us.    If any biopsies were taken you will be contacted by phone or by letter within the next 1-3 weeks.  Please call us at [phone] if you have not heard about the biopsies in 3 weeks.    SIGNATURES/CONFIDENTIALITY: You and/or your care partner have signed paperwork which will be entered into your electronic medical record.  These signatures attest to the fact   that that the information above on your After Visit Summary has been reviewed and is understood.  Full responsibility of the confidentiality of this discharge information lies with you and/or your care-partner.

## 2018-11-08 NOTE — Progress Notes (Signed)
Called to room to assist during endoscopic procedure.  Patient ID and intended procedure confirmed with present staff. Received instructions for my participation in the procedure from the performing physician.  

## 2018-11-08 NOTE — Op Note (Signed)
[facility] Patient Name: Todd [name] Procedure Date: 11/08/2018 2:38 PM MRN: [id number] Endoscopist: Remo Lipps P. Havery Moros , MD Age: 63 Referring MD:  Date of Birth: [birth year] Gender: Male Account #: [id number] Procedure:                Upper GI endoscopy Indications:              Hereditary nonpolyposis colorectal cancer (Lynch                            Syndrome) Medicines:                Monitored Anesthesia Care Procedure:                Pre-Anesthesia Assessment:                           - Prior to the procedure, a History and Physical                            was performed, and patient medications and                            allergies were reviewed. The patient's tolerance of                            previous anesthesia was also reviewed. The risks                            and benefits of the procedure and the sedation                            options and risks were discussed with the patient.                            All questions were answered, and informed consent                            was obtained. Prior Anticoagulants: The patient has                            taken no previous anticoagulant or antiplatelet                            agents. ASA Grade Assessment: III - A patient with                            severe systemic disease. After reviewing the risks                            and benefits, the patient was deemed in                            satisfactory condition to undergo the procedure.  After obtaining informed consent, the endoscope was                            passed under direct vision. Throughout the                            procedure, the patient's blood pressure, pulse, and                            oxygen saturations were monitored continuously. The                            Endoscope was introduced through the mouth, and                            advanced to the second part of duodenum. The  upper                            GI endoscopy was accomplished without difficulty.                            The patient tolerated the procedure well. Scope In: Scope Out: Findings:                 Esophagogastric landmarks were identified: the                            Z-line was found at 39 cm, the gastroesophageal                            junction was found at 39 cm and the upper extent of                            the gastric folds was found at 41 cm from the                            incisors.                           A 2 cm hiatal hernia was present.                           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                           The z line was slightly irregular but did not meet                            criteria for Barrett's biopsies. The exam of the  esophagus was otherwise normal.                           Patchy mild inflammation characterized by adherent                            blood and erythema was found in the gastric body                            without focal ulceration.                           The exam of the stomach was otherwise normal.                           Biopsies were taken with a cold forceps in the                            gastric body, at the incisura and in the gastric                            antrum for Helicobacter pylori testing.                           The duodenal bulb and second portion of the                            duodenum were normal. Complications:            No immediate complications. Estimated blood loss:                            Minimal. Estimated Blood Loss:     Estimated blood loss was minimal. Impression:               - Esophagogastric landmarks identified.                           - 2 cm hiatal hernia.                           - Ectopic gastric mucosa in the upper third of the                             esophagus.                           - Slightly irregular z-line as outlined, otherwise                            normal esophagus.                           - Mild patchy gastritis in proximal stomach,                            otherwise normal  stomach. Biopsies taken to rule                            out H pylori.                           - Normal duodenal bulb and second portion of the                            duodenum. Recommendation:           - Patient has a contact number available for                            emergencies. The signs and symptoms of potential                            delayed complications were discussed with the                            patient. Return to normal activities tomorrow.                            Written discharge instructions were provided to the                            patient.                           - Resume previous diet.                           - Continue present medications.                           - Await pathology results. Remo Lipps P. Xenia Nile, MD 11/08/2018 3:18:14 PM This report has been signed electronically.

## 2018-11-08 NOTE — Op Note (Signed)
[facility] Patient Name: Todd [name] Procedure Date: 11/08/2018 2:38 PM MRN: [id number] Endoscopist: Remo Lipps P. Havery Moros , MD Age: 63 Referring MD:  Date of Birth: 1955-05-25 Gender: Male Account #: [id number] Procedure:                Colonoscopy Indications:              Lynch Syndrome, prior history of colon cancer Medicines:                Monitored Anesthesia Care Procedure:                Pre-Anesthesia Assessment:                           - Prior to the procedure, a History and Physical                            was performed, and patient medications and                            allergies were reviewed. The patient's tolerance of                            previous anesthesia was also reviewed. The risks                            and benefits of the procedure and the sedation                            options and risks were discussed with the patient.                            All questions were answered, and informed consent                            was obtained. Prior Anticoagulants: The patient has                            taken no previous anticoagulant or antiplatelet                            agents. ASA Grade Assessment: III - A patient with                            severe systemic disease. After reviewing the risks                            and benefits, the patient was deemed in                            satisfactory condition to undergo the procedure.                           After obtaining informed consent, the colonoscope  was passed under direct vision. Throughout the                            procedure, the patient's blood pressure, pulse, and                            oxygen saturations were monitored continuously. The                            Colonoscope was introduced through the anus and                            advanced to the the terminal ileum, with                            identification of the  appendiceal orifice and IC                            valve. The colonoscopy was performed without                            difficulty. The patient tolerated the procedure                            well. The quality of the bowel preparation was                            good. The terminal ileum, ileocecal valve,                            appendiceal orifice, and rectum were photographed. Scope In: 2:52:57 PM Scope Out: 3:07:44 PM Scope Withdrawal Time: 0 hours 12 minutes 43 seconds  Total Procedure Duration: 0 hours 14 minutes 47 seconds  Findings:                 The perianal and digital rectal examinations were                            normal.                           The terminal ileum appeared normal.                           A 3 mm polyp was found in the ascending colon. The                            polyp was sessile. The polyp was removed with a                            cold snare. Resection and retrieval were complete.                           A diminutive polyp was found in the rectum. The  polyp was sessile. The polyp was removed with a                            cold snare. Resection and retrieval were complete.                           There was evidence of a prior end-to-end                            colo-colonic anastomosis in the transverse colon.                            This was patent and was characterized by healthy                            appearing mucosa.                           Scattered small-mouthed diverticula were found in                            the entire colon.                           There were small internal hemorrhoids. The exam was                            otherwise without abnormality. Complications:            No immediate complications. Estimated blood loss:                            Minimal. Estimated Blood Loss:     Estimated blood loss was minimal. Impression:               - The examined  portion of the ileum was normal.                           - One 3 mm polyp in the ascending colon, removed                            with a cold snare. Resected and retrieved.                           - One diminutive polyp in the rectum, removed with                            a cold snare. Resected and retrieved.                           - Patent end-to-end colo-colonic anastomosis,                            characterized by healthy appearing mucosa.                           -  Diverticulosis in the entire examined colon.                           - Internal hemorrhoids.                           - The examination was otherwise normal. Recommendation:           - Patient has a contact number available for                            emergencies. The signs and symptoms of potential                            delayed complications were discussed with the                            patient. Return to normal activities tomorrow.                            Written discharge instructions were provided to the                            patient.                           - Resume previous diet.                           - Continue present medications.                           - Await pathology results. Remo Lipps P. , MD 11/08/2018 3:12:59 PM This report has been signed electronically.

## 2018-11-08 NOTE — Progress Notes (Signed)
Pt's states no medical or surgical changes since previsit or office visit.  June Bullock took temp and Courtney Washington took vitals. 

## 2018-11-10 ENCOUNTER — Telehealth: Payer: Self-pay

## 2018-11-10 NOTE — Telephone Encounter (Signed)
Follow up call after pt's endoscopy, unable to leave a message due to vm not being set up.

## 2018-11-10 NOTE — Telephone Encounter (Signed)
  Follow up Call-  Call back number 11/08/2018 09/30/2017 05/04/2016  Post procedure Call Back phone  # [phone]  Permission to leave phone message Yes Yes Yes  Some recent data might be hidden     Patient questions:  Do you have a fever, pain , or abdominal swelling? No. Pain Score  0 *  Have you tolerated food without any problems? Yes.    Have you been able to return to your normal activities? Yes.    Do you have any questions about your discharge instructions: Diet   No. Medications  Yes.   Follow up visit  No.  Do you have questions or concerns about your Care? No.  Actions: * If pain score is 4 or above: No action needed, pain <4.  Pt. Had questions regarding effectiveness of "stomach medicine".  Advised to contact Dr. Doyne Keel office to discuss any changes to medications.  Pt. Agreed.  1. Have you developed a fever since your procedure? no  2.   Have you had an respiratory symptoms (SOB or cough) since your procedure? no  3.   Have you tested positive for COVID 19 since your procedure no  4.   Have you had any family members/close contacts diagnosed with the COVID 19 since your procedure?  no   If yes to any of these questions please route to Joylene John, RN and Alphonsa Gin, Therapist, sports.

## 2018-12-25 ENCOUNTER — Telehealth: Payer: Self-pay | Admitting: Gastroenterology

## 2018-12-25 NOTE — Telephone Encounter (Signed)
Discussed result letter that was sent to him via mychart and he is aware.

## 2019-05-21 ENCOUNTER — Telehealth: Payer: Self-pay | Admitting: Gastroenterology

## 2019-05-21 ENCOUNTER — Other Ambulatory Visit: Payer: Self-pay

## 2019-05-21 MED ORDER — PANTOPRAZOLE SODIUM 40 MG PO TBEC: 40.0000 mg | DELAYED_RELEASE_TABLET | Freq: Two times a day (BID) | ORAL | 0 refills | Status: AC

## 2019-05-21 NOTE — Progress Notes (Signed)
EGD in 10-2018.  Refill sent to pharmacy on file

## 2019-09-07 ENCOUNTER — Encounter: Payer: Self-pay | Admitting: Gastroenterology

## 2019-12-04 ENCOUNTER — Encounter: Payer: Self-pay | Admitting: Gastroenterology

## 2019-12-27 ENCOUNTER — Telehealth: Payer: Self-pay | Admitting: *Deleted

## 2019-12-27 ENCOUNTER — Other Ambulatory Visit: Payer: Self-pay

## 2019-12-27 ENCOUNTER — Ambulatory Visit (AMBULATORY_SURGERY_CENTER): Payer: Medicare PPO | Admitting: *Deleted

## 2019-12-27 VITALS — Ht 70.0 in | Wt 210.0 lb

## 2019-12-27 DIAGNOSIS — Z1509 Genetic susceptibility to other malignant neoplasm: Secondary | ICD-10-CM

## 2019-12-27 DIAGNOSIS — Z860100 Personal history of colon polyps, unspecified: Secondary | ICD-10-CM

## 2019-12-27 DIAGNOSIS — Z85038 Personal history of other malignant neoplasm of large intestine: Secondary | ICD-10-CM

## 2019-12-27 MED ORDER — NA SULFATE-K SULFATE-MG SULF 17.5-3.13-1.6 GM/177ML PO SOLN: ORAL | 0 refills | Status: DC

## 2019-12-27 NOTE — Telephone Encounter (Signed)
Thanks Robbin, He had an EGD last year which looked pretty normal. We will proceed with colonoscopy on October 1st. He may indeed warrant a CT Scan but I would like to get more history to determine if that is the right thing for him. If he is seeing his PCP he can have that addressed with them or I can discuss it with him when I see him for his colonoscopy in 2 weeks. Thanks

## 2019-12-27 NOTE — Progress Notes (Signed)
Patient's pre-visit was done today over the phone with the patient due to COVID-19 pandemic. Name,DOB and address verified. Insurance verified. Packet of Prep instructions mailed to patient including copy of a consent form -pt is aware. Patient understands to call us back with any questions or concerns. COVID-19 vaccines completed in August 2021 per pt. Pt is aware that care partner will wait in the car during procedure; if they feel like they will be too hot or cold to wait in the car; they may wait in the 4 th floor lobby. Patient is aware to bring only one care partner. We want them to wear a mask (we do not have any that we can provide them), practice social distancing, and we will check their temperatures when they get here.  I did remind the patient that their care partner needs to stay in the parking lot the entire time and have a cell phone available, we will call them when the pt is ready for discharge. Patient will wear mask into building.

## 2019-12-27 NOTE — Telephone Encounter (Signed)
Dr.Armbruster,  This patient had a phone pre-visit today for a ONE year recall colon for Lynch syndrome. He explained to me that he is really concerned about having weight loss. He states he eats and is not trying to lose weight. Patient states he weighs 210 lbs, I do see last July 2020 at last colon he was 233 lbs. Patient was asking if we could do an "xray" because we did that last time in 2015 and found bladder cancer. I encouraged the patient to contact his PCP to discuss the weight loss with them but I also wanted to let you know this was going on before his colonoscopy on 01/11/2020. Thank you, Aneliese Beaudry pv

## 2019-12-27 NOTE — Telephone Encounter (Signed)
Patient called and given Dr.Armbruster's recommendations.

## 2020-01-11 ENCOUNTER — Ambulatory Visit (AMBULATORY_SURGERY_CENTER): Payer: Medicare PPO | Admitting: Gastroenterology

## 2020-01-11 ENCOUNTER — Encounter: Payer: Self-pay | Admitting: Gastroenterology

## 2020-01-11 ENCOUNTER — Other Ambulatory Visit: Payer: Self-pay

## 2020-01-11 ENCOUNTER — Other Ambulatory Visit (INDEPENDENT_AMBULATORY_CARE_PROVIDER_SITE_OTHER): Payer: Medicare PPO

## 2020-01-11 VITALS — BP 128/72 | HR 66 | Temp 98.9°F | Resp 16 | Ht 70.0 in | Wt 210.0 lb

## 2020-01-11 DIAGNOSIS — Z1509 Genetic susceptibility to other malignant neoplasm: Secondary | ICD-10-CM

## 2020-01-11 DIAGNOSIS — Z85038 Personal history of other malignant neoplasm of large intestine: Secondary | ICD-10-CM

## 2020-01-11 DIAGNOSIS — K5730 Diverticulosis of large intestine without perforation or abscess without bleeding: Secondary | ICD-10-CM

## 2020-01-11 DIAGNOSIS — Z860100 Personal history of colon polyps, unspecified: Secondary | ICD-10-CM

## 2020-01-11 DIAGNOSIS — D123 Benign neoplasm of transverse colon: Secondary | ICD-10-CM

## 2020-01-11 DIAGNOSIS — D124 Benign neoplasm of descending colon: Secondary | ICD-10-CM

## 2020-01-11 DIAGNOSIS — R634 Abnormal weight loss: Secondary | ICD-10-CM

## 2020-01-11 DIAGNOSIS — K648 Other hemorrhoids: Secondary | ICD-10-CM

## 2020-01-11 LAB — BUN: BUN: 15 mg/dL (ref 6–23)

## 2020-01-11 LAB — CREATININE, SERUM: Creatinine, Ser: 1.56 mg/dL — ABNORMAL HIGH (ref 0.40–1.50)

## 2020-01-11 MED ORDER — SODIUM CHLORIDE 0.9 % IV SOLN: 500.0000 mL | Freq: Once | INTRAVENOUS | Status: DC

## 2020-01-11 NOTE — Progress Notes (Signed)
pt tolerated well. VSS. awake and to recovery. Report given to RN.  

## 2020-01-11 NOTE — Progress Notes (Signed)
Called to room to assist during endoscopic procedure.  Patient ID and intended procedure confirmed with present staff. Received instructions for my participation in the procedure from the performing physician.  

## 2020-01-11 NOTE — Patient Instructions (Signed)
Handout on polyps, diverticulosis and hemorrhoids given.  CT contrast and instruction sheet given. To lab before discharge.   YOU HAD AN ENDOSCOPIC PROCEDURE TODAY AT [facility]:   Refer to the procedure report that was given to you for any specific questions about what was found during the examination.  If the procedure report does not answer your questions, please call your gastroenterologist to clarify.  If you requested that your care partner not be given the details of your procedure findings, then the procedure report has been included in a sealed envelope for you to review at your convenience later.  YOU SHOULD EXPECT: Some feelings of bloating in the abdomen. Passage of more gas than usual.  Walking can help get rid of the air that was put into your GI tract during the procedure and reduce the bloating. If you had a lower endoscopy (such as a colonoscopy or flexible sigmoidoscopy) you may notice spotting of blood in your stool or on the toilet paper. If you underwent a bowel prep for your procedure, you may not have a normal bowel movement for a few days.  Please Note:  You might notice some irritation and congestion in your nose or some drainage.  This is from the oxygen used during your procedure.  There is no need for concern and it should clear up in a day or so.  SYMPTOMS TO REPORT IMMEDIATELY:   Following lower endoscopy (colonoscopy or flexible sigmoidoscopy):  Excessive amounts of blood in the stool  Significant tenderness or worsening of abdominal pains  Swelling of the abdomen that is new, acute  Fever of 100F or higher   For urgent or emergent issues, a gastroenterologist can be reached at any hour by calling [phone]. Do not use MyChart messaging for urgent concerns.    DIET:  We do recommend a small meal at first, but then you may proceed to your regular diet.  Drink plenty of fluids but you should avoid alcoholic beverages for 24  hours.  ACTIVITY:  You should plan to take it easy for the rest of today and you should NOT DRIVE or use heavy machinery until tomorrow (because of the sedation medicines used during the test).    FOLLOW UP: Our staff will call the number listed on your records 48-72 hours following your procedure to check on you and address any questions or concerns that you may have regarding the information given to you following your procedure. If we do not reach you, we will leave a message.  We will attempt to reach you two times.  During this call, we will ask if you have developed any symptoms of COVID 19. If you develop any symptoms (ie: fever, flu-like symptoms, shortness of breath, cough etc.) before then, please call [phone].  If you test positive for Covid 19 in the 2 weeks post procedure, please call and report this information to [place].    If any biopsies were taken you will be contacted by phone or by letter within the next 1-3 weeks.  Please call us at [phone] if you have not heard about the biopsies in 3 weeks.    SIGNATURES/CONFIDENTIALITY: You and/or your care partner have signed paperwork which will be entered into your electronic medical record.  These signatures attest to the fact that that the information above on your After Visit Summary has been reviewed and is understood.  Full responsibility of the confidentiality of this discharge information lies with you and/or  your care-partner. 

## 2020-01-11 NOTE — Op Note (Signed)
[facility] Patient Name: Todd [name] Procedure Date: 01/11/2020 10:00 AM MRN: [id number] Endoscopist: Remo Lipps P. Havery Moros , MD Age: 64 Referring MD:  Date of Birth: 1955-06-21 Gender: Male Account #: [id number] Procedure:                Colonoscopy Indications:              Lynch Syndrome, history of colon cancer, patient                            incidentally endorses significant weight loss over                            the past year (50 lbs) Medicines:                Monitored Anesthesia Care Procedure:                Pre-Anesthesia Assessment:                           - Prior to the procedure, a History and Physical                            was performed, and patient medications and                            allergies were reviewed. The patient's tolerance of                            previous anesthesia was also reviewed. The risks                            and benefits of the procedure and the sedation                            options and risks were discussed with the patient.                            All questions were answered, and informed consent                            was obtained. Prior Anticoagulants: The patient has                            taken no previous anticoagulant or antiplatelet                            agents. ASA Grade Assessment: III - A patient with                            severe systemic disease. After reviewing the risks                            and benefits, the patient was deemed in  satisfactory condition to undergo the procedure.                           After obtaining informed consent, the colonoscope                            was passed under direct vision. Throughout the                            procedure, the patient's blood pressure, pulse, and                            oxygen saturations were monitored continuously. The                            Colonoscope was introduced  through the anus and                            advanced to the the terminal ileum, with                            identification of the appendiceal orifice and IC                            valve. The colonoscopy was performed without                            difficulty. The patient tolerated the procedure                            well. The quality of the bowel preparation was                            adequate. The terminal ileum, ileocecal valve,                            appendiceal orifice, and rectum were photographed. Scope In: 10:25:06 AM Scope Out: 10:42:58 AM Scope Withdrawal Time: 0 hours 14 minutes 43 seconds  Total Procedure Duration: 0 hours 17 minutes 52 seconds  Findings:                 The perianal and digital rectal examinations were                            normal.                           The terminal ileum appeared normal.                           Multiple small-mouthed diverticula were found in                            the left colon, transverse, and right colon.  Two sessile polyps were found in the transverse                            colon. The polyps were 3 to 4 mm in size. These                            polyps were removed with a cold snare. Resection                            and retrieval were complete.                           A 3 mm polyp was found in the descending colon. The                            polyp was sessile. The polyp was removed with a                            cold snare. Resection and retrieval were complete.                           Internal hemorrhoids were found during retroflexion.                           There was a surgical anastomosis in the transverse                            colon. The exam was otherwise without abnormality. Complications:            No immediate complications. Estimated blood loss:                            Minimal. Estimated Blood Loss:     Estimated blood loss  was minimal. Impression:               - The examined portion of the ileum was normal.                           - Mild diverticulosis in the entire colon                           - Two 3 to 4 mm polyps in the transverse colon,                            removed with a cold snare. Resected and retrieved.                           - One 3 mm polyp in the descending colon, removed                            with a cold snare. Resected and retrieved.                           -  Internal hemorrhoids.                           - The examination was otherwise normal.                           No cause for weight loss on this exam. Given                            history of Lynch syndrome with significant weight                            loss recommend CT chest / abdomen / pelvis to                            further evaluate. Recommendation:           - Patient has a contact number available for                            emergencies. The signs and symptoms of potential                            delayed complications were discussed with the                            patient. Return to normal activities tomorrow.                            Written discharge instructions were provided to the                            patient.                           - Resume previous diet.                           - Continue present medications.                           - Await pathology results.                           - CT chest / abdomen / pelvis to further evaluate                            weight loss, our office will coordinate Remo Lipps P. Darbi Chandran, MD 01/11/2020 10:51:12 AM This report has been signed electronically.

## 2020-01-15 ENCOUNTER — Telehealth: Payer: Self-pay

## 2020-01-15 NOTE — Telephone Encounter (Signed)
°  Follow up Call-  Call back number 01/11/2020 11/08/2018 09/30/2017  Post procedure Call Back phone  # [phone]  Permission to leave phone message Yes Yes Yes  Some recent data might be hidden     Patient questions:  Do you have a fever, pain , or abdominal swelling? No. Pain Score  0 *  Have you tolerated food without any problems? Yes.    Have you been able to return to your normal activities? Yes.    Do you have any questions about your discharge instructions: Diet   No. Medications  No. Follow up visit  No.  Do you have questions or concerns about your Care? No.  Actions: * If pain score is 4 or above: No action needed, pain <4.  1. Have you developed a fever since your procedure? no  2.   Have you had an respiratory symptoms (SOB or cough) since your procedure? no  3.   Have you tested positive for COVID 19 since your procedure no  4.   Have you had any family members/close contacts diagnosed with the COVID 19 since your procedure?  no   If yes to any of these questions please route to Joylene John, RN and Joella Prince, RN

## 2020-01-21 ENCOUNTER — Ambulatory Visit (HOSPITAL_COMMUNITY): Payer: Medicare PPO

## 2020-01-22 ENCOUNTER — Ambulatory Visit (HOSPITAL_COMMUNITY)
Admission: RE | Admit: 2020-01-22 | Discharge: 2020-01-22 | Disposition: A | Discharge status: Home / Self Care | Payer: Medicare PPO | Source: Ambulatory Visit | Attending: Gastroenterology | Admitting: Gastroenterology

## 2020-01-22 ENCOUNTER — Other Ambulatory Visit: Payer: Self-pay

## 2020-01-22 ENCOUNTER — Encounter (HOSPITAL_COMMUNITY): Payer: Self-pay

## 2020-01-22 DIAGNOSIS — R634 Abnormal weight loss: Secondary | ICD-10-CM | POA: Insufficient documentation

## 2020-01-22 MED ORDER — IOHEXOL 9 MG/ML PO SOLN
ORAL | Status: AC
  Administered 2020-01-22: 500 mL
  Filled 2020-01-22: qty 500

## 2020-01-22 MED ORDER — IOHEXOL 300 MG/ML  SOLN
100.0000 mL | Freq: Once | INTRAMUSCULAR | Status: AC | PRN
  Administered 2020-01-22: 100 mL via INTRAVENOUS

## 2020-01-22 MED ORDER — IOHEXOL 9 MG/ML PO SOLN: 1000.0000 mL | ORAL | Status: AC

## 2020-01-24 ENCOUNTER — Telehealth: Payer: Self-pay | Admitting: Gastroenterology

## 2020-01-24 NOTE — Telephone Encounter (Signed)
Spoke with patient regarding recommendations as outlined by Dr. Havery Moros below.   Pt states that he was previously having a form of treatment every week which he was having to pay a $45 copay every week. Advised patient to reach back out to [facility] since he is an established patient at least for a re-evaluation. Pt agreeable to do this. Advised to reach back out to PCP regarding refill on pain medication and what to take in the mean time until he can get a refill.   Patient verbalized understanding

## 2020-01-24 NOTE — Telephone Encounter (Signed)
Spoke with patient, he reporting pain in his pubic area,pt describes as a pressure that comes and goes, pt states that when he urinates the pain is worse. Pt states that he has been taking Oxycodone 15 mg about 4-5 a day, has been taking more due to the pain. Pt states he only has about 2-3 days left of the prescription. Pt states that he was previously seen by a Urologist at [facility] but would prefer not to return, he did not like the atmosphere. Pt states that he tried [facility] but they wanted to charge him $45 a week and he is on disability. He would like a referral to urology within [place]. Please advise, thank you

## 2020-01-24 NOTE — Telephone Encounter (Signed)
Thanks [place], I think [facility] is the only Urology practice in [place]. If he wishes to be seen somewhere else (another practice of his choice - [place], [place], [place]), etc, we can refer him wherever he wishes to go. He should contact the provider who has been giving him the oxycodone for a refill, or his primary care if he has been taking it through them. I don't prescribe that medication for urologic issues. Thanks

## 2020-01-31 ENCOUNTER — Telehealth: Payer: Self-pay | Admitting: Gastroenterology

## 2020-01-31 NOTE — Telephone Encounter (Signed)
Hey, what medication is he needing a refill on?

## 2020-01-31 NOTE — Telephone Encounter (Signed)
Pt is requesting a refill from Dr Doyne Keel office to the [facility].

## 2021-02-18 ENCOUNTER — Encounter: Payer: Self-pay | Admitting: Gastroenterology

## 2023-02-23 ENCOUNTER — Emergency Department (HOSPITAL_COMMUNITY)
Admission: EM | Admit: 2023-02-23 | Discharge: 2023-02-25 | Disposition: A | Discharge status: Home / Self Care | Payer: 59 | Attending: Emergency Medicine | Admitting: Emergency Medicine

## 2023-02-23 ENCOUNTER — Encounter (HOSPITAL_COMMUNITY): Payer: Self-pay

## 2023-02-23 ENCOUNTER — Emergency Department (HOSPITAL_COMMUNITY): Payer: 59

## 2023-02-23 DIAGNOSIS — R109 Unspecified abdominal pain: Secondary | ICD-10-CM | POA: Diagnosis present

## 2023-02-23 DIAGNOSIS — J449 Chronic obstructive pulmonary disease, unspecified: Secondary | ICD-10-CM | POA: Insufficient documentation

## 2023-02-23 DIAGNOSIS — I10 Essential (primary) hypertension: Secondary | ICD-10-CM | POA: Diagnosis not present

## 2023-02-23 DIAGNOSIS — J45909 Unspecified asthma, uncomplicated: Secondary | ICD-10-CM | POA: Insufficient documentation

## 2023-02-23 DIAGNOSIS — Z85038 Personal history of other malignant neoplasm of large intestine: Secondary | ICD-10-CM | POA: Diagnosis not present

## 2023-02-23 DIAGNOSIS — Z8551 Personal history of malignant neoplasm of bladder: Secondary | ICD-10-CM | POA: Diagnosis not present

## 2023-02-23 DIAGNOSIS — R1084 Generalized abdominal pain: Secondary | ICD-10-CM | POA: Insufficient documentation

## 2023-02-23 LAB — URINALYSIS, W/ REFLEX TO CULTURE (INFECTION SUSPECTED)
Bilirubin Urine: NEGATIVE
Glucose, UA: NEGATIVE mg/dL
Ketones, ur: NEGATIVE mg/dL
Nitrite: POSITIVE — AB
Protein, ur: 100 mg/dL — AB
Specific Gravity, Urine: 1.018 (ref 1.005–1.030)
WBC, UA: >50 WBC/hpf (ref 0–5)
pH: 6 (ref 5.0–8.0)

## 2023-02-23 LAB — COMPREHENSIVE METABOLIC PANEL
ALT: 8 U/L (ref 0–44)
AST: 14 U/L — ABNORMAL LOW (ref 15–41)
Albumin: 3.4 g/dL — ABNORMAL LOW (ref 3.5–5.0)
Alkaline Phosphatase: 50 U/L (ref 38–126)
Anion gap: 10 (ref 5–15)
BUN: 24 mg/dL — ABNORMAL HIGH (ref 8–23)
CO2: 20 mmol/L — ABNORMAL LOW (ref 22–32)
Calcium: 8.5 mg/dL — ABNORMAL LOW (ref 8.9–10.3)
Chloride: 107 mmol/L (ref 98–111)
Creatinine, Ser: 1.65 mg/dL — ABNORMAL HIGH (ref 0.61–1.24)
GFR, Estimated: 46 mL/min — ABNORMAL LOW (ref >60)
Glucose, Bld: 95 mg/dL (ref 70–99)
Potassium: 4 mmol/L (ref 3.5–5.1)
Sodium: 137 mmol/L (ref 135–145)
Total Bilirubin: 0.6 mg/dL (ref <1.2)
Total Protein: 6.9 g/dL (ref 6.5–8.1)

## 2023-02-23 LAB — CBC WITH DIFFERENTIAL/PLATELET
Abs Immature Granulocytes: 0.03 10*3/uL (ref 0.00–0.07)
Basophils Absolute: 0 10*3/uL (ref 0.0–0.1)
Basophils Relative: 0 %
Eosinophils Absolute: 0.2 10*3/uL (ref 0.0–0.5)
Eosinophils Relative: 2 %
HCT: 41.1 % (ref 39.0–52.0)
Hemoglobin: 13.6 g/dL (ref 13.0–17.0)
Immature Granulocytes: 0 %
Lymphocytes Relative: 13 %
Lymphs Abs: 1.3 10*3/uL (ref 0.7–4.0)
MCH: 33.2 pg (ref 26.0–34.0)
MCHC: 33.1 g/dL (ref 30.0–36.0)
MCV: 100.2 fL — ABNORMAL HIGH (ref 80.0–100.0)
Monocytes Absolute: 0.7 10*3/uL (ref 0.1–1.0)
Monocytes Relative: 7 %
Neutro Abs: 7.7 10*3/uL (ref 1.7–7.7)
Neutrophils Relative %: 78 %
Platelets: 396 10*3/uL (ref 150–400)
RBC: 4.1 MIL/uL — ABNORMAL LOW (ref 4.22–5.81)
RDW: 12.5 % (ref 11.5–15.5)
WBC: 10 10*3/uL (ref 4.0–10.5)
nRBC: 0 % (ref 0.0–0.2)

## 2023-02-23 LAB — LIPASE, BLOOD: Lipase: 57 U/L — ABNORMAL HIGH (ref 11–51)

## 2023-02-23 MED ORDER — ONDANSETRON HCL 4 MG/2ML IJ SOLN
4.0000 mg | Freq: Once | INTRAMUSCULAR | Status: AC
  Administered 2023-02-23: 4 mg via INTRAVENOUS
  Filled 2023-02-23: qty 2

## 2023-02-23 MED ORDER — IOHEXOL 300 MG/ML  SOLN
100.0000 mL | Freq: Once | INTRAMUSCULAR | Status: AC | PRN
Start: 2023-02-23 — End: 2023-02-23
  Administered 2023-02-23: 100 mL via INTRAVENOUS

## 2023-02-23 MED ORDER — GABAPENTIN 100 MG PO CAPS
100.0000 mg | ORAL_CAPSULE | Freq: Three times a day (TID) | ORAL | Status: DC
  Administered 2023-02-23 – 2023-02-25 (×6): 100 mg via ORAL
  Filled 2023-02-23 (×6): qty 1

## 2023-02-23 MED ORDER — OXYCODONE HCL 5 MG PO TABS
15.0000 mg | ORAL_TABLET | Freq: Four times a day (QID) | ORAL | Status: DC | PRN
  Administered 2023-02-24 – 2023-02-25 (×4): 15 mg via ORAL
  Filled 2023-02-23 (×4): qty 3

## 2023-02-23 MED ORDER — CLONAZEPAM 1 MG PO TABS
1.0000 mg | ORAL_TABLET | Freq: Three times a day (TID) | ORAL | Status: DC | PRN
  Administered 2023-02-24 (×2): 1 mg via ORAL
  Filled 2023-02-23 (×2): qty 1

## 2023-02-23 MED ORDER — VALSARTAN-HYDROCHLOROTHIAZIDE 160-12.5 MG PO TABS: 1.0000 | ORAL_TABLET | Freq: Every day | ORAL | Status: DC

## 2023-02-23 MED ORDER — MORPHINE SULFATE (PF) 4 MG/ML IV SOLN
4.0000 mg | Freq: Once | INTRAVENOUS | Status: AC
  Administered 2023-02-23: 4 mg via INTRAVENOUS
  Filled 2023-02-23: qty 1

## 2023-02-23 MED ORDER — IRBESARTAN 150 MG PO TABS
150.0000 mg | ORAL_TABLET | Freq: Every day | ORAL | Status: DC
  Administered 2023-02-24 – 2023-02-25 (×2): 150 mg via ORAL
  Filled 2023-02-23 (×2): qty 1

## 2023-02-23 MED ORDER — HYDROCHLOROTHIAZIDE 12.5 MG PO TABS
12.5000 mg | ORAL_TABLET | Freq: Every day | ORAL | Status: DC
  Administered 2023-02-24 – 2023-02-25 (×2): 12.5 mg via ORAL
  Filled 2023-02-23 (×2): qty 1

## 2023-02-23 NOTE — ED Provider Notes (Signed)
[place] EMERGENCY DEPARTMENT AT [facility] Provider Note   CSN: [id number] Arrival date & time: 02/23/23  1030     History  Chief Complaint  Patient presents with   Abdominal Pain   Ostomy Issue    Todd [name]. is a 67 y.o. male.  67 year old male presents today for concern of abdominal pain.  He has history of bladder cancer, and colon cancer and has undergone colostomy placement.  Follows with [facility].  He was seen at [facility] emergency department 2 days ago and had ostomy dressing changed and discharged to his follow-up appointment with his surgeon yesterday.  Family brings him in today because they are concerned that he is not fit to care for himself and he does not have anywhere to live.  They are requesting SNF placement and also asking why he states pain still significant.  Denies fever, or drainage around the ostomy site.  In the ostomy bag.  The history is provided by the patient. No language interpreter was used.       Home Medications Prior to Admission medications   Medication Sig Start Date End Date Taking? Authorizing Provider  albuterol (PROVENTIL HFA;VENTOLIN HFA) 108 (90 BASE) MCG/ACT inhaler Inhale 1 puff into the lungs every 6 (six) hours as needed for wheezing or shortness of breath.    [provider]  budesonide-formoterol (SYMBICORT) 160-4.5 MCG/ACT inhaler Inhale 2 puffs into the lungs 2 (two) times daily.    [provider]  clonazePAM (KLONOPIN) 1 MG tablet Take 1 mg by mouth 3 (three) times daily as needed. 09/19/17   [provider]  fluticasone (FLONASE) 50 MCG/ACT nasal spray USE 2 SPRAYS IN EACH NOSTRIL ONCE DAILY AS NEEDED 02/24/15   [provider]  Na Sulfate-K Sulfate-Mg Sulf 17.5-3.13-1.6 GM/177ML SOLN Suprep (no substitutions)-TAKE AS DIRECTED. 12/27/19   Armbruster, Willaim Rayas, MD  naproxen sodium (ALEVE) 220 MG tablet Take by mouth.    [provider]  oxyCODONE (ROXICODONE) 15 MG immediate  release tablet  05/02/18   [provider]  pantoprazole (PROTONIX) 40 MG tablet Take 1 tablet (40 mg total) by mouth 2 (two) times daily. 05/21/19   Armbruster, Willaim Rayas, MD      Allergies    Clindamycin/lincomycin, Milk-related compounds, Whey, and Hydrocodone    Review of Systems   Review of Systems  Constitutional:  Negative for chills and fever.  Respiratory:  Negative for shortness of breath.   Gastrointestinal:  Positive for abdominal pain. Negative for nausea and vomiting.  Neurological:  Negative for light-headedness.  All other systems reviewed and are negative.   Physical Exam Updated Vital Signs BP 139/79 (BP Location: Left Arm)   Pulse 80   Temp 99.5 F (37.5 C) (Oral)   Resp 16   Ht 5\' 10"  (1.778 m)   Wt 81.2 kg   SpO2 99%   BMI 25.68 kg/m  Physical Exam Vitals and nursing note reviewed.  Constitutional:      General: He is not in acute distress.    Appearance: Normal appearance. He is not ill-appearing.  HENT:     Head: Normocephalic and atraumatic.     Nose: Nose normal.  Eyes:     General: No scleral icterus.    Extraocular Movements: Extraocular movements intact.     Conjunctiva/sclera: Conjunctivae normal.  Cardiovascular:     Rate and Rhythm: Normal rate and regular rhythm.     Heart sounds: Normal heart sounds.  Pulmonary:  Effort: Pulmonary effort is normal. No respiratory distress.     Breath sounds: Normal breath sounds. No wheezing or rales.  Abdominal:     General: There is no distension.     Palpations: Abdomen is soft.     Tenderness: There is abdominal tenderness. There is no guarding.  Musculoskeletal:        General: Normal range of motion.     Cervical back: Normal range of motion.  Skin:    General: Skin is warm and dry.  Neurological:     General: No focal deficit present.     Mental Status: He is alert. Mental status is at baseline.     ED Results / Procedures / Treatments   Labs (all labs ordered are listed,  but only abnormal results are displayed) Labs Reviewed  COMPREHENSIVE METABOLIC PANEL  CBC WITH DIFFERENTIAL/PLATELET  LIPASE, BLOOD  URINALYSIS, W/ REFLEX TO CULTURE (INFECTION SUSPECTED)    EKG None  Radiology No results found.  Procedures Procedures    Medications Ordered in ED Medications  morphine (PF) 4 MG/ML injection 4 mg (4 mg Intravenous Given 02/23/23 1257)  ondansetron (ZOFRAN) injection 4 mg (4 mg Intravenous Given 02/23/23 1258)    ED Course/ Medical Decision Making/ A&P                                 Medical Decision Making Amount and/or Complexity of Data Reviewed Labs: ordered. Radiology: ordered.  Risk Prescription drug management.   Medical Decision Making / ED Course   This patient presents to the ED for concern of abdominal pain, this involves an extensive number of treatment options, and is a complaint that carries with it a high risk of complications and morbidity.  The differential diagnosis includes gastroenteritis, colitis, diverticulitis, postop complication, chronic pain  MDM: 67 year old male presents today for concern of abdominal pain.  History of cancer of his bladder and colon.  Undergoing colostomy.  Follows with [facility].  Saw his surgeon yesterday.  Is brought in by his daughters because they are unable to care for him and feel he needs SNF placement.  Will provide pain control, obtain labs, and obtain CT imaging.  Placed TOC consult with PT and OT eval as well.  Additional history obtained: -Additional history obtained from daughters at bedside, care everywhere with his recent ED visit as well as surgical visit -External records from outside source obtained and reviewed including: Chart review including previous notes, labs, imaging, consultation notes   Lab Tests: -I ordered, reviewed, and interpreted labs.   The pertinent results include:   Labs Reviewed  COMPREHENSIVE METABOLIC PANEL  CBC WITH DIFFERENTIAL/PLATELET  LIPASE,  BLOOD  URINALYSIS, W/ REFLEX TO CULTURE (INFECTION SUSPECTED)      EKG  EKG Interpretation Date/Time:    Ventricular Rate:    PR Interval:    QRS Duration:    QT Interval:    QTC Calculation:   R Axis:      Text Interpretation:          Medicines ordered and prescription drug management: Meds ordered this encounter  Medications   morphine (PF) 4 MG/ML injection 4 mg   ondansetron (ZOFRAN) injection 4 mg    -I have reviewed the patients home medicines and have made adjustments as needed  Co morbidities that complicate the patient evaluation  Past Medical History:  Diagnosis Date   Allergy    Anxiety  Arthritis    Asthma    Bladder cancer (HCC) dx'd 10/2013   recurrent   Chronic gastritis    Chronic low back pain    Colon cancer (HCC) dx'd 09/2011   COPD (chronic obstructive pulmonary disease) (HCC)    Depression    Diverticulosis    Dysuria    GERD (gastroesophageal reflux disease)    History of acute pancreatitis    due to SIRS   History of adenomatous polyp of colon    04-04-2015  last colonoscopy w/ multiple polyps--  tubular adenomas, beign polypoid colonic mucous   History of bladder cancer monitory by  dr Vernie Ammons   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  If his medical workup is reassuring he will need to board in the emergency department until [facility], PT, and OT eval's are complete.  Final Clinical Impression(s) / ED  Diagnoses Final diagnoses:  Generalized abdominal pain    Rx / DC Orders ED Discharge Orders     None         Marita Kansas, PA-C 02/23/23 1500    Alvira Monday, MD 02/23/23 2228

## 2023-02-23 NOTE — Evaluation (Signed)
Physical Therapy Evaluation Patient Details Name: Todd [name]. MRN: [id number] DOB: 03-Aug-1955 Today's Date: 02/23/2023  History of Present Illness  Pt is 67 yo male presented to ED on 02/23/23 with concern of abdominal pain and difficulty caring for self at home.  Pt with hx of bladder and colon CA - underwent colostomy placement at [facility] ~ a week ago.  Other hx includes but not limited to anxiety, arthritis, low back pain, HTN, back surgery  Clinical Impression  Pt admitted with above diagnosis. Pt with recent colostomy, prior to that was completely independent without AD use. Since colostomy he has been having difficulty with adls, walking, feels unsteady, and has increased pain.  He lives at a boarding house and has limited daily support, pt and family have asked about SNF placement at d/c. Today, pt reports abdominal pain has decreased some but had significant testicle pain in sitting and reports getting worse since surgery.  Stated he had not told MD, so PT notified PA.  He required min A for transfers and to ambulate 30' with RW.  Pt was unsteady with walking and impulsive with turns requiring min A for balance.  Pt currently with functional limitations due to the deficits listed below (see PT Problem List). Pt will benefit from acute skilled PT to increase their independence and safety with mobility to allow discharge.  Due to unsteadiness, risk of falls, and decreased support Patient will benefit from continued inpatient follow up therapy, <3 hours/day at d/c.          If plan is discharge home, recommend the following: A little help with walking and/or transfers;A little help with bathing/dressing/bathroom;Assistance with cooking/housework;Help with stairs or ramp for entrance   Can travel by private vehicle   Yes    Equipment Recommendations None recommended by PT  Recommendations for Other Services       Functional Status Assessment Patient has had a recent decline in their  functional status and demonstrates the ability to make significant improvements in function in a reasonable and predictable amount of time.     Precautions / Restrictions Precautions Precautions: Fall Precaution Comments: recent colostomy      Mobility  Bed Mobility Overal bed mobility: Needs Assistance Bed Mobility: Supine to Sit, Sit to Supine     Supine to sit: Min assist Sit to supine: Min assist   General bed mobility comments: increased time, min A for trunk    Transfers Overall transfer level: Needs assistance Equipment used: Rolling walker (2 wheels) Transfers: Sit to/from Stand Sit to Stand: Min assist           General transfer comment: Min A to steady with standing    Ambulation/Gait Ambulation/Gait assistance: Editor, commissioning (Feet): 30 Feet Assistive device: Rolling walker (2 wheels) Gait Pattern/deviations: Step-through pattern, Drifts right/left, Trunk flexed Gait velocity: decreased     General Gait Details: Pt impulsive, keeps RW forward, needs cues for RW placement, quick turns (turned RW all the way then caught up), unsteady requiring min A to correct, limited by pain  Stairs            Wheelchair Mobility     Tilt Bed    Modified Rankin (Stroke Patients Only)       Balance Overall balance assessment: Needs assistance Sitting-balance support: No upper extremity supported Sitting balance-Leahy Scale: Good     Standing balance support: Bilateral upper extremity supported Standing balance-Leahy Scale: Poor Standing balance comment: RW and min A  Pertinent Vitals/Pain Pain Assessment Pain Assessment: 0-10 Pain Score: 3  (3/10 rest but increased with sitting) Pain Location: abdomen, testicles, bottom Pain Descriptors / Indicators: Discomfort Pain Intervention(s): Limited activity within patient's tolerance, Monitored during session, Repositioned    Home Living Family/patient  expects to be discharged to:: Private residence Living Arrangements: Alone Available Help at Discharge: Other (Comment) (limited) Type of Home: Other(Comment) (boarding house) Home Access: Stairs to enter Entrance Stairs-Rails: None Entrance Stairs-Number of Steps: 3   Home Layout: One level Home Equipment: Agricultural consultant (2 wheels)      Prior Function               Mobility Comments: Prior to recent ostomy was independent with community ambulation and mobile without AD; since procedure using RW, feels unsteady, difficulty wtih transfers ADLs Comments: Prior to ostomy independent adls and iadls; since procedure Reports difficulty with adls and iadls     Extremity/Trunk Assessment   Upper Extremity Assessment Upper Extremity Assessment: Overall WFL for tasks assessed    Lower Extremity Assessment Lower Extremity Assessment: LLE deficits/detail;RLE deficits/detail RLE Deficits / Details: ROM WFL; MMT at least 3/5 but not further tested as pt with increased pain with sitting LLE Deficits / Details: ROM WFL; MMT at least 3/5 but not further tested as pt with increased pain with sitting    Cervical / Trunk Assessment Cervical / Trunk Assessment: Kyphotic  Communication      Cognition Arousal: Alert Behavior During Therapy: WFL for tasks assessed/performed Overall Cognitive Status: Within Functional Limits for tasks assessed                                          General Comments      Exercises     Assessment/Plan    PT Assessment Patient needs continued PT services  PT Problem List Decreased strength;Decreased range of motion;Decreased activity tolerance;Decreased balance;Decreased mobility;Pain;Decreased knowledge of use of DME;Decreased safety awareness       PT Treatment Interventions DME instruction;Therapeutic exercise;Gait training;Stair training;Functional mobility training;Therapeutic activities;Patient/family  education;Modalities;Balance training    PT Goals (Current goals can be found in the Care Plan section)  Acute Rehab PT Goals Patient Stated Goal: decrease pain; have assist at d/c PT Goal Formulation: With patient Time For Goal Achievement: 03/09/23 Potential to Achieve Goals: Good    Frequency Min 1X/week     Co-evaluation               AM-PAC PT "6 Clicks" Mobility  Outcome Measure Help needed turning from your back to your side while in a flat bed without using bedrails?: A Little Help needed moving from lying on your back to sitting on the side of a flat bed without using bedrails?: A Little Help needed moving to and from a bed to a chair (including a wheelchair)?: A Little Help needed standing up from a chair using your arms (e.g., wheelchair or bedside chair)?: A Little Help needed to walk in hospital room?: A Little Help needed climbing 3-5 steps with a railing? : A Lot 6 Click Score: 17    End of Session Equipment Utilized During Treatment: Gait belt Activity Tolerance: Patient tolerated treatment well Patient left: in bed;with call bell/phone within reach (in ED) Nurse Communication: Mobility status PT Visit Diagnosis: Other abnormalities of gait and mobility (R26.89);Muscle weakness (generalized) (M62.81)    Time: 2536-6440 PT Time Calculation (min) (ACUTE  ONLY): 17 min   Charges:   PT Evaluation $PT Eval Low Complexity: 1 Low   PT General Charges $$ ACUTE PT VISIT: 1 Visit         Anise Salvo, PT Acute Rehab [name] [phone]   Rayetta Humphrey 02/23/2023, 5:27 PM

## 2023-02-23 NOTE — Progress Notes (Signed)
Transition of Care [name]) - Emergency Department Mini Assessment   Patient Details  Name: Todd [name]. MRN: [id number] Date of Birth: 1955/09/28  Transition of Care [facility]) CM/SW Contact:    Princella Ion, LCSW Phone Number: 02/23/2023, 1:59 PM   Clinical Narrative: CSW met with pt at bedside to discuss consult. Pt states he's had cancer since 2011 and is having difficulty managing the ostomy that was placed a week ago. Pt feels he will be a burden to his daughters. He states 3 of them live in [place]. CSW spoke with daughters who are interested in SNF. CSW explained barriers: insurance, permanent address. Daughters verbalized understanding. Daughters would like rehab for pt to receive assistance with ostomy. Daughter states pt has unsteady gait. Daughters encouraged to visit [email or web address] CSW requested PT order by PA.    ED Mini Assessment: What brought you to the Emergency Department? : unable to manage ostomy  Barriers to Discharge: SNF Pending bed offer, ED SNF auth     Means of departure: Not know       Patient Contact and Communications Key Contact 1: Mahmood,patrina (Daughter)  [phone] Key Contact 2: willianson,lecretia (Daughter) [phone] Spoke with: Alvia Grove and Elease Etienne Date: 02/23/23,   Contact time: 0347 Contact Phone Number: [phone] Call outcome: Daughters interested in SNF due to pt being unable to manage  Patient states their goals for this hospitalization and ongoing recovery are:: SNF CMS Medicare.gov Compare Post Acute Care list provided to:: Other (Comment Required) (Adult Children) Choice offered to / list presented to : Adult Children  Admission diagnosis:  abdominal pain Patient Active Problem List   Diagnosis Date Noted   Hx of colonic polyps 03/15/2016   Nausea without vomiting 03/15/2016   Abdominal pain, epigastric 03/15/2016   Lynch syndrome 03/26/2015   Hx of colon cancer, stage I 03/26/2015   LUQ  abdominal pain 03/26/2015   Benign neoplasm of colon 12/04/2013   Diverticulosis of colon 12/04/2013   Hip pain, bilateral 11/28/2013   Periumbilical abdominal pain 11/28/2013   Bladder tumor 10/26/2013   History of colon cancer 08/28/2013   COPD (chronic obstructive pulmonary disease) (HCC) 08/28/2013   GERD (gastroesophageal reflux disease) 08/28/2013   Depression 08/28/2013   Chronic pain syndrome 08/28/2013   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   Testicular pain, right 08/28/2013   PCP:  System, Provider Not In Pharmacy:   [facility] #[id number] [place], [place] - [address] AT [facility] [address] [place] [address] Phone: [phone] Fax: [phone]  [place] [facility] [address] [place] [address] Phone: [phone] Fax: [phone]  [facility] [address] [place], [place] - [address] [place] [address] Phone: [phone] Fax: [phone]  [id number] [place], [place] - [address] AT [facility] [address] [place] [address] Phone: [phone] Fax: [phone]

## 2023-02-23 NOTE — TOC Progression Note (Signed)
Transition of Care (TOC) - Progression Note    Patient Details  Name: Todd [name]. MRN: [id number] Date of Birth: [birth year]  Transition of Care [facility]) CM/SW Contact  Dannielle Karvonen Phone Number: 02/23/2023, 8:45 PM  Clinical Narrative:     CSW spoke with [name], explained official SNF rec, she is agreeable and requests SNF in [place] area. Alvia Grove states they are also interested in LTC when appropriate, CSW explained that process would be handled by SNF. [name] states pt receives $1400 per month in [place]. TOC will continue to follow.      Barriers to Discharge: SNF Pending bed offer, ED SNF auth  Expected Discharge Plan and Services                                               Social Determinants of Health (SDOH) Interventions SDOH Screenings   Food Insecurity: No Food Insecurity (02/08/2023)   Received from [facility]  Transportation Needs: No Transportation Needs (02/08/2023)   Received from [facility]  Financial Resource Strain: Low Risk  (02/08/2023)   Received from [facility]  Physical Activity: Inactive (12/28/2021)   Received from [facility]  Social Connections: Moderately Isolated (12/28/2021)   Received from [facility]  Stress: No Stress Concern Present (12/28/2021)   Received from [facility]  Tobacco Use: High Risk (02/23/2023)  Health Literacy: Low Risk  (12/28/2021)   Received from [facility]    Readmission Risk Interventions     No data to display

## 2023-02-23 NOTE — ED Triage Notes (Signed)
Pt c/o pain around umbilicus x "a while."  Pain score 8/10.  Hx of bladder CA.  Pt reports having bladder removed and ostomy placed on 11/1.  Pt is usually seen at [facility].  Pt's daughter feels like the Pt is unable to manage the ostomy.

## 2023-02-23 NOTE — Discharge Instructions (Addendum)
Please go to the skilled nursing facility as discussed.

## 2023-02-23 NOTE — ED Provider Notes (Signed)
I received this patient at sign out from [employer] PA pending social work consult for SNF placement and PT/OT consult.  67 year old male presented to emergency department for evaluation of abdominal pain he was seen at by his surgeon yesterday without significant concerns.  Family brings him in because they believe that he is unable to care for himself and are interested in SNF placement (see his note).    PT note is significant for unsteadiness with walking and recommends acute skilled PT to increase independence and safety with mobility.  Patient is medically cleared and awaiting SNF placement    Judithann Sheen, PA 02/23/23 1926    Charlynne Pander, MD 02/23/23 [phone]

## 2023-02-24 NOTE — ED Notes (Addendum)
When answer call light pt stated " Why didn't you wake me an empty my ostomy bag" he was informed that he needs to take reponsability for care of his ileostomy. Small leak of urostomy pt denies opening bag but ostomy appears sealed and well approxmated at its edges. Pt got OOB and emptied bag and bed linens changed no other leaks identified.

## 2023-02-24 NOTE — ED Provider Notes (Signed)
Emergency Medicine Observation Re-evaluation Note  Todd [name]. is a 67 y.o. male, seen on rounds today.  Pt initially presented to the ED for complaints of Abdominal Pain and Ostomy Issue Currently, the patient is hemodynamically stable no acute distress.  Physical Exam  BP 123/67 (BP Location: Right Arm)   Pulse 85   Temp 98 F (36.7 C) (Oral)   Resp 17   Ht 5\' 10"  (1.778 m)   Wt 81.2 kg   SpO2 99%   BMI 25.68 kg/m  Physical Exam General: Appears to be resting comfortably in bed, no acute distress. Cardiac: Regular rate, normal heart rate, non-emergent blood pressure for this morning's vitals. Lungs: No increased work of breathing.  Equal chest rise appreciated Psych: Calm, asleep in bed.   ED Course / MDM  EKG:   I have reviewed the labs performed to date as well as medications administered while in observation.    Plan  Current plan is for transitions of care consultation for safe disposition determination.    Glyn Ade, MD 02/24/23 [phone]

## 2023-02-24 NOTE — Progress Notes (Signed)
Physical Therapy Treatment Patient Details Name: Todd [name]. MRN: [id number] DOB: [birth year] Today's Date: 02/24/2023   History of Present Illness Pt is 67 yo male presented to ED on 02/23/23 with concern of abdominal pain and difficulty caring for self at home.  Pt with hx of bladder and colon CA - underwent colostomy placement at [facility] ~ a week ago.  Other hx includes but not limited to anxiety, arthritis, low back pain, HTN, back surgery    PT Comments  Pt seen in ED RM# WA 29. Pt AxO x 3 pleasant and willing.  Assisted to EOB.  Assisted pt with emptying his Urostomy.  Then assisted to bathroom to change cloths.  Required assist to don/doff pants seated.  Assisted with amb in hallway a limited distance.  VERY unsteady gait.  General Gait Details: NO AD this session, pt amb with occassional reach for wall/rail/furniture. Max c/o fatigue.  Limited activity tolerance.   HIGH FALL RISK. Mild c/o ABD pain. RA decreased from 94% to 90% with mild dyspnea during activity.   Pt will need ST Rehab at SNF to address mobility and functional decline prior to safely returning home.    If plan is discharge home, recommend the following: A little help with walking and/or transfers;A little help with bathing/dressing/bathroom;Assistance with cooking/housework;Help with stairs or ramp for entrance   Can travel by private vehicle     Yes  Equipment Recommendations  None recommended by PT    Recommendations for Other Services       Precautions / Restrictions Precautions Precautions: Fall Precaution Comments: recent ostomy Restrictions Weight Bearing Restrictions: No     Mobility  Bed Mobility Overal bed mobility: Modified Independent             General bed mobility comments: increased time    Transfers Overall transfer level: Needs assistance Equipment used: None Transfers: Sit to/from Stand Sit to Stand: Contact guard assist, Min assist           General transfer comment:  assist to steady and VC's for direction.  Also assisted with a toilet transfer.    Ambulation/Gait Ambulation/Gait assistance: Contact guard assist, Min assist Gait Distance (Feet): 45 Feet Assistive device: None Gait Pattern/deviations: Step-through pattern, Drifts right/left, Trunk flexed Gait velocity: decreased     General Gait Details: NO AD this session, pt amb a functional distance with occassional reach for wall/rail/furniture.  Slow but steady.  Mild c/o ABD pain.   Stairs             Wheelchair Mobility     Tilt Bed    Modified Rankin (Stroke Patients Only)       Balance                                            Cognition Arousal: Alert Behavior During Therapy: WFL for tasks assessed/performed Overall Cognitive Status: Within Functional Limits for tasks assessed Area of Impairment: Orientation                               General Comments: AxO x 3 pleasant and willing        Exercises      General Comments        Pertinent Vitals/Pain Pain Assessment Pain Assessment: 0-10 Pain Score: 4  Pain Location: abdomen,  testicles, bottom Pain Descriptors / Indicators: Discomfort, Moaning, Guarding, Grimacing Pain Intervention(s): Monitored during session, Repositioned    Home Living                          Prior Function            PT Goals (current goals can now be found in the care plan section) Progress towards PT goals: Progressing toward goals    Frequency    Min 1X/week      PT Plan      Co-evaluation              AM-PAC PT "6 Clicks" Mobility   Outcome Measure  Help needed turning from your back to your side while in a flat bed without using bedrails?: A Little Help needed moving from lying on your back to sitting on the side of a flat bed without using bedrails?: A Little Help needed moving to and from a bed to a chair (including a wheelchair)?: A Little Help needed  standing up from a chair using your arms (e.g., wheelchair or bedside chair)?: A Little Help needed to walk in hospital room?: A Little Help needed climbing 3-5 steps with a railing? : A Lot 6 Click Score: 17    End of Session Equipment Utilized During Treatment: Gait belt Activity Tolerance: Patient tolerated treatment well Patient left: in bed;with call bell/phone within reach Nurse Communication: Mobility status;Patient requests pain meds PT Visit Diagnosis: Other abnormalities of gait and mobility (R26.89);Muscle weakness (generalized) (M62.81)     Time: 1355-1420 PT Time Calculation (min) (ACUTE ONLY): 25 min  Charges:    $Gait Training: 8-22 mins $Therapeutic Activity: 8-22 mins PT General Charges $$ ACUTE PT VISIT: 1 Visit                     Felecia Shelling  PTA Acute  Rehabilitation Services Office M-F          [phone]

## 2023-02-24 NOTE — ED Notes (Signed)
Patient doing well today, appears to be happy today. Reports that he was upset yesterday due to not knowing why he was here. Doing well ambulated to bathroom with no assistance.

## 2023-02-24 NOTE — NC FL2 (Signed)
[place] MEDICAID FL2 LEVEL OF CARE FORM     IDENTIFICATION  Patient Name: Todd [name]. Birthdate: [birth year] Sex: male Admission Date (Current Location): 02/23/2023  [facility] and [place] Number:  Producer, television/film/video and Address:  [facility],  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   Hx of colon cancer, stage I 03/26/2015   LUQ abdominal pain 03/26/2015   Benign neoplasm of colon 12/04/2013   Diverticulosis of colon 12/04/2013   Hip pain, bilateral 11/28/2013   Periumbilical abdominal pain 11/28/2013   Bladder tumor 10/26/2013   History of colon cancer 08/28/2013   COPD (chronic obstructive pulmonary disease) (HCC) 08/28/2013   GERD (gastroesophageal reflux disease) 08/28/2013   Depression 08/28/2013   Chronic pain syndrome 08/28/2013   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   Testicular pain, right 08/28/2013    Orientation RESPIRATION BLADDER Height & Weight     Self, Time, Situation  Normal Urostomy (Single ostomy with urine and feces) Weight: 179 lb (81.2 kg) Height:  5\' 10"  (177.8 cm)  BEHAVIORAL SYMPTOMS/MOOD NEUROLOGICAL BOWEL NUTRITION STATUS      Ileostomy (Single ostomy with urine and feces) Diet (Regular)  AMBULATORY STATUS COMMUNICATION OF NEEDS Skin   Limited Assist Verbally Normal                       Personal Care Assistance Level of Assistance  Bathing, Feeding, Dressing Bathing  Assistance: Limited assistance Feeding assistance: Independent Dressing Assistance: Limited assistance     Functional Limitations Info  Sight, Hearing, Speech Sight Info: Adequate Hearing Info: Adequate Speech Info: Adequate    SPECIAL CARE FACTORS FREQUENCY  PT (By licensed PT), OT (By licensed OT)     PT Frequency: 5x week OT Frequency: 5x week            Contractures Contractures Info: Not present    Additional Factors Info  Code Status, Allergies, Psychotropic Code Status Info: Full Allergies Info: Clindamycin/lincomycin  Milk-related Compounds  [place]  Hydrocodone Psychotropic Info: gabapentin (NEURONTIN) capsule 100 mg 3x daily         Current Medications (02/24/2023):  This is the current hospital active medication list Current Facility-Administered Medications  Medication Dose Route Frequency Provider Last Rate Last Admin   clonazePAM (KLONOPIN) tablet 1 mg  1 mg Oral TID PRN Charlynne Pander, MD       gabapentin (NEURONTIN) capsule 100 mg  100 mg Oral TID Charlynne Pander, MD   100 mg at 02/23/23 2213   irbesartan (AVAPRO) tablet 150 mg  150 mg Oral Daily Charlynne Pander, MD       And   hydrochlorothiazide (HYDRODIURIL) tablet 12.5 mg  12.5 mg Oral Daily Charlynne Pander, MD       oxyCODONE (Oxy IR/ROXICODONE) immediate release tablet 15 mg  15 mg Oral Q6H PRN Charlynne Pander, MD       Current Outpatient  Medications  Medication Sig Dispense Refill   albuterol (PROVENTIL HFA;VENTOLIN HFA) 108 (90 BASE) MCG/ACT inhaler Inhale 1 puff into the lungs every 6 (six) hours as needed for wheezing or shortness of breath.     budesonide-formoterol (SYMBICORT) 160-4.5 MCG/ACT inhaler Inhale 2 puffs into the lungs 2 (two) times daily as needed (for respiratory flares).     clonazePAM (KLONOPIN) 1 MG tablet Take 1 mg by mouth 3 (three) times daily as needed for anxiety.  0   gabapentin (NEURONTIN) 100 MG capsule Take 100 mg by mouth 3 (three) times daily.      Ibuprofen (ADVIL) 200 MG CAPS Take 200-800 mg by mouth every 6 (six) hours as needed (for pain or headaches).     oxyCODONE (ROXICODONE) 15 MG immediate release tablet Take 15 mg by mouth every 6 (six) hours as needed for pain.     pantoprazole (PROTONIX) 40 MG tablet Take 1 tablet (40 mg total) by mouth 2 (two) times daily. (Patient taking differently: Take 40 mg by mouth daily as needed (for reflux).) 180 tablet 0   valsartan-hydrochlorothiazide (DIOVAN-HCT) 160-12.5 MG tablet Take 1 tablet by mouth daily.     Na Sulfate-K Sulfate-Mg Sulf 17.5-3.13-1.6 GM/177ML SOLN Suprep (no substitutions)-TAKE AS DIRECTED. (Patient not taking: Reported on 02/23/2023) 354 mL 0     Discharge Medications: Please see discharge summary for a list of discharge medications.  Relevant Imaging Results:  Relevant Lab Results:   Additional Information SSN [id number], has ostomy  Princella Ion, LCSW

## 2023-02-24 NOTE — ED Notes (Addendum)
States that he is not suicidal and is unsure of why he is here

## 2023-02-24 NOTE — Evaluation (Signed)
Occupational Therapy Evaluation Patient Details Name: Todd [name]. MRN: [id number] DOB: 1955/07/17 Today's Date: 02/24/2023   History of Present Illness Pt is 67 yo male presented to ED on 02/23/23 with concern of abdominal pain and difficulty caring for self at home.  Pt with hx of bladder and colon CA - underwent colostomy placement at [facility] ~ a week ago.  Other hx includes but not limited to anxiety, arthritis, low back pain, HTN, back surgery   Clinical Impression   Patient is currently requiring assistance with ADLs including up to moderate assist with Lower body ADLs, up to minimal assist with Upper body ADLs,  no assist with bed mobility but minimal  assist with functional transfers to toilet.  Current level of function is below patient's typical baseline.   During this evaluation, patient was limited by generalized weakness, impaired activity tolerance, and primarily pain to testicles and abdomen, all of which has the potential to impact patient's safety and independence during functional mobility, as well as performance for ADLs.  Patient lives in a group home with little to no support available. Pt has 5 daughters in [place] but all work.   Patient demonstrates good rehab potential, and should benefit from continued skilled occupational therapy services while in acute care to maximize safety, independence and quality of life at home.  Continued occupational therapy services at a skilled nursing facility are recommended.  ?        If plan is discharge home, recommend the following: A little help with walking and/or transfers;A little help with bathing/dressing/bathroom;Assistance with cooking/housework;Assist for transportation;Direct supervision/assist for financial management;Direct supervision/assist for medications management    Functional Status Assessment  Patient has had a recent decline in their functional status and demonstrates the ability to make significant improvements in  function in a reasonable and predictable amount of time.  Equipment Recommendations   (TBD depending on discharge destination.)    Recommendations for Other Services       Precautions / Restrictions Precautions Precautions: Fall Precaution Comments: recent colostomy-keep belt high. New Suicide Precautions Restrictions Weight Bearing Restrictions: No      Mobility Bed Mobility Overal bed mobility: Modified Independent                  Transfers                          Balance Overall balance assessment: Needs assistance Sitting-balance support: No upper extremity supported Sitting balance-Leahy Scale: Good Sitting balance - Comments: Poor tolerance due to peri pain   Standing balance support: No upper extremity supported Standing balance-Leahy Scale: Poor Standing balance comment: Unsteady                           ADL either performed or assessed with clinical judgement   ADL Overall ADL's : Needs assistance/impaired Eating/Feeding: Independent   Grooming: Standing;Wash/dry hands;Contact guard assist   Upper Body Bathing: Minimal assistance;Sitting   Lower Body Bathing: Moderate assistance;Sitting/lateral leans;Sit to/from stand   Upper Body Dressing : Minimal assistance;Sitting   Lower Body Dressing: Moderate assistance;Sitting/lateral leans Lower Body Dressing Details (indicate cue type and reason): Pt able to bring one leg onto bed to demonstrate ability to don 1 sock.  After this however patient with increased pain and fatigue therefore OT donned second sock. Toilet Transfer: Minimal assistance;Ambulation;Regular Toilet   Toileting- Clothing Manipulation and Hygiene: Minimal assistance  Functional mobility during ADLs: Contact guard assist;Minimal assistance General ADL Comments: Patient has to walk with a very wide base of support similar to a waddle to avoid his legs touching his testicles which patient reports are very  tender.  While in bathroom patient's PERI area was assessed did not notice any unusual amount of swelling, and patient repeating extreme tenderness.     Vision   Vision Assessment?: No apparent visual deficits     Perception         Praxis         Pertinent Vitals/Pain Pain Assessment Pain Assessment: 0-10 Pain Score: 7  Pain Location: abdomen, testicles, bottom Pain Descriptors / Indicators: Discomfort, Moaning, Guarding, Grimacing Pain Intervention(s): Limited activity within patient's tolerance, Monitored during session, Repositioned, Patient requesting pain meds-RN notified     Extremity/Trunk Assessment Upper Extremity Assessment Upper Extremity Assessment: Generalized weakness (Elbow-> Grip WFL. Shoulders give out quickly with light resistance with increased ABD pain.)   Lower Extremity Assessment Lower Extremity Assessment: Defer to PT evaluation   Cervical / Trunk Assessment Cervical / Trunk Assessment: Kyphotic   Communication Communication Communication: No apparent difficulties   Cognition Arousal: Alert Behavior During Therapy: WFL for tasks assessed/performed Overall Cognitive Status: Impaired/Different from baseline Area of Impairment: Orientation                 Orientation Level: Place, Disoriented to ("Where am I?")                   General Comments       Exercises     Shoulder Instructions      Home Living Family/patient expects to be discharged to:: Private residence Living Arrangements: Alone Available Help at Discharge: Other (Comment) (Limited support) Type of Home: Other(Comment) (Group home) Home Access: Stairs to enter Entrance Stairs-Number of Steps: 3 Entrance Stairs-Rails: None Home Layout: One level     Bathroom Shower/Tub: Tub/shower unit         Home Equipment: Agricultural consultant (2 wheels)   Additional Comments: Patient reports that he lives in [place], [place] however all of his family are in [place].   He had originally planned on moving in with his daughter, but does not think she can care for him and keep her job at the same time.      Prior Functioning/Environment               Mobility Comments: Prior to recent ostomy was independent with community ambulation and mobile without AD; since procedure using RW, feels unsteady, difficulty wtih transfers ADLs Comments: Prior to ostomy independent adls and iadls; since procedure Reports difficulty with adls and iadls        OT Problem List: Pain;Decreased strength;Decreased activity tolerance;Decreased knowledge of use of DME or AE;Impaired balance (sitting and/or standing)      OT Treatment/Interventions: Self-care/ADL training;Therapeutic activities;Patient/family education;Energy conservation;DME and/or AE instruction;Balance training    OT Goals(Current goals can be found in the care plan section) Acute Rehab OT Goals Patient Stated Goal: Pain relief OT Goal Formulation: With patient Time For Goal Achievement: 03/10/23 Potential to Achieve Goals: Good ADL Goals Pt Will Perform Grooming: with modified independence;standing Pt Will Perform Lower Body Dressing: with modified independence;with adaptive equipment;sitting/lateral leans;sit to/from stand Pt Will Transfer to Toilet: with modified independence;ambulating Pt Will Perform Toileting - Clothing Manipulation and hygiene: with modified independence;sitting/lateral leans;sit to/from stand Additional ADL Goal #1: Patient will identify at least 3 energy conservation strategies to employ at home  in order to maximize function and quality of life and decrease caregiver burden while preventing exacerbation of symptoms and rehospitalization.  OT Frequency: Min 1X/week    Co-evaluation              AM-PAC OT "6 Clicks" Daily Activity     Outcome Measure Help from another person eating meals?: None Help from another person taking care of personal grooming?: A Little Help from  another person toileting, which includes using toliet, bedpan, or urinal?: A Little Help from another person bathing (including washing, rinsing, drying)?: A Little Help from another person to put on and taking off regular upper body clothing?: A Little Help from another person to put on and taking off regular lower body clothing?: A Lot 6 Click Score: 18   End of Session Equipment Utilized During Treatment: Gait belt Nurse Communication: Patient requests pain meds;Other (comment) (And secure chat to Attending re: testicle pain)  Activity Tolerance: Patient limited by pain Patient left: in bed  OT Visit Diagnosis: Unsteadiness on feet (R26.81);Pain Pain - part of body:  (ABD and testicles)                Time: 4540-9811 OT Time Calculation (min): 20 min Charges:  OT General Charges $OT Visit: 1 Visit OT Evaluation $OT Eval Low Complexity: 1 Low  Allisa Einspahr, OT Acute Rehab Services Office: [phone] 02/24/2023   Theodoro Clock 02/24/2023, 9:59 AM

## 2023-02-24 NOTE — Progress Notes (Addendum)
CSW presented bed offers to pt's daughters. They have chosen [facility] at this time. Daughters would like to work with SNF to transition pt into LTC. CSW notified Whitney with Central Intake.   Addend @ 3:00 PM Daughter requested HCPOA paperwork be left in pt's room. CSW paged On Call Chaplain.   Pt can admit over the weekend if Auth is received.

## 2023-02-25 MED ORDER — OXYCODONE HCL 15 MG PO TABS: 15.0000 mg | ORAL_TABLET | Freq: Four times a day (QID) | ORAL | 0 refills | Status: DC | PRN

## 2023-02-25 MED ORDER — POLYETHYLENE GLYCOL 3350 17 G PO PACK
17.0000 g | PACK | Freq: Every day | ORAL | Status: DC
  Administered 2023-02-25: 17 g via ORAL
  Filled 2023-02-25: qty 1

## 2023-02-25 MED ORDER — ALUM & MAG HYDROXIDE-SIMETH 200-200-20 MG/5ML PO SUSP
30.0000 mL | Freq: Once | ORAL | Status: AC
  Administered 2023-02-25: 30 mL via ORAL
  Filled 2023-02-25: qty 30

## 2023-02-25 NOTE — ED Provider Notes (Addendum)
Emergency Medicine Observation Re-evaluation Note  Todd [name]. is a 67 y.o. male, seen on rounds today.  Pt initially presented to the ED for complaints of Abdominal Pain and Ostomy Issue Currently, the patient is resting comfortably.  Physical Exam  BP 130/87 (BP Location: Left Arm)   Pulse 82   Temp 98.5 F (36.9 C) (Oral)   Resp 20   Ht 5\' 10"  (1.778 m)   Wt 81.2 kg   SpO2 99%   BMI 25.68 kg/m  Physical Exam General: No acute distress Cardiac: Regular rate Lungs: No respiratory distress noted   ED Course / MDM  EKG:   I have reviewed the labs performed to date as well as medications administered while in observation.  Recent changes in the last 24 hours include no acute events overnight.  Plan  Current plan is for hopeful discharge to skilled nursing facility today as the patient's family has picked in a skilled nursing facility as of yesterday.  Appreciate transitions of care team on work for safe disposition.    Durwin Glaze, MD 02/25/23 [phone]  The patient is being discharged to go to [facility] for rehabilitation.    Durwin Glaze, MD 02/25/23 [phone]

## 2023-02-25 NOTE — Progress Notes (Signed)
CSW contacted [facility]. Patients insurance authorization is still under review

## 2023-02-25 NOTE — Progress Notes (Signed)
Mobility Specialist - Progress Note   02/25/23 1117  Mobility  Activity Ambulated with assistance in hallway  Level of Assistance Contact guard assist, steadying assist  Assistive Device None  Distance Ambulated (ft) 80 ft  Activity Response Tolerated well  Mobility Referral Yes  $Mobility charge 1 Mobility  Mobility Specialist Start Time (ACUTE ONLY) 1101  Mobility Specialist Stop Time (ACUTE ONLY) 1112  Mobility Specialist Time Calculation (min) (ACUTE ONLY) 11 min   Pt received in bed and agreeable to mobility. Pt declined use of walker. Once standing, pt had x1 instance of LOB that was self corrected. No complaints during session. Upon returning to room, pt requested assistance to the bathroom. Pt to bed after session with all needs met.    [facility]

## 2023-02-26 LAB — URINE CULTURE: Culture: >=100000 — AB

## 2023-02-27 ENCOUNTER — Telehealth (HOSPITAL_BASED_OUTPATIENT_CLINIC_OR_DEPARTMENT_OTHER): Payer: Self-pay | Admitting: *Deleted

## 2023-02-27 NOTE — Progress Notes (Signed)
ED Antimicrobial Stewardship Positive Culture Follow Up   Todd [name]. is an 67 y.o. male who presented to [facility] on 02/23/2023 with a chief complaint of  Chief Complaint  Patient presents with   Abdominal Pain   Ostomy Issue    Recent Results (from the past 720 hour(s))  Urine Culture     Status: Abnormal   Collection Time: 02/23/23  4:49 PM   Specimen: Urine, Random  Result Value Ref Range Status   Specimen Description   Final    URINE, RANDOM Performed at [facility], [address], [place], [place] [address]    Special Requests   Final    NONE Reflexed from [id number] Performed at [facility], [address] [place], [place] [address]    Culture (A)  Final    >=100,000 COLONIES/mL METHICILLIN RESISTANT STAPHYLOCOCCUS AUREUS   Report Status 02/26/2023 FINAL  Final   Organism ID, Bacteria METHICILLIN RESISTANT STAPHYLOCOCCUS AUREUS (A)  Final      Susceptibility   Methicillin resistant staphylococcus aureus - MIC*    CIPROFLOXACIN >=8 RESISTANT Resistant     GENTAMICIN <=0.5 SENSITIVE Sensitive     NITROFURANTOIN <=16 SENSITIVE Sensitive     OXACILLIN >=4 RESISTANT Resistant     TETRACYCLINE <=1 SENSITIVE Sensitive     VANCOMYCIN 1 SENSITIVE Sensitive     TRIMETH/SULFA >=320 RESISTANT Resistant     CLINDAMYCIN <=0.25 SENSITIVE Sensitive     RIFAMPIN <=0.5 SENSITIVE Sensitive     Inducible Clindamycin NEGATIVE Sensitive     LINEZOLID 2 SENSITIVE Sensitive     * >=100,000 COLONIES/mL METHICILLIN RESISTANT STAPHYLOCOCCUS AUREUS    []  Treated with , organism resistant to prescribed antimicrobial [x]  Patient discharged originally without antimicrobial agent.  11/17 Discussed culture positive, but asymptomatic patient with ED provider.  He recommends symptom check and only treat if patient is currently having urinary tract symptoms.  If symptomatic, then treat with Linezolid for MRSA.  New antibiotic prescription: Linezolid  600mg  PO BID x 7 days.   ED Provider: Renne Crigler PA-C   Lynann Beaver PharmD, BCPS [facility] main pharmacy [phone] 02/27/2023 1:13 PM

## 2023-02-27 NOTE — Telephone Encounter (Signed)
Post ED Visit - Positive Culture Follow-up  Culture report reviewed by antimicrobial stewardship pharmacist: [facility] []  Enzo Bi, Pharm.D. []  Celedonio Miyamoto, Pharm.D., BCPS AQ-ID []  Garvin Fila, Pharm.D., BCPS []  Georgina Pillion, Pharm.D., BCPS []  [place], [place].D., BCPS, AAHIVP []  Estella Husk, Pharm.D., BCPS, AAHIVP []  Lysle Pearl, PharmD, BCPS []  Phillips Climes, PharmD, BCPS []  Agapito Games, PharmD, BCPS []  Verlan Friends, PharmD []  Mervyn Gay, PharmD, BCPS []  Vinnie Level, PharmD  [facility] []  Len Childs, PharmD []  Greer Pickerel, PharmD []  Adalberto Cole, PharmD []  Perlie Gold, Rph []  Lonell Face) Jean Rosenthal, PharmD []  Earl Many, PharmD []  Junita Push, PharmD []  Dorna Leitz, PharmD []  Terrilee Files, PharmD [x]  Lynann Beaver, PharmD []  Keturah Barre, PharmD []  Loralee Pacas, PharmD []  Bernadene Person, PharmD   Positive urine culture Plan: Call for symptom check. Only treat if having urinary symptoms. If treatment needed, Linezolid 600mg  PO BID x 7 days per Renne Crigler, PA-C Pt currently at [facility], [place]. Spoke with facility. Faxed culture report   Patsey Berthold 02/27/2023, 3:20 PM

## 2023-03-01 ENCOUNTER — Encounter (HOSPITAL_COMMUNITY): Payer: Self-pay

## 2023-03-03 ENCOUNTER — Inpatient Hospital Stay: Payer: 59

## 2023-03-03 VITALS — BP 116/58 | HR 66 | Temp 97.7°F | Resp 17 | Wt 172.6 lb

## 2023-03-03 DIAGNOSIS — K5900 Constipation, unspecified: Secondary | ICD-10-CM

## 2023-03-03 DIAGNOSIS — D7589 Other specified diseases of blood and blood-forming organs: Secondary | ICD-10-CM

## 2023-03-03 DIAGNOSIS — C679 Malignant neoplasm of bladder, unspecified: Secondary | ICD-10-CM | POA: Diagnosis not present

## 2023-03-03 DIAGNOSIS — C775 Secondary and unspecified malignant neoplasm of intrapelvic lymph nodes: Secondary | ICD-10-CM | POA: Diagnosis not present

## 2023-03-03 DIAGNOSIS — N289 Disorder of kidney and ureter, unspecified: Secondary | ICD-10-CM

## 2023-03-03 DIAGNOSIS — F17210 Nicotine dependence, cigarettes, uncomplicated: Secondary | ICD-10-CM | POA: Diagnosis not present

## 2023-03-03 DIAGNOSIS — I10 Essential (primary) hypertension: Secondary | ICD-10-CM | POA: Diagnosis not present

## 2023-03-03 DIAGNOSIS — Z85038 Personal history of other malignant neoplasm of large intestine: Secondary | ICD-10-CM

## 2023-03-03 DIAGNOSIS — Z1509 Genetic susceptibility to other malignant neoplasm: Secondary | ICD-10-CM | POA: Diagnosis not present

## 2023-03-03 DIAGNOSIS — Z1506 Genetic susceptibility to malignant neoplasm of urinary tract: Secondary | ICD-10-CM

## 2023-03-03 LAB — CBC WITH DIFFERENTIAL (CANCER CENTER ONLY)
Abs Immature Granulocytes: 0.03 10*3/uL (ref 0.00–0.07)
Basophils Absolute: 0 10*3/uL (ref 0.0–0.1)
Basophils Relative: 1 %
Eosinophils Absolute: 0.3 10*3/uL (ref 0.0–0.5)
Eosinophils Relative: 7 %
HCT: 45.6 % (ref 39.0–52.0)
Hemoglobin: 14.6 g/dL (ref 13.0–17.0)
Immature Granulocytes: 1 %
Lymphocytes Relative: 38 %
Lymphs Abs: 1.8 10*3/uL (ref 0.7–4.0)
MCH: 32.7 pg (ref 26.0–34.0)
MCHC: 32 g/dL (ref 30.0–36.0)
MCV: 102.2 fL — ABNORMAL HIGH (ref 80.0–100.0)
Monocytes Absolute: 0.3 10*3/uL (ref 0.1–1.0)
Monocytes Relative: 7 %
Neutro Abs: 2.2 10*3/uL (ref 1.7–7.7)
Neutrophils Relative %: 46 %
Platelet Count: 272 10*3/uL (ref 150–400)
RBC: 4.46 MIL/uL (ref 4.22–5.81)
RDW: 12.2 % (ref 11.5–15.5)
WBC Count: 4.7 10*3/uL (ref 4.0–10.5)
nRBC: 0 % (ref 0.0–0.2)

## 2023-03-03 LAB — CMP (CANCER CENTER ONLY)
ALT: 7 U/L (ref 0–44)
AST: 10 U/L — ABNORMAL LOW (ref 15–41)
Albumin: 4.1 g/dL (ref 3.5–5.0)
Alkaline Phosphatase: 66 U/L (ref 38–126)
Anion gap: 7 (ref 5–15)
BUN: 30 mg/dL — ABNORMAL HIGH (ref 8–23)
CO2: 25 mmol/L (ref 22–32)
Calcium: 9.2 mg/dL (ref 8.9–10.3)
Chloride: 105 mmol/L (ref 98–111)
Creatinine: 1.99 mg/dL — ABNORMAL HIGH (ref 0.61–1.24)
GFR, Estimated: 36 mL/min — ABNORMAL LOW (ref >60)
Glucose, Bld: 94 mg/dL (ref 70–99)
Potassium: 4.5 mmol/L (ref 3.5–5.1)
Sodium: 137 mmol/L (ref 135–145)
Total Bilirubin: 0.4 mg/dL (ref <1.2)
Total Protein: 7.8 g/dL (ref 6.5–8.1)

## 2023-03-03 LAB — VITAMIN B12: Vitamin B-12: 289 pg/mL (ref 180–914)

## 2023-03-03 LAB — FOLATE: Folate: 6.9 ng/mL (ref >5.9)

## 2023-03-03 NOTE — Patient Instructions (Addendum)
Today we talked about treatment for bladder cancer.  He has stage III bladder cancer.  Chemotherapy can decrease chance of recurrence.  He will need a few more weeks to heal from surgery.  Let's plan on follow-up in about mid December (12/16) to plan on starting chemotherapy.  For constipation, please use Colace twice a day, adding Benefiber in the fluid few times a day.  Drink 60 ounces or more of fluid per day.  He can also add MiraLAX as needed.  If having diarrhea stop the above.  We will schedule a teaching session for chemotherapy before that time.

## 2023-03-03 NOTE — Progress Notes (Signed)
START ON PATHWAY REGIMEN - Bladder     A cycle is every 21 days:     Gemcitabine      Cisplatin   **Always confirm dose/schedule in your pharmacy ordering system**  Patient Characteristics: Post-Cystectomy without Neoadjuvant Therapy, M0 (Pathologic Staging), pT0-4a, pN1, M0 Therapeutic Status: Post-Cystectomy without Neoadjuvant Therapy, M0 (Pathologic Staging) AJCC M Category: cM0 AJCC 8 Stage Grouping: IIIA AJCC T Category: pT3a AJCC N Category: pN1 Intent of Therapy: Curative Intent, Discussed with Patient

## 2023-03-03 NOTE — Assessment & Plan Note (Signed)
09/2011: Surgical resection of transverse colon, T3N0M0. Only 6 LN removed. 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 Completed 2 cycles of adjuvant 5-FU leucovorin, stopped future cycles due to side effects.

## 2023-03-03 NOTE — Assessment & Plan Note (Signed)
Colace twice a daily Adding fiber few times a day Miralax daily

## 2023-03-03 NOTE — Assessment & Plan Note (Addendum)
MLH1 positive Lynch syndrome Recommend testing first degree family

## 2023-03-03 NOTE — Progress Notes (Signed)
[facility] CONSULT NOTE  Patient Care Team: System, Provider Not In as PCP - General  ASSESSMENT & PLAN:  Todd [name] is a 67 y.o.male with history of NMIBC being [facility] for urothelial carcinoma.  Previous history reviewed.  Patient has longstanding history of non-muscle invasive bladder cancer underwent multiple TURBTs since 2015.  He has received BCG, and pembrolizumab in the setting.  Last pembrolizumab was in 09/2021.  PET/CT in October 2024 showed thickening of the right posterolateral bladder wall.  Due to findings of increasing right posterior lateral bladder wall thickening and hydronephrosi suspect higher stage disease patient underwent right radical cystoprostatectomy on 02/07/23 at [facility].  Final pathology showed pT3N1 high-grade urothelial carcinoma invades perivesical soft tissue.  Residual cancer cells occupying greater than or equal to 50% of tumor bed or absence of regressive changes.  All margins negative.  1 left pelvic lymph node positive positive for malignancy.  Margins positive for CIS.  Diagnosis: pT3N1cM0 HG UC. Margin positive for CIS Treatment: CYSTECTOMY, COMPLETE, W/URETEROILEAL CONDUIT/SIGMOID BLADDER & INTEST ANAST; W/BIL PELVIC LYMPHADEN & NODES Midline - PROSTATECTOMY RETROPUBIC RAD W/WO NERV SPARING   We discussed various options at this point in his scenario and that adjuvant therapy can be considered and recommended especially having lymph node positive for carcinoma. Given he did not have neoadjuvant chemotherapy, which improved survival in muscle-invasive bladder cancer I think combination systemic therapy is appropriate.  His performance status is not great, and seems somewhat borderline.  Given this, cisplatin with gemcitabine can be an alternative.  Given the positive lymph node, consider adjuvant radiation is reasonable. A recent randomized phase II trial compared adjuvant sequential chemotherapy and radiation versus adjuvant  chemotherapy alone showed a significant improvement in local control for chemoradiation. JAMA surgery. Zaghloul MS. Et al. [email or web address] I will refer him for evaluation for radiation. Alternatively, adjuvant chemotherapy alone is reasonable as well.  We discussed potential side effects of chemotherapy. Potential side effects of discussed.  After discussion, Gurdon expresses understanding and would like to proceed.   Start cisplatin with gemcitabine if recovering well by 12/16. 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  Addendum I called and spoke to patient's daughter about decreased renal function.  Recommend 60 to 70 mL fluid a day.  Repeat labs in about 2 weeks and reassess.  History of ileal conduit Will check baseline B12 and continue to monitor  History of colon cancer 09/2011: Surgical resection of transverse colon, T3N0M0. Only 6 LN removed. 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 Completed 2 cycles of adjuvant 5-FU leucovorin, stopped future cycles due to side effects.   Lynch syndrome MLH1 positive Lynch syndrome Recommend testing first degree family   Constipation Colace twice a daily Adding fiber few times a day Miralax daily   Orders Placed This Encounter  Procedures   CBC with Differential (Cancer Center Only)    Standing Status:   Future    Number of Occurrences:   1    Standing Expiration Date:   03/02/2024   CMP (Cancer Center only)    Standing Status:   Future    Number of Occurrences:   1    Standing Expiration Date:   03/02/2024   Vitamin B12    Standing Status:   Future  Number of Occurrences:   1    Standing Expiration Date:   03/02/2024   Ferritin    Standing Status:   Future    Number of Occurrences:   1    Standing  Expiration Date:   03/02/2024   Folate    Standing Status:   Future    Number of Occurrences:   1    Standing Expiration Date:   03/02/2024   CBC with Differential (Cancer Center Only)    Standing Status:   Future    Standing Expiration Date:   03/27/2024   CMP (Cancer Center only)    Standing Status:   Future    Standing Expiration Date:   03/27/2024   Magnesium    Standing Status:   Future    Standing Expiration Date:   03/27/2024   CBC with Differential (Cancer Center Only)    Standing Status:   Future    Standing Expiration Date:   04/03/2024   CMP (Cancer Center only)    Standing Status:   Future    Standing Expiration Date:   04/03/2024   All questions were answered. The patient knows to call the clinic with any problems, questions or concerns. No barriers to learning was detected.  [name], MD 11/21/20244:42 PM  CHIEF COMPLAINTS/PURPOSE OF CONSULTATION:  UC  HISTORY OF PRESENTING ILLNESS:  Todd [name]. 67 y.o. male is here because of UC I have reviewed his chart and materials related to his cancer extensively and collaborated history with the patient. Summary of oncologic history is as follows: Oncology History  Urothelial carcinoma of bladder (HCC)  10/26/2013 Initial Diagnosis   Urothelial carcinoma of bladder (HCC) First diagnosed in 2015 with LgTa on TURBT with mitomycin.  2016-2018 He then had multiple other TURBTs with intermittent adherence/tolerability to BCG     05/27/2014 Procedure   Repeat TURBT, path again with LgTa. Only completed 2/6 BCG treatments, then lost to follow-up.    06/09/2015 Procedure   Repeat TURBT with 3 papillary tumors, pathology with LgTa and CIS.    06/2015 Imaging   CT A/P with diffuse bladder wall thickening, no definitive evidence of metastatic disease    07/21/2015 Procedure   Repeat TURBT with path showing high grade dysplasia. Again scheduled for BCG but only completed 1-2 doses.    10/19/2016 Procedure   Repeat  TURBT with low grade papillary urothelial carcinoma/CIS. Received 4/6 doses of BCG. Lost to follow-up.    03/09/2019 Imaging   CT CAP with LUL 2cm GGO, minimal right urinary bladder wall thickening of 4mm    01/22/2020 Imaging   CT A/P in the ED for N/V/D/weight loss. Bladder thickening more pronounced at 7mm.    July 12, 2020 Imaging   CT urogram for hematuria showed new right lateral masslike thickening of bladder wall, measure 8mm. No evidence metastatic disease.    08/13/2020 Procedure   Repeat TURBT with invasive high-grade urothelial carcinoma with nonfocal invasion into lamina propria (CIS present), second specimen with invasive high-grade urothelial carcinoma extensively involving lamina propria, with one fragment showing involvement of muscle bundles which are equivocal for muscularis mucosae or muscularis propria (CIS again present).    09/19/2020 Procedure   Repeat TURBT for re-resection with invasive high-grade urothelial carcinoma with non-focal lamina propria invasion and CIS, no involvement of muscularis propria    11/20/2020 - 09/17/2021 Chemotherapy   C1 - 8 Pembrolizumab    12/05/2022 Imaging   MRI pelvis wo contrast -Increased size of right  posterolateral bladder wall mass, now measuring up to 3.2 cm, previously 1.2 cm, with extramural extension of the mass encasing the distal right ureter and resulting in upstream right ureteral dilation. The extravesicular extension of the bladder mass also is in close contact with the vas deferens near the seminal vesicles for which abutment or invasion cannot be excluded.     01/03/2023 Imaging   CT AP -Increasing right posterior lateral bladder wall thickening, concerning for disease progression. New right ureteral obstruction associated hydroureteronephrosis.  -Multiple subcentimeter hypoattenuating foci within the liver, favored to be benign given stability since 2016.   CT Chest: No intrathoracic metastasis with stable findings since  December 28.     01/17/2023 PET scan   PET from [facility] - Thickening of the right posterolateral bladder wall, similar to prior CT dated 01/03/2023.  - Minimally FDG avid bilateral inguinal lymph nodes which do not appear pathologically enlarged or abnormal in appearance. These are favored to be reactive.  -There is a possible mildly hypermetabolic nodule along the posterior wall of the rectum which is not completely characterized on this study. Suggest correlation with colonoscopy, which patient has scheduled 01/20/2023.    02/07/2023 Pathology Results   A: Ureter margin, right distal, biopsy - Benign ureter with chronic inflammation - Negative for malignancy   B: Ureter margin, left distal, biopsy - Focal atypia, worrisome for focal involvement by urothelial carcinoma in situ   C: Bladder and prostate, cystoprostatectomy   BLADDER - Multifocal invasive high grade papillary urothelial carcinoma              - Dominant tumor is in right posterior upper bladder wall, 3.0 cm, invasive through muscularis propria with macroscopic invasion of peri-vesicle adipose tissue              - Satellite tumor in left bladder extending into left ureter, 0.9 cm, invasive into lamina propria - Extensive multifocal high grade papillary urothelial carcinoma and urothelial carcinoma in situ present in all random sections of the bladder mucosa (anterior, posterior, dome, trigone, right lateral, left lateral walls) away from invasive cancers - 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 (contiguous with carcinoma in situ of trigone), distal margin uninvolved - Margins of resection negative for invasive carcinoma - Left ureter margin of this specimen shows urothelial carcinoma in situ, superceded by additional specimens, final left ureter margin is in specimen F, which shows patchy atypia 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 - See synoptic report    PROSTATE - Urothelial carcinoma in situ involves prostatic  urethra and involves prostatic ducts - Prostatic stroma with no evidence of involvement by urothelial carcinoma, negative for prostate carcinoma - Prostatic urethral distal margin negative   D: Lymph node, right pelvic, biopsy - Five lymph nodes with no evidence of malignancy (0/5)    E: Lymph node, left pelvic, biopsy - Metastatic carcinoma present in one of five lymph nodes (1/5), size of metastatic deposit 1.4 mm, without extranodal extension   F: True ureteral margin, left distal, biopsy - 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 - Negative for invasive carcinoma   G: True ureteral margin, right distal, biopsy - Benign ureter with chronic inflammation - Negative for malignancy  SPECIMEN    Procedure:    Radical cystoprostatectomy   TUMOR    Tumor Site:    Right lateral wall     Tumor Site:    Left lateral wall     Tumor Site:  Posterior wall     Histologic Type:    Urothelial carcinoma, invasive (conventional)     Histologic Grade:    High-grade     Tumor Size:    Greatest Dimension (Centimeters): 3.0 cm       Additional Dimension (Centimeters):    2.9 cm       Additional Dimension (Centimeters):    2.8 cm     Tumor Extent:    Invades perivesical soft tissue microscopically     Lymphatic and / or Vascular Invasion:    Not identified     Tumor Configuration:    Papillary     Tumor Configuration:    Solid / nodule     Tumor Configuration:    Flat     Treatment Effect Post Neoadjuvant Chemotherapy:    Weak and no response: residual cancer cells occupying greater than or equal to 50% of the tumor bed or absence of regressive changes (TRG3)     Tumor Comment:    Appears to have received 1 dose of pembrolizumab, no apparent histologic response.   MARGINS    Margin Status for Invasive Tumor:    All margins negative for invasive tumor       Closest Margin(s) to Invasive tumor:    Soft tissue: right peri-vesicular margin        Distance from Invasive Tumor to Closest Margin:    13 mm     Margin Status for Carcinoma in Situ / Noninvasive Papillary Urothelial Carcinoma:    Carcinoma in situ / noninvasive papillary urothelial carcinoma present at margin       Margin(s) Involved by Carcinoma in Situ / Noninvasive Papillary Urothelial Carcinoma:    Left ureteral   REGIONAL LYMPH NODES     Regional Lymph Node Status:          :    Tumor present in regional lymph node(s)         Number of Lymph Nodes with Tumor:    1         Size of Largest Nodal Metastatic Deposit:    0.14 cm         Nodal Site with Largest Metastatic Deposit:    Left pelvic         Size of Largest Lymph Node with Tumor:    0.7 cm         Largest Lymph Node with Tumor:    left pelvic         Extranodal Extension (ENE):    Not identified       Number of Lymph Nodes Examined:    10   pTNM CLASSIFICATION (AJCC 8th Edition)     Reporting of pT, pN, and (when applicable) pM categories is based on information available to the pathologist at the time the report is issued. As per the AJCC (Chapter 1, 8th Ed.) it is the managing physician's responsibility to establish the final pathologic stage based upon all pertinent information, including but potentially not limited to this pathology report.     Modified Classification:    y     pT Category:    pT3a     T Suffix:    (m)     pN Category:    pN1    02/20/2023 Procedure   Colonoscopy Impression:    - The examined portion of the ileum was normal.                         -  The entire examined colon is normal on direct and retroflexion views.                         - Views were limited by liquid stool throughout the colon and difficulty suctioning the stool.                         - No specimens collected.      MEDICAL HISTORY:  Past Medical History:  Diagnosis Date   Allergy    Anxiety    Arthritis    Asthma    Bladder cancer (HCC) dx'd 10/2013   recurrent   Chronic gastritis    Chronic low back  pain    Colon cancer (HCC) dx'd 09/2011   COPD (chronic obstructive pulmonary disease) (HCC)    Depression    Diverticulosis    Dysuria    GERD (gastroesophageal reflux disease)    History of acute pancreatitis    due to SIRS   History of adenomatous polyp of colon    04-04-2015  last colonoscopy w/ multiple polyps--  tubular adenomas, beign polypoid colonic mucous   History of bladder cancer monitory by  dr Vernie Ammons   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   History of colon cancer oncologist-- dr Truett Perna--  dx  with Hereditary non-polyposis colon cancer syndrome,  MLH1 gene mutation--     dx june 2013--  stage II, T3,N0--  s/p  transverse colectomy--  completed 2 cycles  chemotherapy--  NO RECURRENCE in clinical remission   Hypertension    Lactose intolerance    Lower urinary tract symptoms (LUTS)    Lynch syndrome    MLH1 gene mutation    Short of breath on exertion    Wears glasses     SURGICAL HISTORY: Past Surgical History:  Procedure Laterality Date   CHOLECYSTECTOMY  2013   COLON SURGERY  06/ 2013   Transverse   COLONOSCOPY N/A 12/04/2013   Procedure: COLONOSCOPY;  Surgeon: Louis Meckel, MD;  Location: [facility];  Service: Endoscopy;  Laterality: N/A;   COLONOSCOPY WITH ESOPHAGOGASTRODUODENOSCOPY (EGD)  last one 04-04-2015   polypecotmy's gastric and colon    CYSTOSCOPY W/ RETROGRADES Bilateral 06/06/2015   Procedure: BILATERAL  RETROGRADE PYELOGRAM;  Surgeon: Ihor Gully, MD;  Location: [facility];  Service: Urology;  Laterality: Bilateral;   ERCP N/A 01/01/2014   Procedure: ENDOSCOPIC RETROGRADE CHOLANGIOPANCREATOGRAPHY (ERCP);  Surgeon: Louis Meckel, MD;  Location: [facility];  Service: Endoscopy;  Laterality: N/A;  with spyglass - preprocedure ATB's   LUMBAR DISC SURGERY  x4   last one 2001   fusion L5 -- S1   SPYGLASS CHOLANGIOSCOPY N/A 01/01/2014   Procedure: SPYGLASS CHOLANGIOSCOPY;  Surgeon: Louis Meckel, MD;  Location:  [facility];  Service: Endoscopy;  Laterality: N/A;   TONSILLECTOMY  age 18   TRANSURETHRAL RESECTION OF BLADDER TUMOR WITH GYRUS (TURBT-GYRUS) N/A 10/26/2013   Procedure: TRANSURETHRAL RESECTION OF BLADDER TUMOR WITH GYRUS (TURBT-GYRUS) ;  Surgeon: Garnett Farm, MD;  Location: [facility];  Service: Urology;  Laterality: N/A;   TRANSURETHRAL RESECTION OF BLADDER TUMOR WITH GYRUS (TURBT-GYRUS) N/A 05/27/2014   Procedure: TRANSURETHRAL RESECTION OF BLADDER TUMOR  AND TRANSURETHERAL BIOPSY OF PROSTATE;  Surgeon: Garnett Farm, MD;  Location: [facility];  Service: Urology;  Laterality: N/A;   TRANSURETHRAL RESECTION OF BLADDER TUMOR WITH GYRUS (TURBT-GYRUS) N/A 06/06/2015   Procedure: TRANSURETHRAL  RESECTION OF BLADDER TUMOR WITH GYRUS (TURBT-GYRUS);  Surgeon: Ihor Gully, MD;  Location: [facility];  Service: Urology;  Laterality: N/A;    SOCIAL HISTORY: Social History   Socioeconomic History   Marital status: Single    Spouse name: Not on file   Number of children: 6   Years of education: Not on file   Highest education level: Not on file  Occupational History   Occupation: disabled  Tobacco Use   Smoking status: Every Day    Current packs/day: 0.50    Average packs/day: 0.5 packs/day for 20.0 years (10.0 ttl pk-yrs)    Types: Cigarettes, E-cigarettes   Smokeless tobacco: Never   Tobacco comments:    hx 1PPD smoker quit jan 2015 for 20 yrs.  Pt has cut back to 1 pk /wk  Vaping Use   Vaping status: Never Used  Substance and Sexual Activity   Alcohol use: No   Drug use: No   Sexual activity: Not on file  Other Topics Concern   Not on file  Social History Narrative   ** Merged History Encounter **       Social Determinants of Health   Financial Resource Strain: Low Risk  (02/08/2023)   Received from [facility]   Overall Financial Resource Strain (CARDIA)    Difficulty of Paying Living Expenses: Not very hard  Food Insecurity: No Food  Insecurity (02/08/2023)   Received from [facility]   Hunger Vital Sign    Worried About Running Out of Food in the Last Year: Never true    Ran Out of Food in the Last Year: Never true  Transportation Needs: No Transportation Needs (02/08/2023)   Received from [facility] - Transportation    Lack of Transportation (Medical): No    Lack of Transportation (Non-Medical): No  Physical Activity: Inactive (12/28/2021)   Received from [facility]   Exercise Vital Sign    Days of Exercise per Week: 0 days    Minutes of Exercise per Session: 0 min  Stress: No Stress Concern Present (12/28/2021)   Received from [facility]ational [facility] - Occupational Stress Questionnaire    Feeling of Stress : Only a little  Social Connections: Moderately Isolated (12/28/2021)   Received from [facility]   Social Connection and Isolation Panel [NHANES]    Frequency of Communication with Friends and Family: More than three times a week    Frequency of Social Gatherings with Friends and Family: More than three times a week    Attends Religious Services: Never    Database administrator or Organizations: Yes    Attends Banker Meetings: Never    Marital Status: Divorced  Catering manager Violence: Not At Risk (12/28/2021)   Received from [facility]   Humiliation, Afraid, Rape, and Kick questionnaire    Fear of Current or Ex-Partner: No    Emotionally Abused: No    Physically Abused: No    Sexually Abused: No    FAMILY HISTORY: Family History  Problem Relation Age of Onset   Colon cancer Brother        x2   Rectal cancer Brother    Colon cancer Sister    Colon cancer Maternal Uncle    Colon cancer Cousin        x 2   Breast cancer Maternal Grandmother    Colon polyps Daughter  x 3   Diabetes Father    Stroke Father    Esophageal cancer Neg Hx    Liver cancer Neg Hx    Pancreatic cancer Neg Hx    Stomach cancer Neg  Hx     ALLERGIES:  is allergic to clindamycin/lincomycin, milk-related compounds, and hydrocodone.  MEDICATIONS:  Current Outpatient Medications  Medication Sig Dispense Refill   albuterol (PROVENTIL HFA;VENTOLIN HFA) 108 (90 BASE) MCG/ACT inhaler Inhale 1 puff into the lungs every 6 (six) hours as needed for wheezing or shortness of breath.     budesonide-formoterol (SYMBICORT) 160-4.5 MCG/ACT inhaler Inhale 2 puffs into the lungs 2 (two) times daily as needed (for respiratory flares).     gabapentin (NEURONTIN) 100 MG capsule Take 100 mg by mouth 3 (three) times daily.     Ibuprofen (ADVIL) 200 MG CAPS Take 200-800 mg by mouth every 6 (six) hours as needed (for pain or headaches).     Na Sulfate-K Sulfate-Mg Sulf 17.5-3.13-1.6 GM/177ML SOLN Suprep (no substitutions)-TAKE AS DIRECTED. 354 mL 0   oxyCODONE (ROXICODONE) 15 MG immediate release tablet Take 15 mg by mouth every 6 (six) hours as needed for pain.     oxyCODONE (ROXICODONE) 15 MG immediate release tablet Take 1 tablet (15 mg total) by mouth every 6 (six) hours as needed for pain. 30 tablet 0   pantoprazole (PROTONIX) 40 MG tablet Take 1 tablet (40 mg total) by mouth 2 (two) times daily. (Patient taking differently: Take 40 mg by mouth daily as needed (for reflux).) 180 tablet 0   valsartan-hydrochlorothiazide (DIOVAN-HCT) 160-12.5 MG tablet Take 1 tablet by mouth daily.     clonazePAM (KLONOPIN) 1 MG tablet Take 1 mg by mouth 3 (three) times daily as needed for anxiety. (Patient not taking: Reported on 03/03/2023)  0   No current facility-administered medications for this visit.    REVIEW OF SYSTEMS:   Constitutional: +weight loss Respiratory: Denies cough, shortness of breath  Cardiovascular: Denies palpitation, chest discomfort or lower extremity swelling Gastrointestinal:  Denies nausea, vomiting, abdominal pain, diarrhea, +constipation GU: Denies any dysuria, hematuria Skin: Denies abnormal skin rashes Lymphatics: Denies  new lymphadenopathy or easy bruising Neurological: Denies numbness, tingling or new weaknesses Behavioral/Psych: Mood is stable, no new changes  All other systems were reviewed with the patient and are negative.  PHYSICAL EXAMINATION: ECOG PERFORMANCE STATUS: 2 - Symptomatic, <50% confined to bed  Vitals:   03/03/23 1450  BP: (!) 116/58  Pulse: 66  Resp: 17  Temp: 97.7 F (36.5 C)  SpO2: 100%   Filed Weights   03/03/23 1450  Weight: 172 lb 9.6 oz (78.3 kg)    GENERAL: alert, no distress and comfortable SKIN: skin color is normal, no jaundice, rashes or significant lesions EYES: sclera clear OROPHARYNX: no exudate, no erythema NECK: supple LYMPH:  no palpable lymphadenopathy in the cervical, axillary regions LUNGS: Effort normal, no respiratory distress.  Clear to auscultation bilaterally HEART: regular rate & rhythm and no lower extremity edema ABDOMEN: soft, non-tender and nondistended Ostomy in place from ileal conduit clear urine   LABORATORY DATA:  I have reviewed the data as listed Lab Results  Component Value Date   WBC 4.7 03/03/2023   HGB 14.6 03/03/2023   HCT 45.6 03/03/2023   MCV 102.2 (H) 03/03/2023   PLT 272 03/03/2023   Recent Labs    02/23/23 1318  NA 137  K 4.0  CL 107  CO2 20*  GLUCOSE 95  BUN 24*  CREATININE 1.65*  CALCIUM 8.5*  GFRNONAA 46*  PROT 6.9  ALBUMIN 3.4*  AST 14*  ALT 8  ALKPHOS 50  BILITOT 0.6    RADIOGRAPHIC STUDIES: I have personally reviewed the radiological images as listed and agreed with the findings in the report. CT ABDOMEN PELVIS W CONTRAST  Result Date: 02/23/2023 CLINICAL DATA:  Bladder carcinoma. Recent cystectomy ileal conduit formation. Additional history of colon cancer with colectomy. * Tracking Code: BO * acute nonlocalized abdominal pain. EXAM: CT ABDOMEN AND PELVIS WITH CONTRAST TECHNIQUE: Multidetector CT imaging of the abdomen and pelvis was performed using the standard protocol following bolus  administration of intravenous contrast. RADIATION DOSE REDUCTION: This exam was performed according to the departmental dose-optimization program which includes automated exposure control, adjustment of the mA and/or kV according to patient size and/or use of iterative reconstruction technique. CONTRAST:  OMNIPAQUE IOHEXOL 300 MG/ML  SOLN COMPARISON:  CT 01/22/2020 FINDINGS: Lower chest: Lung bases are clear. Hepatobiliary: Hypodensities in liver consistent benign cysts. Postcholecystectomy. Mild extrahepatic biliary duct dilatation not changed from prior. Pancreas: Pancreas is normal. No ductal dilatation. No pancreatic inflammation. Spleen: Normal spleen Adrenals/urinary tract: Adrenal glands are normal. No hydronephrosis. There is bilateral pelvicaliectasis and mild hydroureter related to the ileal conduit. No evidence of high-grade obstruction. Delayed imaging demonstrates poor excretion contrast in the collecting systems. This is favored to relate to renal insufficiency rather than ureteral obstruction. Urostomy in the RIGHT abdominal wall no complicating features. Stomach/Bowel: The stomach, duodenum, and small bowel normal. The colon and rectosigmoid colon are normal. The transverse colon anastomosis noted. No intraperitoneal free air or bowel obstruction Vascular/Lymphatic: Abdominal aorta is normal caliber with atherosclerotic calcification. There is no retroperitoneal or periportal lymphadenopathy. No pelvic lymphadenopathy. Reproductive: Post prostatectomy Other: No free fluid. Musculoskeletal: Posterior lumbar fusion. IMPRESSION: 1. No acute findings in the abdomen pelvis. 2. Post cystectomy with ileal conduit formation. Mild pelvicaliectasis and hydroureter without evidence of high-grade obstruction. 3. Poor excretion of contrast from the kidneys on delayed imaging favored to relate to renal insufficiency rather than ureteral obstruction. 4. Post transverse colon partial resection. No evidence of  metastatic disease in abdomen pelvis. 5.  Aortic Atherosclerosis (ICD10-I70.0). Electronically Signed   By: Genevive Bi M.D.   On: 02/23/2023 16:54

## 2023-03-04 ENCOUNTER — Other Ambulatory Visit: Payer: Self-pay

## 2023-03-04 ENCOUNTER — Telehealth: Payer: Self-pay

## 2023-03-04 LAB — FERRITIN: Ferritin: 219 ng/mL (ref 24–336)

## 2023-03-04 NOTE — Telephone Encounter (Signed)
-----   Message from [name] sent at 03/04/2023 10:36 AM EST ----- Angelica Chessman please let daughter know take a b12 500 mcg daily thanks.  Aram Beecham I ordered a BMP to be done in about 2 weeks. I want to see his kidney function improved before chemo teaching to make sure he is eligible for the type of chemotherapy we are giving. Thanks.

## 2023-03-04 NOTE — Telephone Encounter (Signed)
Unable to leave a message on patient's contact due to voicemail not being set up, was able to leave a message with Patient's daughter Alvia Grove regarding appointments and also able to connect with patient's daughter [name] and she stated that she would relay the message as well to her father

## 2023-03-04 NOTE — Telephone Encounter (Signed)
TC to [name] to inform of vitamin B12 level and the need for Vit B12 500 mcg daily per Dr. Nelta Numbers message. Also informed that she should be receiving a call soon to set up a lab appointment for pt in 2 weeks. She verbalizes understanding.

## 2023-03-07 ENCOUNTER — Other Ambulatory Visit: Payer: Self-pay | Admitting: Radiation Oncology

## 2023-03-07 ENCOUNTER — Inpatient Hospital Stay
Admission: RE | Admit: 2023-03-07 | Discharge: 2023-03-07 | Disposition: A | Discharge status: Home / Self Care | Payer: Self-pay | Source: Ambulatory Visit | Attending: Radiation Oncology | Admitting: Radiation Oncology

## 2023-03-07 ENCOUNTER — Telehealth: Payer: Self-pay

## 2023-03-07 DIAGNOSIS — C679 Malignant neoplasm of bladder, unspecified: Secondary | ICD-10-CM

## 2023-03-07 NOTE — Telephone Encounter (Signed)
Scheduled appointment per staff message. Talked with the patients daughter Jerris Baerwald and she is aware of the made appointment for the patient.

## 2023-03-08 NOTE — Progress Notes (Signed)
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 presented with signs/symptoms of: "Patient has longstanding history of non-muscle invasive bladder cancer underwent multiple TURBTs since 2015"  Biopsy 02/07/2023   02/23/2023 Todd [place], PA-C CT Abdomen Pelvis with Contrast CLINICAL DATA:  Bladder carcinoma. Recent cystectomy ileal conduit formation. Additional history of colon cancer with colectomy. * Tracking Code: BO * acute nonlocalized abdominal pain.  IMPRESSION: 1. No acute findings in the abdomen pelvis. 2. Post cystectomy with ileal conduit formation. Mild pelvicaliectasis and hydroureter without evidence of high-grade obstruction. 3. Poor excretion of contrast from the kidneys on delayed imaging favored to relate to renal insufficiency rather than ureteral obstruction. 4. Post transverse colon partial resection. No evidence of metastatic disease in abdomen pelvis. 5.  Aortic Atherosclerosis (ICD10-I70.0).  Past/Anticipated interventions by urology, if any:  02/07/2023   Dr. Vernia Buff Bjurlin([facility])   Past/Anticipated interventions by medical oncology, if any:  Under care of Dr. Geanie Berlin 03/03/2023 We discussed various options at this point in his scenario and that adjuvant therapy can be considered and recommended especially having lymph node positive for carcinoma.  Given he did not have neoadjuvant chemotherapy, which improved survival in muscle-invasive bladder cancer I think combination systemic therapy is appropriate.   His performance status is not great, and seems somewhat borderline.   Given this, cisplatin with gemcitabine can be an alternative.  Given the positive lymph node, consider adjuvant radiation is reasonable. A recent randomized phase II trial compared adjuvant sequential chemotherapy and radiation versus adjuvant chemotherapy alone showed a significant improvement in local control for  chemoradiation. JAMA surgery. Zaghloul MS. Et al. [email or web address]/. I will refer him for evaluation for radiation. Alternatively, adjuvant chemotherapy alone is reasonable as well.  We discussed potential side effects of chemotherapy. Potential side effects of discussed.  After discussion, Todd [name] expresses understanding and would like to proceed. Start cisplatin with gemcitabine if recovering well by 12/16. 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  Weight changes, if any:  Wt Readings from Last 3 Encounters:  03/14/23 170 lb 6.4 oz (77.3 kg)  03/03/23 172 lb 9.6 oz (78.3 kg)  02/23/23 179 lb (81.2 kg)   IPSS: Patient unable to complete form  Bowel/Bladder complaints, if any: Recent cystectomy and patient has a colostomy  --History of colon cancer 09/2011: Surgical resection of transverse colon, [id number]. Only 6 LN removed. 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 Completed 2 cycles of adjuvant 5-FU leucovorin, stopped future cycles due to side effects  Nausea/Vomiting, if any: Denies, but does report a decrease in appetite   Pain issues, if any:  Reports discomfort at ostomy site  SAFETY ISSUES: Prior radiation? No Pacemaker/ICD? No Possible current pregnancy? Male Is the patient on methotrexate? No  Current Complaints / other details:

## 2023-03-14 ENCOUNTER — Encounter: Payer: Self-pay | Admitting: Radiation Oncology

## 2023-03-14 ENCOUNTER — Ambulatory Visit
Admission: RE | Admit: 2023-03-14 | Discharge: 2023-03-14 | Disposition: A | Discharge status: Home / Self Care | Payer: 59 | Source: Ambulatory Visit | Attending: Radiation Oncology | Admitting: Radiation Oncology

## 2023-03-14 ENCOUNTER — Telehealth: Payer: Self-pay

## 2023-03-14 ENCOUNTER — Other Ambulatory Visit: Payer: Self-pay

## 2023-03-14 VITALS — BP 106/67 | HR 92 | Temp 97.6°F | Resp 20 | Ht 70.0 in | Wt 170.4 lb

## 2023-03-14 DIAGNOSIS — Z1509 Genetic susceptibility to other malignant neoplasm: Secondary | ICD-10-CM | POA: Diagnosis not present

## 2023-03-14 DIAGNOSIS — Z8551 Personal history of malignant neoplasm of bladder: Secondary | ICD-10-CM | POA: Insufficient documentation

## 2023-03-14 DIAGNOSIS — M545 Low back pain, unspecified: Secondary | ICD-10-CM | POA: Diagnosis not present

## 2023-03-14 DIAGNOSIS — Z860101 Personal history of adenomatous and serrated colon polyps: Secondary | ICD-10-CM | POA: Insufficient documentation

## 2023-03-14 DIAGNOSIS — Z7951 Long term (current) use of inhaled steroids: Secondary | ICD-10-CM | POA: Diagnosis not present

## 2023-03-14 DIAGNOSIS — Z79899 Other long term (current) drug therapy: Secondary | ICD-10-CM | POA: Diagnosis not present

## 2023-03-14 DIAGNOSIS — N529 Male erectile dysfunction, unspecified: Secondary | ICD-10-CM | POA: Diagnosis not present

## 2023-03-14 DIAGNOSIS — C775 Secondary and unspecified malignant neoplasm of intrapelvic lymph nodes: Secondary | ICD-10-CM

## 2023-03-14 DIAGNOSIS — C672 Malignant neoplasm of lateral wall of bladder: Secondary | ICD-10-CM | POA: Diagnosis present

## 2023-03-14 DIAGNOSIS — K219 Gastro-esophageal reflux disease without esophagitis: Secondary | ICD-10-CM | POA: Diagnosis not present

## 2023-03-14 DIAGNOSIS — J45909 Unspecified asthma, uncomplicated: Secondary | ICD-10-CM | POA: Insufficient documentation

## 2023-03-14 DIAGNOSIS — Z803 Family history of malignant neoplasm of breast: Secondary | ICD-10-CM | POA: Diagnosis not present

## 2023-03-14 DIAGNOSIS — F17210 Nicotine dependence, cigarettes, uncomplicated: Secondary | ICD-10-CM | POA: Diagnosis not present

## 2023-03-14 DIAGNOSIS — Z800 Family history of malignant neoplasm of digestive organs: Secondary | ICD-10-CM | POA: Insufficient documentation

## 2023-03-14 DIAGNOSIS — Z9221 Personal history of antineoplastic chemotherapy: Secondary | ICD-10-CM | POA: Diagnosis not present

## 2023-03-14 DIAGNOSIS — I10 Essential (primary) hypertension: Secondary | ICD-10-CM | POA: Diagnosis not present

## 2023-03-14 DIAGNOSIS — C679 Malignant neoplasm of bladder, unspecified: Secondary | ICD-10-CM

## 2023-03-14 NOTE — Telephone Encounter (Signed)
[facility] Clinical Social Work  Clinical Social Work was referred by medical provider for assessment of psychosocial needs.  Clinical Social Worker attempted to contact patient by phone to offer support and assess for needs.   CSW unable to reach patient and unable to leave vm. CSW will attempt again.   Marguerita Merles, LCSW  Clinical Social Worker [facility]

## 2023-03-14 NOTE — Progress Notes (Signed)
Radiation Oncology         [id number]) [phone] ________________________________  Initial outpatient Consultation  Name: Todd [name]. 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 Bladder cancer metastasized to intrapelvic lymph nodes (HCC)  C67.9 Ambulatory referral to Social Work   C77.5     2. Bladder carcinoma metastatic to intrapelvic lymph nodes (HCC)  C67.9    C77.5       HISTORY OF PRESENT ILLNESS: Todd [name]. is a 67 y.o. male seen at the request of Dr. 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 He also has a history of colon cancer, s/p transverse colectomy in 09/2011 and two cycles of adjuvant 5-FU leucovorin (discontinued due to side effects). 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 In regards to his bladder cancer, he was initially diagnosed with low grade papillary urothelial carcinoma in 10/2013 by TURBT under the care of Dr. Vernie Ammons. He had several repeat TURBT procedures-- 05/2014 followed by 2/6 BCG treatments, 05/2015, 07/2015 with 1-2 doses of BCG, but elected to transfer his care to [facility] urology therafter. He continued with multiple TURBTs followed by BCG. On TURBT in 08/2020, he was found to have invasive high-grade urothelial carcinoma with extensive involvement of the lamina propria and a minimal amount of muscularis propria, which was followed by repeat resection in 09/2020 again showing invasive carcinoma with lamina propria involvement but no muscularis involvement in this specimen. He was treated with 7 cycles of adjuvant pembrolizumab from 11/2020 through 09/2021. He developed hematuria and worsening renal function in 10/2022 which prompted an MRI of the  pelvis, performed on 12/05/22, showing increased size of the right posterolateral bladder wall mass. Follow up CT A/P on 01/03/23 and PET scan on 01/17/23 confirmed increased thickening of the right posterolateral bladder wall.  He was taken for radical cystoprostatectomy on 02/07/23 under the care of Dr. Angela Adam with [facility] uro-oncology. Final surgical pathology revealed pT3aN1, multifocal invasive high grade papillary urothelial carcinoma, with a 3 cm tumor in the right posterior upper bladder wall invading through the muscularis propria and a 0.9 cm satellite tumor in left bladder extending into left ureter and lamina propria. Additionally, there was extensive multifocal invasive and in situ disease present in all random sections of the bladder mucosa, with in situ carcinoma involving the prostatic urethra and left ureteral margin and 1 of 10 lymph nodes with metastatic disease.  He was referred to Dr. Cherly Hensen on 03/03/23 for further evaluation and to discuss potential systemic treatment. The recommendation is to start adjuvant chemotherapy with cisplatin and gemcitabine on 03/28/23, pending his surgical recovery. He has been kindly referred to [place] today to discuss the potential role for adjuvant radiation based on a recent randomized phase II trial that compared adjuvant sequential chemotherapy and radiation versus adjuvant chemotherapy alone  and showed a significant improvement in local control for chemoradiation.  JAMA surgery. Zaghloul MS. Et al. [email or web address]  PREVIOUS RADIATION THERAPY: No  PAST MEDICAL HISTORY:  Past Medical History:  Diagnosis Date   Allergy    Anxiety    Arthritis    Asthma    Bladder cancer (HCC) dx'd 10/2013   recurrent  Chronic gastritis    Chronic low back pain    Colon cancer (HCC) dx'd 09/2011   COPD (chronic obstructive pulmonary disease) (HCC)    Depression    Diverticulosis    Dysuria    GERD (gastroesophageal  reflux disease)    History of acute pancreatitis    due to SIRS   History of adenomatous polyp of colon    04-04-2015  last colonoscopy w/ multiple polyps--  tubular adenomas, beign polypoid colonic mucous   History of bladder cancer monitory by  dr Vernie Ammons   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   History of colon cancer oncologist-- dr Truett Perna--  dx  with Hereditary non-polyposis colon cancer syndrome,  MLH1 gene mutation--     dx june 2013--  stage II, T3,N0--  s/p  transverse colectomy--  completed 2 cycles  chemotherapy--  NO RECURRENCE in clinical remission   Hypertension    Lactose intolerance    Lower urinary tract symptoms (LUTS)    Lynch syndrome    MLH1 gene mutation    Short of breath on exertion    Wears glasses       PAST SURGICAL HISTORY: Past Surgical History:  Procedure Laterality Date   CHOLECYSTECTOMY  2013   COLON SURGERY  06/ 2013   Transverse   COLONOSCOPY N/A 12/04/2013   Procedure: COLONOSCOPY;  Surgeon: Louis Meckel, MD;  Location: [facility];  Service: Endoscopy;  Laterality: N/A;   COLONOSCOPY WITH ESOPHAGOGASTRODUODENOSCOPY (EGD)  last one 04-04-2015   polypecotmy's gastric and colon    CYSTOSCOPY W/ RETROGRADES Bilateral 06/06/2015   Procedure: BILATERAL  RETROGRADE PYELOGRAM;  Surgeon: Ihor Gully, MD;  Location: [facility];  Service: Urology;  Laterality: Bilateral;   ERCP N/A 01/01/2014   Procedure: ENDOSCOPIC RETROGRADE CHOLANGIOPANCREATOGRAPHY (ERCP);  Surgeon: Louis Meckel, MD;  Location: [facility];  Service: Endoscopy;  Laterality: N/A;  with spyglass - preprocedure ATB's   LUMBAR DISC SURGERY  x4   last one 2001   fusion L5 -- S1   SPYGLASS CHOLANGIOSCOPY N/A 01/01/2014   Procedure: SPYGLASS CHOLANGIOSCOPY;  Surgeon: Louis Meckel, MD;  Location: [facility];  Service: Endoscopy;  Laterality: N/A;   TONSILLECTOMY  age 82   TRANSURETHRAL RESECTION OF BLADDER TUMOR WITH GYRUS (TURBT-GYRUS) N/A 10/26/2013    Procedure: TRANSURETHRAL RESECTION OF BLADDER TUMOR WITH GYRUS (TURBT-GYRUS) ;  Surgeon: Garnett Farm, MD;  Location: [facility];  Service: Urology;  Laterality: N/A;   TRANSURETHRAL RESECTION OF BLADDER TUMOR WITH GYRUS (TURBT-GYRUS) N/A 05/27/2014   Procedure: TRANSURETHRAL RESECTION OF BLADDER TUMOR  AND TRANSURETHERAL BIOPSY OF PROSTATE;  Surgeon: Garnett Farm, MD;  Location: [facility] [place];  Service: Urology;  Laterality: N/A;   TRANSURETHRAL RESECTION OF BLADDER TUMOR WITH GYRUS (TURBT-GYRUS) N/A 06/06/2015   Procedure: TRANSURETHRAL RESECTION OF BLADDER TUMOR WITH GYRUS (TURBT-GYRUS);  Surgeon: Ihor Gully, MD;  Location: [facility];  Service: Urology;  Laterality: N/A;    FAMILY HISTORY:  Family History  Problem Relation Age of Onset   Colon cancer Brother        x2   Rectal cancer Brother    Colon cancer Sister    Colon cancer Maternal Uncle    Colon cancer Cousin        x 2   Breast cancer Maternal Grandmother    Colon polyps Daughter        x 3   Diabetes Father    Stroke Father  Esophageal cancer Neg Hx    Liver cancer Neg Hx    Pancreatic cancer Neg Hx    Stomach cancer Neg Hx     SOCIAL HISTORY:  Social History   Socioeconomic History   Marital status: Single    Spouse name: Not on file   Number of children: 6   Years of education: Not on file   Highest education level: Not on file  Occupational History   Occupation: disabled  Tobacco Use   Smoking status: Every Day    Current packs/day: 0.50    Average packs/day: 0.5 packs/day for 20.0 years (10.0 ttl pk-yrs)    Types: Cigarettes, E-cigarettes   Smokeless tobacco: Never   Tobacco comments:    hx 1PPD smoker quit jan 2015 for 20 yrs.  Pt has cut back to 1 pk /wk  Vaping Use   Vaping status: Never Used  Substance and Sexual Activity   Alcohol use: No   Drug use: No   Sexual activity: Not Currently  Other Topics Concern   Not on file  Social History Narrative   **  Merged History Encounter **       Social Determinants of Health   Financial Resource Strain: Low Risk  (02/08/2023)   Received from [facility]   Overall Financial Resource Strain (CARDIA)    Difficulty of Paying Living Expenses: Not very hard  Food Insecurity: Food Insecurity Present (03/14/2023)   Hunger Vital Sign    Worried About Running Out of Food in the Last Year: Often true    Ran Out of Food in the Last Year: Often true  Transportation Needs: Unmet Transportation Needs (03/14/2023)   PRAPARE - Administrator, Civil Service (Medical): Yes    Lack of Transportation (Non-Medical): No  Physical Activity: Inactive (12/28/2021)   Received from [facility]   Exercise Vital Sign    Days of Exercise per Week: 0 days    Minutes of Exercise per Session: 0 min  Stress: No Stress Concern Present (12/28/2021)   Received from [facility] - Occupational Stress Questionnaire    Feeling of Stress : Only a little  Social Connections: Moderately Isolated (12/28/2021)   Received from [facility]   Social Connection and Isolation Panel [NHANES]    Frequency of Communication with Friends and Family: More than three times a week    Frequency of Social Gatherings with Friends and Family: More than three times a week    Attends Religious Services: Never    Database administrator or Organizations: Yes    Attends Banker Meetings: Never    Marital Status: Divorced  Catering manager Violence: Not At Risk (03/14/2023)   Humiliation, Afraid, Rape, and Kick questionnaire    Fear of Current or Ex-Partner: No    Emotionally Abused: No    Physically Abused: No    Sexually Abused: No    ALLERGIES: Clindamycin/lincomycin, Milk-related compounds, and Hydrocodone  MEDICATIONS:  Current Outpatient Medications  Medication Sig Dispense Refill   albuterol (PROVENTIL HFA;VENTOLIN HFA) 108 (90 BASE) MCG/ACT inhaler Inhale 1 puff  into the lungs every 6 (six) hours as needed for wheezing or shortness of breath.     budesonide-formoterol (SYMBICORT) 160-4.5 MCG/ACT inhaler Inhale 2 puffs into the lungs 2 (two) times daily as needed (for respiratory flares).     clonazePAM (KLONOPIN) 1 MG tablet Take 1 mg by mouth 3 (three) times daily  as needed for anxiety. (Patient not taking: Reported on 03/03/2023)  0   gabapentin (NEURONTIN) 100 MG capsule Take 100 mg by mouth 3 (three) times daily.     Ibuprofen (ADVIL) 200 MG CAPS Take 200-800 mg by mouth every 6 (six) hours as needed (for pain or headaches).     Na Sulfate-K Sulfate-Mg Sulf 17.5-3.13-1.6 GM/177ML SOLN Suprep (no substitutions)-TAKE AS DIRECTED. 354 mL 0   oxyCODONE (ROXICODONE) 15 MG immediate release tablet Take 15 mg by mouth every 6 (six) hours as needed for pain.     oxyCODONE (ROXICODONE) 15 MG immediate release tablet Take 1 tablet (15 mg total) by mouth every 6 (six) hours as needed for pain. 30 tablet 0   pantoprazole (PROTONIX) 40 MG tablet Take 1 tablet (40 mg total) by mouth 2 (two) times daily. (Patient taking differently: Take 40 mg by mouth daily as needed (for reflux).) 180 tablet 0   valsartan-hydrochlorothiazide (DIOVAN-HCT) 160-12.5 MG tablet Take 1 tablet by mouth daily.     vitamin B-12 (CYANOCOBALAMIN) 500 MCG tablet Take 500 mcg by mouth daily.     No current facility-administered medications for this encounter.    REVIEW OF SYSTEMS:  On review of systems, the patient reports that he is doing well overall. He denies any chest pain, shortness of breath, cough, fevers, chills, or night sweats. He has had some intermittent nausea as well as decreased appetite that has led to consistent unintended weight loss. He denies any bowel or bladder disturbances, and denies abdominal pain or vomiting. He has chronic constipation. He denies any new musculoskeletal or joint aches or pains but notes discomfort at his ostomy site. A complete review of systems is  obtained and is otherwise negative.    PHYSICAL EXAM:  Wt Readings from Last 3 Encounters:  03/14/23 170 lb 6.4 oz (77.3 kg)  03/03/23 172 lb 9.6 oz (78.3 kg)  02/23/23 179 lb (81.2 kg)   Temp Readings from Last 3 Encounters:  03/14/23 97.6 F (36.4 C)  03/03/23 97.7 F (36.5 C) (Temporal)  02/25/23 98.4 F (36.9 C) (Oral)   BP Readings from Last 3 Encounters:  03/14/23 106/67  03/03/23 (!) 116/58  02/25/23 118/60   Pulse Readings from Last 3 Encounters:  03/14/23 92  03/03/23 66  02/25/23 84    /10  In general this is a well appearing African American man in no acute distress. He's alert and oriented x4 and appropriate throughout the examination. Cardiopulmonary assessment is negative for acute distress and he exhibits normal effort.     KPS = 60  100 - Normal; no complaints; no evidence of disease. 90   - Able to carry on normal activity; minor signs or symptoms of disease. 80   - Normal activity with effort; some signs or symptoms of disease. 81   - Cares for self; unable to carry on normal activity or to do active work. 60   - Requires occasional assistance, but is able to care for most of his personal needs. 50   - Requires considerable assistance and frequent medical care. 40   - Disabled; requires special care and assistance. 30   - Severely disabled; hospital admission is indicated although death not imminent. 20   - Very sick; hospital admission necessary; active supportive treatment necessary. 10   - Moribund; fatal processes progressing rapidly. 0     - Dead  Karnofsky DA, Abelmann WH, Craver LS and Burchenal [facility] [phone]) The use of the nitrogen mustards in  the palliative treatment of carcinoma: with particular reference to bronchogenic carcinoma Cancer 1 [id number]  LABORATORY DATA:  Lab Results  Component Value Date   WBC 4.7 03/03/2023   HGB 14.6 03/03/2023   HCT 45.6 03/03/2023   MCV 102.2 (H) 03/03/2023   PLT 272 03/03/2023   Lab Results  Component  Value Date   NA 137 03/03/2023   K 4.5 03/03/2023   CL 105 03/03/2023   CO2 25 03/03/2023   Lab Results  Component Value Date   ALT 7 03/03/2023   AST 10 (L) 03/03/2023   ALKPHOS 66 03/03/2023   BILITOT 0.4 03/03/2023     RADIOGRAPHY: CT ABDOMEN PELVIS W CONTRAST  Result Date: 02/23/2023 CLINICAL DATA:  Bladder carcinoma. Recent cystectomy ileal conduit formation. Additional history of colon cancer with colectomy. * Tracking Code: BO * acute nonlocalized abdominal pain. EXAM: CT ABDOMEN AND PELVIS WITH CONTRAST TECHNIQUE: Multidetector CT imaging of the abdomen and pelvis was performed using the standard protocol following bolus administration of intravenous contrast. RADIATION DOSE REDUCTION: This exam was performed according to the departmental dose-optimization program which includes automated exposure control, adjustment of the mA and/or kV according to patient size and/or use of iterative reconstruction technique. CONTRAST:  OMNIPAQUE IOHEXOL 300 MG/ML  SOLN COMPARISON:  CT 01/22/2020 FINDINGS: Lower chest: Lung bases are clear. Hepatobiliary: Hypodensities in liver consistent benign cysts. Postcholecystectomy. Mild extrahepatic biliary duct dilatation not changed from prior. Pancreas: Pancreas is normal. No ductal dilatation. No pancreatic inflammation. Spleen: Normal spleen Adrenals/urinary tract: Adrenal glands are normal. No hydronephrosis. There is bilateral pelvicaliectasis and mild hydroureter related to the ileal conduit. No evidence of high-grade obstruction. Delayed imaging demonstrates poor excretion contrast in the collecting systems. This is favored to relate to renal insufficiency rather than ureteral obstruction. Urostomy in the RIGHT abdominal wall no complicating features. Stomach/Bowel: The stomach, duodenum, and small bowel normal. The colon and rectosigmoid colon are normal. The transverse colon anastomosis noted. No intraperitoneal free air or bowel obstruction  Vascular/Lymphatic: Abdominal aorta is normal caliber with atherosclerotic calcification. There is no retroperitoneal or periportal lymphadenopathy. No pelvic lymphadenopathy. Reproductive: Post prostatectomy Other: No free fluid. Musculoskeletal: Posterior lumbar fusion. IMPRESSION: 1. No acute findings in the abdomen pelvis. 2. Post cystectomy with ileal conduit formation. Mild pelvicaliectasis and hydroureter without evidence of high-grade obstruction. 3. Poor excretion of contrast from the kidneys on delayed imaging favored to relate to renal insufficiency rather than ureteral obstruction. 4. Post transverse colon partial resection. No evidence of metastatic disease in abdomen pelvis. 5.  Aortic Atherosclerosis (ICD10-I70.0). Electronically Signed   By: Genevive Bi M.D.   On: 02/23/2023 16:54      IMPRESSION/PLAN: 1. 67 y.o. 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 We discussed the natural history of locally advanced bladder cancer and general treatment, highlighting the role of radiotherapy in the management. We discussed the available radiation techniques, and focused on the details and logistics of delivery. In the study referenced above, the radiation was delivered BID x3 weeks, sandwiched between 2 cycles of chemotherapy, delivered before and after the radiation, with a 1 week break prior to start of radiation following the initial  2 cycles of systemic therapy and a 1 week break following the radiation prior to resuming systemic therapy. Another option would be  to proceed with a 5 week course of once daily radiation instead of 3 weeks of BID radiation. We reviewed the anticipated acute and late sequelae associated with radiation in this setting. The patient was encouraged to  ask questions that were answered to his stated satisfaction.  At the end of our conversation, the patient is eager to proceed with the recommended chemoradiation but prefers to have the 5 weeks of once daily radiation as opposed to twice daily radiation treatments if Dr. Cherly Hensen feels this would be acceptable and equally effective as this would lessen the burden for transportation since he does not drive himself. He is currently living at a facility that will help provide transportation assistance, he is just not sure to what degree. He is tentatively scheduled to start his first cycle of chemotherapy 03/28/23 so we will collaborate with Dr. Cherly Hensen to finalize the recommended treatment plan regarding BID versus once daily radiation dosing and will schedule the CT SIM/treatment planning near the completion of his 2nd cycle of chemotherapy so that he is ready to start the radiation approximately 1 week thereafter, He and his daughter appear to have a food understanding of his disease and are comfortable and in agreement with the stated plan. We enjoyed meeting them today and look forward to continuing to participate in his care.  2. Postoperative Erectile Dysfunction. The patient reports severe erectile dysfunction since the time of his cystoprostatectomy and is eager to try mediations and/or injections to correct this. He has a friend coming to visit him for his birthday this weekend and would like to address the issue prior to that if at all possible. He is not planning to continue his care with the [facility] urology team due to his recent move to [facility] and instead prefers to transfer all of his care here, locally. We will make a referral to [facility] to establish care and address his ED.   We personally spent 70 minutes in this encounter including chart review, reviewing radiological studies, meeting face-to-face with the patient, entering orders and completing documentation.    Marguarite Arbour,  PA-C    Margaretmary Dys, MD  [facility]  Radiation Oncology Direct Dial: [phone]  Fax: [phone] [email or web address]  Skype  LinkedIn   This document serves as a record of services personally performed by Margaretmary Dys, MD and Marcello Fennel, PA-C. It was created on their behalf by Mickie Bail, a trained medical scribe. The creation of this record is based on the scribe's personal observations and the provider's statements to them. This document has been checked and approved by the attending provider.

## 2023-03-14 NOTE — Telephone Encounter (Signed)
Reviewed outside labs. 03/08/23 Cr 2.18.  From [place] Cr 1.99 on 11/21  Tammi Please let daughter know to increase fluid to help with kidney function. Goal 70 oz per day. If still not improvement from BMP this Friday, will need to change to carboplatin from cisplatin to due to renal dysfunction. Thanks.

## 2023-03-15 ENCOUNTER — Other Ambulatory Visit: Payer: Self-pay

## 2023-03-15 ENCOUNTER — Telehealth: Payer: Self-pay

## 2023-03-15 DIAGNOSIS — N5082 Scrotal pain: Secondary | ICD-10-CM

## 2023-03-15 NOTE — Telephone Encounter (Signed)
[facility] Clinical Social Work  Clinical Social Work was referred by medical provider for assessment of psychosocial needs.  Clinical Social Worker attempted to contact patient by phone to offer support and assess for needs, CSW unable to reach patient at numbers listed in chart, unable to leave vm. CSW called patient's daughter P. Bassinger listed in chart and left vm with direct contact. CSW will attempt again to reach patient.  Marguerita Merles, LCSWA Clinical Social Worker [facility]

## 2023-03-16 ENCOUNTER — Telehealth: Payer: Self-pay | Admitting: *Deleted

## 2023-03-16 ENCOUNTER — Other Ambulatory Visit: Payer: Self-pay | Admitting: *Deleted

## 2023-03-16 ENCOUNTER — Telehealth: Payer: Self-pay

## 2023-03-16 DIAGNOSIS — C679 Malignant neoplasm of bladder, unspecified: Secondary | ICD-10-CM

## 2023-03-16 NOTE — Telephone Encounter (Signed)
[facility] Clinical Social Work  Clinical Social Work was referred by medical provider for assessment of psychosocial needs.  Clinical Social Worker attempted to contact patient by phone to offer support and assess for needs.  CSW made final attempt to reach patient, unable to do so. CSW left vm on 12/03 on daughter's vm with direct contact. Referral will be closed. Referral can be resent if needed.  Marguerita Merles, LCSWA Clinical Social Worker [facility]

## 2023-03-16 NOTE — Telephone Encounter (Signed)
From [place] Cr 1.99 on 11/21   Todd [name] Please let daughter know to increase fluid to help with kidney function. Goal 70 oz per day. If still not improvement from BMP this Friday, will need to change to carboplatin from cisplatin to due to renal dysfunction.  Notified [name] of message. She will make sure he increases his fluid intake.

## 2023-03-17 ENCOUNTER — Other Ambulatory Visit: Payer: Self-pay

## 2023-03-18 ENCOUNTER — Inpatient Hospital Stay: Payer: 59

## 2023-03-18 ENCOUNTER — Other Ambulatory Visit: Payer: Self-pay

## 2023-03-18 ENCOUNTER — Telehealth: Payer: Self-pay

## 2023-03-18 DIAGNOSIS — C775 Secondary and unspecified malignant neoplasm of intrapelvic lymph nodes: Secondary | ICD-10-CM | POA: Insufficient documentation

## 2023-03-18 DIAGNOSIS — C679 Malignant neoplasm of bladder, unspecified: Secondary | ICD-10-CM

## 2023-03-18 DIAGNOSIS — C672 Malignant neoplasm of lateral wall of bladder: Secondary | ICD-10-CM | POA: Insufficient documentation

## 2023-03-18 LAB — CMP (CANCER CENTER ONLY)
ALT: 7 U/L (ref 0–44)
AST: 10 U/L — ABNORMAL LOW (ref 15–41)
Albumin: 3.7 g/dL (ref 3.5–5.0)
Alkaline Phosphatase: 53 U/L (ref 38–126)
Anion gap: 6 (ref 5–15)
BUN: 27 mg/dL — ABNORMAL HIGH (ref 8–23)
CO2: 29 mmol/L (ref 22–32)
Calcium: 8.6 mg/dL — ABNORMAL LOW (ref 8.9–10.3)
Chloride: 107 mmol/L (ref 98–111)
Creatinine: 2.07 mg/dL — ABNORMAL HIGH (ref 0.61–1.24)
GFR, Estimated: 35 mL/min — ABNORMAL LOW (ref >60)
Glucose, Bld: 94 mg/dL (ref 70–99)
Potassium: 4.4 mmol/L (ref 3.5–5.1)
Sodium: 142 mmol/L (ref 135–145)
Total Bilirubin: 0.4 mg/dL (ref <1.2)
Total Protein: 6.3 g/dL — ABNORMAL LOW (ref 6.5–8.1)

## 2023-03-18 LAB — CBC WITH DIFFERENTIAL (CANCER CENTER ONLY)
Abs Immature Granulocytes: 0.01 10*3/uL (ref 0.00–0.07)
Basophils Absolute: 0 10*3/uL (ref 0.0–0.1)
Basophils Relative: 0 %
Eosinophils Absolute: 0.3 10*3/uL (ref 0.0–0.5)
Eosinophils Relative: 5 %
HCT: 39.4 % (ref 39.0–52.0)
Hemoglobin: 12.6 g/dL — ABNORMAL LOW (ref 13.0–17.0)
Immature Granulocytes: 0 %
Lymphocytes Relative: 38 %
Lymphs Abs: 2.2 10*3/uL (ref 0.7–4.0)
MCH: 32.3 pg (ref 26.0–34.0)
MCHC: 32 g/dL (ref 30.0–36.0)
MCV: 101 fL — ABNORMAL HIGH (ref 80.0–100.0)
Monocytes Absolute: 0.5 10*3/uL (ref 0.1–1.0)
Monocytes Relative: 9 %
Neutro Abs: 2.8 10*3/uL (ref 1.7–7.7)
Neutrophils Relative %: 48 %
Platelet Count: 134 10*3/uL — ABNORMAL LOW (ref 150–400)
RBC: 3.9 MIL/uL — ABNORMAL LOW (ref 4.22–5.81)
RDW: 12.2 % (ref 11.5–15.5)
WBC Count: 5.8 10*3/uL (ref 4.0–10.5)
nRBC: 0 % (ref 0.0–0.2)

## 2023-03-18 LAB — MAGNESIUM: Magnesium: 2 mg/dL (ref 1.7–2.4)

## 2023-03-18 MED ORDER — ONDANSETRON HCL 8 MG PO TABS: 8.0000 mg | ORAL_TABLET | Freq: Three times a day (TID) | ORAL | 1 refills | Status: DC | PRN

## 2023-03-18 MED ORDER — PROCHLORPERAZINE MALEATE 10 MG PO TABS: 10.0000 mg | ORAL_TABLET | Freq: Four times a day (QID) | ORAL | 1 refills | Status: DC | PRN

## 2023-03-18 NOTE — Progress Notes (Signed)
ON PATHWAY REGIMEN - Bladder  No Change  Continue With Treatment as Ordered.  Original Decision Date/Time: 03/03/2023 15:59     A cycle is every 21 days:     Gemcitabine      Cisplatin   **Always confirm dose/schedule in your pharmacy ordering system**  Patient Characteristics: Post-Cystectomy without Neoadjuvant Therapy, M0 (Pathologic Staging), pT0-4a, pN1, M0 Therapeutic Status: Post-Cystectomy without Neoadjuvant Therapy, M0 (Pathologic Staging) AJCC M Category: cM0 AJCC 8 Stage Grouping: IIIA AJCC T Category: pT3a AJCC N Category: pN1 Intent of Therapy: Curative Intent, Discussed with Patient

## 2023-03-18 NOTE — Progress Notes (Signed)
DISCONTINUE ON PATHWAY REGIMEN - Bladder     A cycle is every 21 days:     Gemcitabine      Cisplatin   **Always confirm dose/schedule in your pharmacy ordering system**  PRIOR TREATMENT: [id number]: Gemcitabine 1,000 mg/m2 D1, 8  + Cisplatin 70 mg/m2 D1 q21 Days x 3 - 4 Cycles  START ON PATHWAY REGIMEN - Bladder     A cycle is every 14 days:     Nivolumab   **Always confirm dose/schedule in your pharmacy ordering system**  Patient Characteristics: Post-Cystectomy without Neoadjuvant Therapy, M0 (Pathologic Staging), pT0-4a, pN1, M0 Therapeutic Status: Post-Cystectomy without Neoadjuvant Therapy, M0 (Pathologic Staging) AJCC M Category: cM0 AJCC 8 Stage Grouping: IIIA AJCC T Category: pT3a AJCC N Category: pN1 Intent of Therapy: Curative Intent, Discussed with Patient

## 2023-03-18 NOTE — Telephone Encounter (Signed)
Per Dr. Nelta Numbers request, TC to [facility] Urology Dept to try to connect w/ nurse navigator to assist pt with f/u ostomy care and supplies after his cystectomy. Asked to speak w/ Todd Salk, RN--navigator listed in [facility] chart. Receptionist took the name and number for this RN and stated she would have someone call back. There has been no return call after approx 2 hours. TC to pt and his daughter Todd [name] to inform of this. Daughter reports that pt has moved to [facility] but has not been referred to a urologist here. Advised to call pt's urologist at [facility] and request to speak to the nurse about the current need for f/u ostomy care and supplies, and also to ask for a referral to a urologist in [place] if pt is going to be here long-term. Also suggested calling the urology department at [facility] (phone number given) and asking to speak with Todd Salk, RN or another RN navigator regarding needs. She verbalizes understanding.

## 2023-03-18 NOTE — Progress Notes (Signed)
Todd [name]  He had cystectomy with ostomy in place from ileal conduit. This was done at [facility]. Would you reach out to nurse navigator there in their urology department if they can help direct patient and daughter how to care for it and getting supplies to postoperative care? Thanks you

## 2023-03-18 NOTE — Progress Notes (Signed)
Labs reviewed.  Creatinine clearance 37.  No improvement of renal function.  Called and spoke to patient's daughter, since there is no improvement in renal function, cisplatin therapy is not recommended in this setting.  Recommend adjuvant nivolumab.  Patient has appointment scheduled for teaching and may proceed with teaching for nivolumab.

## 2023-03-21 ENCOUNTER — Ambulatory Visit: Payer: 59 | Admitting: Radiation Oncology

## 2023-03-21 NOTE — Progress Notes (Signed)

## 2023-03-22 ENCOUNTER — Other Ambulatory Visit: Payer: 59

## 2023-03-23 ENCOUNTER — Other Ambulatory Visit: Payer: Self-pay

## 2023-03-25 ENCOUNTER — Ambulatory Visit: Payer: 59 | Admitting: Radiation Oncology

## 2023-03-25 ENCOUNTER — Inpatient Hospital Stay: Payer: 59

## 2023-03-25 ENCOUNTER — Ambulatory Visit
Admission: RE | Admit: 2023-03-25 | Discharge: 2023-03-25 | Disposition: A | Discharge status: Home / Self Care | Payer: 59 | Source: Ambulatory Visit

## 2023-03-25 DIAGNOSIS — N5082 Scrotal pain: Secondary | ICD-10-CM

## 2023-03-28 ENCOUNTER — Ambulatory Visit: Payer: 59

## 2023-03-28 ENCOUNTER — Inpatient Hospital Stay: Payer: 59

## 2023-03-30 NOTE — Progress Notes (Signed)
  Radiation Oncology         [phone]) [phone] ________________________________  Name: Todd [name]. MRN: [id number]  Date: 03/31/2023  DOB: 25-Nov-1955  SIMULATION AND TREATMENT PLANNING NOTE    ICD-10-CM   1. 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  Identity was confirmed.  All relevant records and images related to the planned course of therapy were reviewed.  The patient freely provided informed written consent to proceed with treatment after reviewing the details related to the planned course of therapy. The consent form was witnessed and verified by the simulation staff.  Then, the patient was set-up in a stable reproducible  supine position for radiation therapy.  Contrast was instilled into the bladder with a catheter under sterile conditions.  CT images were obtained.  Surface markings were placed.  The CT images were loaded into the planning software.  Then the target and avoidance structures were contoured.  Treatment planning then occurred.  The radiation prescription was entered and confirmed.  Then, I designed and supervised the construction of a total of 5 medically necessary complex treatment devices including VacLoc body positioner and 4 MLCs to shield the bowel and femoral necks.  I have requested : 3D Simulation  I have requested a DVH of the following structures: small bowel, rectum, left femoral head, right femoral head and targets.  SPECIAL TREATMENT PROCEDURE:  The planned course of therapy using radiation constitutes a special treatment procedure. Special care is required in the management of this patient for the following reasons. This treatment constitutes a Special Treatment Procedure for the  following reason: [ Concurrent chemotherapy requiring careful monitoring for increased toxicities of treatment including weekly laboratory values..  The special nature of the planned course of radiotherapy will require increased physician supervision and oversight to ensure patient's safety with optimal treatment outcomes.  PLAN:  The patient will receive 45 Gy in 25 fractions to the surgical bed and pelvic nodes followed by a boost of 5.4 Gy in 3 fractions of 1.8 Gy to the surgical bed to a total dose of 50.4 Gy.  ________________________________  Artist Pais Kathrynn Running, M.D.

## 2023-03-31 ENCOUNTER — Ambulatory Visit
Admission: RE | Admit: 2023-03-31 | Discharge: 2023-03-31 | Disposition: A | Discharge status: Home / Self Care | Payer: 59 | Source: Ambulatory Visit | Attending: Radiation Oncology | Admitting: Radiation Oncology

## 2023-03-31 DIAGNOSIS — C679 Malignant neoplasm of bladder, unspecified: Secondary | ICD-10-CM | POA: Diagnosis present

## 2023-03-31 DIAGNOSIS — C775 Secondary and unspecified malignant neoplasm of intrapelvic lymph nodes: Secondary | ICD-10-CM | POA: Insufficient documentation

## 2023-04-01 ENCOUNTER — Other Ambulatory Visit: Payer: Self-pay

## 2023-04-04 ENCOUNTER — Ambulatory Visit: Payer: 59

## 2023-04-05 ENCOUNTER — Ambulatory Visit: Payer: 59

## 2023-04-07 ENCOUNTER — Ambulatory Visit: Payer: 59

## 2023-04-08 ENCOUNTER — Ambulatory Visit: Payer: 59

## 2023-04-09 DIAGNOSIS — C679 Malignant neoplasm of bladder, unspecified: Secondary | ICD-10-CM | POA: Diagnosis not present

## 2023-04-11 ENCOUNTER — Ambulatory Visit: Payer: 59

## 2023-04-12 ENCOUNTER — Ambulatory Visit: Payer: 59

## 2023-04-12 ENCOUNTER — Other Ambulatory Visit: Payer: Self-pay

## 2023-04-12 ENCOUNTER — Telehealth: Payer: Self-pay | Admitting: Radiation Oncology

## 2023-04-12 NOTE — Telephone Encounter (Signed)
Pt currently hospitalized at [facility]. Faility calling to confirm pt is still scheduled to start tx with [place] under the care of Dr. Kathrynn Running. All details confirmed and new start date given to provider to notify pt.

## 2023-04-14 ENCOUNTER — Ambulatory Visit: Payer: 59

## 2023-04-15 ENCOUNTER — Other Ambulatory Visit: Payer: Self-pay

## 2023-04-15 ENCOUNTER — Ambulatory Visit: Payer: 59

## 2023-04-18 ENCOUNTER — Ambulatory Visit: Payer: 59

## 2023-04-19 ENCOUNTER — Ambulatory Visit: Payer: 59 | Admitting: Cardiovascular Disease

## 2023-04-19 ENCOUNTER — Ambulatory Visit: Payer: 59

## 2023-04-19 ENCOUNTER — Other Ambulatory Visit: Payer: Self-pay

## 2023-04-20 ENCOUNTER — Ambulatory Visit: Payer: 59

## 2023-04-20 ENCOUNTER — Other Ambulatory Visit: Payer: Self-pay

## 2023-04-21 ENCOUNTER — Ambulatory Visit: Payer: 59

## 2023-04-21 ENCOUNTER — Other Ambulatory Visit: Payer: Self-pay

## 2023-04-21 NOTE — Progress Notes (Deleted)
 Cardiology Office Note   Date:  04/21/2023   ID:  Todd [name] Joshua Mickey., DOB Sep 13, 1955, MRN [id number]  PCP:  System, Provider Not In  Cardiologist:   None Referring:  ***  No chief complaint on file.     History of Present Illness: Todd [name]. is a 68 y.o. male who presents for ***     Past Medical History:  Diagnosis Date   Allergy    Anxiety    Arthritis    Asthma    Bladder cancer (HCC) dx'd 10/2013   recurrent   Chronic gastritis    Chronic low back pain    Colon cancer (HCC) dx'd 09/2011   COPD (chronic obstructive pulmonary disease) (HCC)    Depression    Diverticulosis    Dysuria    GERD (gastroesophageal reflux disease)    History of acute pancreatitis    due to SIRS   History of adenomatous polyp of colon    04-04-2015  last colonoscopy w/ multiple polyps--  tubular adenomas, beign polypoid colonic mucous   History of bladder cancer monitory by  dr Ceil   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   History of colon cancer oncologist-- dr cloretta--  dx  with Hereditary non-polyposis colon cancer syndrome,  MLH1 gene mutation--     dx june 2013--  stage II, T3,N0--  s/p  transverse colectomy--  completed 2 cycles  chemotherapy--  NO RECURRENCE in clinical remission   Hypertension    Lactose intolerance    Lower urinary tract symptoms (LUTS)    Lynch syndrome    MLH1 gene mutation    Short of breath on exertion    Wears glasses     Past Surgical History:  Procedure Laterality Date   CHOLECYSTECTOMY  2013   COLON SURGERY  06/ 2013   Transverse   COLONOSCOPY N/A 12/04/2013   Procedure: COLONOSCOPY;  Surgeon: Lamar JONETTA Aho, MD;  Location: [facility];  Service: Endoscopy;  Laterality: N/A;   COLONOSCOPY WITH ESOPHAGOGASTRODUODENOSCOPY (EGD)  last one 04-04-2015   polypecotmy's gastric and colon    CYSTOSCOPY W/ RETROGRADES Bilateral 06/06/2015   Procedure: BILATERAL  RETROGRADE PYELOGRAM;  Surgeon: Mark Ottelin, MD;  Location: [facility];  Service: Urology;  Laterality: Bilateral;   ERCP N/A 01/01/2014   Procedure: ENDOSCOPIC RETROGRADE CHOLANGIOPANCREATOGRAPHY (ERCP);  Surgeon: Lamar JONETTA Aho, MD;  Location: [facility];  Service: Endoscopy;  Laterality: N/A;  with spyglass - preprocedure ATB's   LUMBAR DISC SURGERY  x4   last one 2001   fusion L5 -- S1   SPYGLASS CHOLANGIOSCOPY N/A 01/01/2014   Procedure: SPYGLASS CHOLANGIOSCOPY;  Surgeon: Lamar JONETTA Aho, MD;  Location: [facility];  Service: Endoscopy;  Laterality: N/A;   TONSILLECTOMY  age 40   TRANSURETHRAL RESECTION OF BLADDER TUMOR WITH GYRUS (TURBT-GYRUS) N/A 10/26/2013   Procedure: TRANSURETHRAL RESECTION OF BLADDER TUMOR WITH GYRUS (TURBT-GYRUS) ;  Surgeon: Mark C Ottelin, MD;  Location: [facility];  Service: Urology;  Laterality: N/A;   TRANSURETHRAL RESECTION OF BLADDER TUMOR WITH GYRUS (TURBT-GYRUS) N/A 05/27/2014   Procedure: TRANSURETHRAL RESECTION OF BLADDER TUMOR  AND TRANSURETHERAL BIOPSY OF PROSTATE;  Surgeon: Mark C Ottelin, MD;  Location: [facility];  Service: Urology;  Laterality: N/A;   TRANSURETHRAL RESECTION OF BLADDER TUMOR WITH GYRUS (TURBT-GYRUS) N/A 06/06/2015   Procedure: TRANSURETHRAL RESECTION OF BLADDER TUMOR WITH GYRUS (TURBT-GYRUS);  Surgeon: Mark Ottelin, MD;  Location: [facility];  Service: Urology;  Laterality: N/A;     Current Outpatient Medications  Medication Sig Dispense Refill   albuterol (PROVENTIL HFA;VENTOLIN HFA) 108 (90 BASE) MCG/ACT inhaler Inhale 1 puff into the lungs every 6 (six) hours as needed for wheezing or shortness of breath.     budesonide-formoterol (SYMBICORT) 160-4.5 MCG/ACT inhaler Inhale 2 puffs into the lungs 2 (two) times daily as needed (for respiratory flares).     clonazePAM  (KLONOPIN ) 1 MG tablet Take 1 mg by mouth 3 (three) times daily as needed for anxiety. (Patient not taking: Reported on 03/03/2023)  0   gabapentin  (NEURONTIN ) 100 MG capsule Take 100 mg by  mouth 3 (three) times daily.     Ibuprofen (ADVIL) 200 MG CAPS Take 200-800 mg by mouth every 6 (six) hours as needed (for pain or headaches).     Na Sulfate-K Sulfate-Mg Sulf 17.5-3.13-1.6 GM/177ML SOLN Suprep (no substitutions)-TAKE AS DIRECTED. 354 mL 0   ondansetron  (ZOFRAN ) 8 MG tablet Take 1 tablet (8 mg total) by mouth every 8 (eight) hours as needed for nausea or vomiting. 30 tablet 1   oxyCODONE  (ROXICODONE ) 15 MG immediate release tablet Take 15 mg by mouth every 6 (six) hours as needed for pain.     oxyCODONE  (ROXICODONE ) 15 MG immediate release tablet Take 1 tablet (15 mg total) by mouth every 6 (six) hours as needed for pain. 30 tablet 0   pantoprazole  (PROTONIX ) 40 MG tablet Take 1 tablet (40 mg total) by mouth 2 (two) times daily. (Patient taking differently: Take 40 mg by mouth daily as needed (for reflux).) 180 tablet 0   prochlorperazine  (COMPAZINE ) 10 MG tablet Take 1 tablet (10 mg total) by mouth every 6 (six) hours as needed for nausea or vomiting. 30 tablet 1   valsartan -hydrochlorothiazide  (DIOVAN -HCT) 160-12.5 MG tablet Take 1 tablet by mouth daily.     vitamin B-12 (CYANOCOBALAMIN ) 500 MCG tablet Take 500 mcg by mouth daily.     No current facility-administered medications for this visit.    Allergies:   Clindamycin /lincomycin, Milk-related compounds, and Hydrocodone    Social History:  The patient  reports that he has been smoking cigarettes and e-cigarettes. He has a 10 pack-year smoking history. He has never used smokeless tobacco. He reports that he does not drink alcohol and does not use drugs.   Family History:  The patient's ***family history includes Breast cancer in his maternal grandmother; Colon cancer in his brother, cousin, maternal uncle, and sister; Colon polyps in his daughter; Diabetes in his father; Rectal cancer in his brother; Stroke in his father.    ROS:  Please see the history of present illness.   Otherwise, review of systems are positive for  {NONE DEFAULTED:18576}.   All other systems are reviewed and negative.    PHYSICAL EXAM: VS:  There were no vitals taken for this visit. , BMI There is no height or weight on file to calculate BMI. GENERAL:  Well appearing HEENT:  Pupils equal round and reactive, fundi not visualized, oral mucosa unremarkable NECK:  No jugular venous distention, waveform within normal limits, carotid upstroke brisk and symmetric, no bruits, no thyromegaly LYMPHATICS:  No cervical, inguinal adenopathy LUNGS:  Clear to auscultation bilaterally BACK:  No CVA tenderness CHEST:  Unremarkable HEART:  PMI not displaced or sustained,S1 and S2 within normal limits, no S3, no S4, no clicks, no rubs, *** murmurs ABD:  Flat, positive bowel sounds normal in frequency in pitch, no bruits, no rebound, no guarding, no midline pulsatile mass, no  hepatomegaly, no splenomegaly EXT:  2 plus pulses throughout, no edema, no cyanosis no clubbing SKIN:  No rashes no nodules NEURO:  Cranial nerves II through XII grossly intact, motor grossly intact throughout PSYCH:  Cognitively intact, oriented to person place and time    EKG:        Recent Labs: 03/18/2023: ALT 7; BUN 27; Creatinine 2.07; Hemoglobin 12.6; Magnesium 2.0; Platelet Count 134; Potassium 4.4; Sodium 142    Lipid Panel No results found for: CHOL, TRIG, HDL, CHOLHDL, VLDL, LDLCALC, LDLDIRECT    Wt Readings from Last 3 Encounters:  03/14/23 170 lb 6.4 oz (77.3 kg)  03/03/23 172 lb 9.6 oz (78.3 kg)  02/23/23 179 lb (81.2 kg)      Other studies Reviewed: Additional studies/ records that were reviewed today include: ***. Review of the above records demonstrates:  Please see elsewhere in the note.  ***   ASSESSMENT AND PLAN:  ***   Current medicines are reviewed at length with the patient today.  The patient {ACTIONS; HAS/DOES NOT HAVE:19233} concerns regarding medicines.  The following changes have been made:  {PLAN; NO  CHANGE:13088:s}  Labs/ tests ordered today include: *** No orders of the defined types were placed in this encounter.    Disposition:   FU with ***    Signed, Lynwood Schilling, MD  04/21/2023 2:19 PM    [facility]

## 2023-04-22 ENCOUNTER — Ambulatory Visit: Payer: 59

## 2023-04-22 ENCOUNTER — Other Ambulatory Visit: Payer: Self-pay

## 2023-04-22 ENCOUNTER — Ambulatory Visit: Payer: 59 | Admitting: Cardiology

## 2023-04-24 NOTE — Progress Notes (Deleted)
 Patient Care Team: System, Provider Not In as PCP - General  Clinic Day:  04/25/2023  Referring physician: Tina Pauletta BROCKS, MD  ASSESSMENT & PLAN:   Assessment & Plan: Todd [name] is a 68 y.o.male with history of CKD, COPD, Lynch syndrome, NMIBC being [facility]rothelial carcinoma.  Patient has longstanding history of non-muscle invasive bladder cancer underwent multiple TURBTs since 2015.  He has received BCG, and pembrolizumab.  Last pembrolizumab was in 09/2021.  PET/CT in October 2024 showed thickening of the right posterolateral bladder wall.  Due to findings of increasing right posterior lateral bladder wall thickening and hydronephrosis suspect higher stage disease patient underwent right radical cystoprostatectomy on 02/07/23 at [facility].  Final pathology showed pT3N1 high-grade urothelial carcinoma invades perivesical soft tissue.  Residual cancer cells occupying greater than or equal to 50% of tumor bed or absence of regressive changes.  All margins negative.  1 left pelvic lymph node positive positive for malignancy.  Margins positive for CIS.  We discussed adjuvant chemoradiation.  However his renal function has not improved since first visit.  He was admitted for pyelonephritis in December at [facility].  A CT at the time did not show a local recurrence or any abdomen and pelvis.  Discussed potential side effects of cisplatin.  He is not cisplatin candidate.  Recommend proceed with radiation, followed by restaging scan and if no distant disease, then adjuvant immunotherapy with nivolumab .   Diagnosis: pT3N1cM0 HG UC. 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 Patrcia  Follow up with CT CAP about 4-6 weeks post  treatment    The patient understands the plans discussed today and is in agreement with them.  He knows to contact our office if he develops concerns prior to his next appointment.  Pauletta [name] Tina, MD  [facility] - A DEPT OF [facility] [address] [place] [address] Dept: [phone] Dept Fax: [phone]   No orders of the defined types were placed in this encounter.     CHIEF COMPLAINT:  CC: ***  Current Treatment:  ***  INTERVAL HISTORY:  [name] is here today for repeat clinical assessment. He denies fevers or chills. He denies pain. His appetite is good. His weight {Weight change:10426}.  I have reviewed the past medical history, past surgical history, social history and family history with the patient and they are unchanged from previous note.  ALLERGIES:  is allergic to clindamycin /lincomycin, milk-related compounds, and hydrocodone.  MEDICATIONS:  Current Outpatient Medications  Medication Sig Dispense Refill   albuterol (PROVENTIL HFA;VENTOLIN HFA) 108 (90 BASE) MCG/ACT inhaler Inhale 1 puff into the lungs every 6 (six) hours as needed for wheezing or shortness of breath.     budesonide-formoterol (SYMBICORT) 160-4.5 MCG/ACT inhaler Inhale 2 puffs into the lungs 2 (two) times daily as needed (for respiratory flares).     clonazePAM  (KLONOPIN ) 1 MG tablet Take 1 mg by mouth 3 (three) times daily as needed for anxiety. (Patient not taking: Reported on 03/03/2023)  0   gabapentin  (NEURONTIN ) 100 MG capsule Take 100 mg by mouth 3 (three) times daily.     Ibuprofen (ADVIL) 200 MG CAPS Take 200-800 mg by mouth every 6 (six) hours as needed (for  pain or headaches).     Na Sulfate-K Sulfate-Mg Sulf 17.5-3.13-1.6 GM/177ML SOLN Suprep (no substitutions)-TAKE AS DIRECTED. 354 mL 0   ondansetron  (ZOFRAN ) 8 MG tablet Take 1 tablet (8 mg total) by mouth every 8 (eight) hours as needed for nausea or vomiting. 30  tablet 1   oxyCODONE  (ROXICODONE ) 15 MG immediate release tablet Take 15 mg by mouth every 6 (six) hours as needed for pain.     oxyCODONE  (ROXICODONE ) 15 MG immediate release tablet Take 1 tablet (15 mg total) by mouth every 6 (six) hours as needed for pain. 30 tablet 0   pantoprazole  (PROTONIX ) 40 MG tablet Take 1 tablet (40 mg total) by mouth 2 (two) times daily. (Patient taking differently: Take 40 mg by mouth daily as needed (for reflux).) 180 tablet 0   prochlorperazine  (COMPAZINE ) 10 MG tablet Take 1 tablet (10 mg total) by mouth every 6 (six) hours as needed for nausea or vomiting. 30 tablet 1   valsartan -hydrochlorothiazide  (DIOVAN -HCT) 160-12.5 MG tablet Take 1 tablet by mouth daily.     vitamin B-12 (CYANOCOBALAMIN ) 500 MCG tablet Take 500 mcg by mouth daily.     No current facility-administered medications for this visit.    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  2016-2018 He then had multiple other TURBTs with intermittent adherence/tolerability to BCG     05/27/2014 Procedure   Repeat TURBT, path again with LgTa. Only completed 2/6 BCG treatments, then lost to follow-up.    06/09/2015 Procedure   Repeat TURBT with 3 papillary tumors, pathology with LgTa and CIS.    06/2015 Imaging   CT A/P with diffuse bladder wall thickening, no definitive evidence of metastatic disease    07/21/2015 Procedure   Repeat TURBT with path showing high grade dysplasia. Again scheduled for BCG but only completed 1-2 doses.    10/19/2016 Procedure   Repeat TURBT with low grade papillary urothelial carcinoma/CIS. Received 4/6 doses of BCG. Lost to follow-up.    03/09/2019 Imaging   CT CAP with LUL 2cm GGO, minimal right urinary bladder wall thickening of 4mm    01/22/2020 Imaging   CT A/P in the  ED for N/V/D/weight loss. Bladder thickening more pronounced at 7mm.    2020-07-18 Imaging   CT urogram for hematuria showed new right lateral masslike thickening of bladder wall, measure 8mm. No evidence metastatic disease.    08/13/2020 Procedure   Repeat TURBT with invasive high-grade urothelial carcinoma with nonfocal invasion into lamina propria (CIS present), second specimen with invasive high-grade urothelial carcinoma extensively involving lamina propria, with one fragment showing involvement of muscle bundles which are equivocal for muscularis mucosae or muscularis propria (CIS again present).    09/19/2020 Procedure   Repeat TURBT for re-resection with invasive high-grade urothelial carcinoma with non-focal lamina propria invasion and CIS, no involvement of muscularis propria    11/20/2020 - 09/17/2021 Chemotherapy   C1 - 8 Pembrolizumab    12/05/2022 Imaging   MRI pelvis wo contrast -Increased size of right posterolateral bladder wall mass, now measuring up to 3.2 cm, previously 1.2 cm, with extramural extension of the mass encasing the distal right ureter and resulting in upstream right ureteral dilation. The extravesicular extension of the bladder mass also is in close contact with the vas deferens near the seminal vesicles for which abutment  or invasion cannot be excluded.     01/03/2023 Imaging   CT AP -Increasing right posterior lateral bladder wall thickening, concerning for disease progression. New right ureteral obstruction associated hydroureteronephrosis.  -Multiple subcentimeter hypoattenuating foci within the liver, favored to be benign given stability since 2016.   CT Chest: No intrathoracic metastasis with stable findings since December 28.     01/17/2023 PET scan   PET from [facility] - Thickening of the right posterolateral bladder wall, similar to prior CT dated 01/03/2023.  - Minimally FDG avid bilateral inguinal lymph nodes which do not appear pathologically enlarged or  abnormal in appearance. These are favored to be reactive.  -There is a possible mildly hypermetabolic nodule along the posterior wall of the rectum which is not completely characterized on this study. Suggest correlation with colonoscopy, which patient has scheduled 01/20/2023.    02/07/2023 Pathology Results   A: Ureter margin, right distal, biopsy - Benign ureter with chronic inflammation - Negative for malignancy   B: Ureter margin, left distal, biopsy - Focal atypia, worrisome for focal involvement by urothelial carcinoma in situ   C: Bladder and prostate, cystoprostatectomy   BLADDER - Multifocal invasive high grade papillary urothelial carcinoma              - Dominant tumor is in right posterior upper bladder wall, 3.0 cm, invasive through muscularis propria with macroscopic invasion of peri-vesicle adipose tissue              - Satellite tumor in left bladder extending into left ureter, 0.9 cm, invasive into lamina propria - Extensive multifocal high grade papillary urothelial carcinoma and urothelial carcinoma in situ present in all random sections of the bladder mucosa (anterior, posterior, dome, trigone, right lateral, left lateral walls) away from invasive cancers - 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 (contiguous with carcinoma in situ of trigone), distal margin uninvolved - Margins of resection negative for invasive carcinoma - Left ureter margin of this specimen shows urothelial carcinoma in situ, superceded by additional specimens, final left ureter margin is in specimen F, which shows patchy atypia 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 - See synoptic report    PROSTATE - 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 and involves prostatic ducts - Prostatic stroma with no evidence of involvement by urothelial carcinoma, negative for prostate carcinoma - Prostatic urethral distal margin negative   D: Lymph node, right pelvic, biopsy - Five lymph nodes  with no evidence of malignancy (0/5)    E: Lymph node, left pelvic, biopsy - Metastatic carcinoma present in one of five lymph nodes (1/5), size of metastatic deposit 1.4 mm, without extranodal extension   F: True ureteral margin, left distal, biopsy - 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 - Negative for invasive carcinoma   G: True ureteral margin, right distal, biopsy - Benign ureter with chronic inflammation - Negative for malignancy  SPECIMEN    Procedure:    Radical cystoprostatectomy   TUMOR    Tumor Site:    Right lateral wall     Tumor Site:    Left lateral wall     Tumor Site:    Posterior wall     Histologic Type:    Urothelial carcinoma, invasive (conventional)     Histologic Grade:    High-grade     Tumor Size:    Greatest Dimension (Centimeters): 3.0 cm       Additional Dimension (Centimeters):    2.9  cm       Additional Dimension (Centimeters):    2.8 cm     Tumor Extent:    Invades perivesical soft tissue microscopically     Lymphatic and / or Vascular Invasion:    Not identified     Tumor Configuration:    Papillary     Tumor Configuration:    Solid / nodule     Tumor Configuration:    Flat     Treatment Effect Post Neoadjuvant Chemotherapy:    Weak and no response: residual cancer cells occupying greater than or equal to 50% of the tumor bed or absence of regressive changes (TRG3)     Tumor Comment:    Appears to have received 1 dose of pembrolizumab, no apparent histologic response.   MARGINS    Margin Status for Invasive Tumor:    All margins negative for invasive tumor       Closest Margin(s) to Invasive tumor:    Soft tissue: right peri-vesicular margin       Distance from Invasive Tumor to Closest Margin:    13 mm     Margin Status for Carcinoma in Situ / Noninvasive Papillary Urothelial Carcinoma:    Carcinoma in situ / noninvasive papillary urothelial carcinoma present at margin       Margin(s) Involved  by Carcinoma in Situ / Noninvasive Papillary Urothelial Carcinoma:    Left ureteral   REGIONAL LYMPH NODES     Regional Lymph Node Status:          :    Tumor present in regional lymph node(s)         Number of Lymph Nodes with Tumor:    1         Size of Largest Nodal Metastatic Deposit:    0.14 cm         Nodal Site with Largest Metastatic Deposit:    Left pelvic         Size of Largest Lymph Node with Tumor:    0.7 cm         Largest Lymph Node with Tumor:    left pelvic         Extranodal Extension (ENE):    Not identified       Number of Lymph Nodes Examined:    10   pTNM CLASSIFICATION (AJCC 8th Edition)     Reporting of pT, pN, and (when applicable) pM categories is based on information available to the pathologist at the time the report is issued. As per the AJCC (Chapter 1, 8th Ed.) it is the managing physician's responsibility to establish the final pathologic stage based upon all pertinent information, including but potentially not limited to this pathology report.     Modified Classification:    y     pT Category:    pT3a     T Suffix:    (m)     pN Category:    pN1    02/20/2023 Procedure   Colonoscopy Impression:    - The examined portion of the ileum was normal.                         - The entire examined colon is normal on direct and retroflexion views.                         - Views were limited by liquid stool throughout the colon and  difficulty suctioning the stool.                         - No specimens collected.    03/28/2023 - 03/28/2023 Chemotherapy   Patient is on Treatment Plan : BLADDER Cisplatin D1 + Gemcitabine D1,8 q21d x 4 Cycles     04/28/2023 -  Chemotherapy   Patient is on Treatment Plan : BLADDER Nivolumab  (240) q14d       Past Medical History:  Diagnosis Date   Allergy    Anxiety    Arthritis    Asthma    Bladder cancer (HCC) dx'd 10/2013   recurrent   Chronic gastritis    Chronic low back pain    Colon cancer (HCC) dx'd 09/2011   COPD  (chronic obstructive pulmonary disease) (HCC)    Depression    Diverticulosis    Dysuria    GERD (gastroesophageal reflux disease)    History of acute pancreatitis    due to SIRS   History of adenomatous polyp of colon    04-04-2015  last colonoscopy w/ multiple polyps--  tubular adenomas, beign polypoid colonic mucous   History of bladder cancer monitory by  dr Ceil   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   History of colon cancer oncologist-- dr cloretta--  dx  with Hereditary non-polyposis colon cancer syndrome,  MLH1 gene mutation--     dx june 2013--  stage II, T3,N0--  s/p  transverse colectomy--  completed 2 cycles  chemotherapy--  NO RECURRENCE in clinical remission   Hypertension    Lactose intolerance    Lower urinary tract symptoms (LUTS)    Lynch syndrome    MLH1 gene mutation    Short of breath on exertion    Wears glasses      REVIEW OF SYSTEMS:   All relevant systems were reviewed with the patient and are negative.   VITALS:  There were no vitals taken for this visit.  Wt Readings from Last 3 Encounters:  03/14/23 170 lb 6.4 oz (77.3 kg)  03/03/23 172 lb 9.6 oz (78.3 kg)  02/23/23 179 lb (81.2 kg)    There is no height or weight on file to calculate BMI.  Performance status (ECOG): {CHL ONC [id number]  PHYSICAL EXAM:   GENERAL:alert, no distress and comfortable SKIN: skin color normal, no rashes  EYES: normal, sclera clear OROPHARYNX: no exudate, no erythema    NECK: supple,  non-tender, without nodularity LYMPH:  no palpable cervical lymphadenopathy LUNGS: clear to auscultation with normal breathing effort.  No wheeze or rales HEART: regular rate & rhythm and no murmurs and no lower extremity edema ABDOMEN: abdomen soft, non-tender and nondistended Musculoskeletal: no edema NEURO: alert, fluent speech, no focal motor/sensory deficits.  Strength and sensation equal bilaterally.  LABORATORY DATA:  I have reviewed the data as  listed    Component Value Date/Time   NA 142 03/18/2023 0754   NA 140 01/29/2014 0937   K 4.4 03/18/2023 0754   K 4.1 01/29/2014 0937   CL 107 03/18/2023 0754   CO2 29 03/18/2023 0754   CO2 26 01/29/2014 0937   GLUCOSE 94 03/18/2023 0754   GLUCOSE 93 01/29/2014 0937   BUN 27 (H) 03/18/2023 0754   BUN 16.1 01/29/2014 0937   CREATININE 2.07 (H) 03/18/2023 0754   CREATININE 1.0 01/29/2014 0937   CALCIUM 8.6 (L) 03/18/2023 0754   CALCIUM 9.0 01/29/2014 0937   PROT 6.3 (L) 03/18/2023  0754   PROT 6.6 01/29/2014 0937   ALBUMIN 3.7 03/18/2023 0754   ALBUMIN 3.2 (L) 01/29/2014 0937   AST 10 (L) 03/18/2023 0754   AST 27 01/29/2014 0937   ALT 7 03/18/2023 0754   ALT 29 01/29/2014 0937   ALKPHOS 53 03/18/2023 0754   ALKPHOS 86 01/29/2014 0937   BILITOT 0.4 03/18/2023 0754   BILITOT 0.36 01/29/2014 0937   GFRNONAA 35 (L) 03/18/2023 0754   GFRAA 57 (L) 09/20/2014 1352    No results found for: SPEP, UPEP  Lab Results  Component Value Date   WBC 5.8 03/18/2023   NEUTROABS 2.8 03/18/2023   HGB 12.6 (L) 03/18/2023   HCT 39.4 03/18/2023   MCV 101.0 (H) 03/18/2023   PLT 134 (L) 03/18/2023      Chemistry      Component Value Date/Time   NA 142 03/18/2023 0754   NA 140 01/29/2014 0937   K 4.4 03/18/2023 0754   K 4.1 01/29/2014 0937   CL 107 03/18/2023 0754   CO2 29 03/18/2023 0754   CO2 26 01/29/2014 0937   BUN 27 (H) 03/18/2023 0754   BUN 16.1 01/29/2014 0937   CREATININE 2.07 (H) 03/18/2023 0754   CREATININE 1.0 01/29/2014 0937      Component Value Date/Time   CALCIUM 8.6 (L) 03/18/2023 0754   CALCIUM 9.0 01/29/2014 0937   ALKPHOS 53 03/18/2023 0754   ALKPHOS 86 01/29/2014 0937   AST 10 (L) 03/18/2023 0754   AST 27 01/29/2014 0937   ALT 7 03/18/2023 0754   ALT 29 01/29/2014 0937   BILITOT 0.4 03/18/2023 0754   BILITOT 0.36 01/29/2014 0937       RADIOGRAPHIC STUDIES: I have personally reviewed the radiological images as listed and agreed with the findings  in the report. No results found.

## 2023-04-25 ENCOUNTER — Inpatient Hospital Stay: Payer: 59

## 2023-04-25 ENCOUNTER — Ambulatory Visit: Admission: RE | Admit: 2023-04-25 | Payer: 59 | Source: Ambulatory Visit

## 2023-04-25 ENCOUNTER — Ambulatory Visit: Payer: 59

## 2023-04-25 DIAGNOSIS — C678 Malignant neoplasm of overlapping sites of bladder: Secondary | ICD-10-CM | POA: Insufficient documentation

## 2023-04-25 DIAGNOSIS — C775 Secondary and unspecified malignant neoplasm of intrapelvic lymph nodes: Secondary | ICD-10-CM | POA: Insufficient documentation

## 2023-04-25 DIAGNOSIS — Z1509 Genetic susceptibility to other malignant neoplasm: Secondary | ICD-10-CM | POA: Insufficient documentation

## 2023-04-25 DIAGNOSIS — C7989 Secondary malignant neoplasm of other specified sites: Secondary | ICD-10-CM | POA: Insufficient documentation

## 2023-04-25 NOTE — Assessment & Plan Note (Deleted)
Adjuvant radiation as planned by Dr. Kathrynn Running  Follow up with CT CAP about 4-6 weeks post treatment

## 2023-04-26 ENCOUNTER — Ambulatory Visit: Payer: 59

## 2023-04-26 ENCOUNTER — Other Ambulatory Visit: Payer: Self-pay

## 2023-04-27 ENCOUNTER — Other Ambulatory Visit: Payer: Self-pay

## 2023-04-27 ENCOUNTER — Ambulatory Visit: Payer: 59

## 2023-04-28 ENCOUNTER — Inpatient Hospital Stay: Payer: 59

## 2023-04-28 ENCOUNTER — Ambulatory Visit: Payer: 59

## 2023-04-28 ENCOUNTER — Other Ambulatory Visit: Payer: 59

## 2023-04-28 ENCOUNTER — Other Ambulatory Visit: Payer: Self-pay

## 2023-04-28 NOTE — Progress Notes (Signed)
Patient did not make it to my appointment this week.    Please let his daughter know.  Based on his renal function, would not recommend chemotherapy at this time.  I recommend proceed with radiation as planned by radiation oncologist.  Patient may come to see me in a few weeks when able to discuss potential use of immunotherapy after he completed radiation. Thank you.

## 2023-04-29 ENCOUNTER — Ambulatory Visit: Payer: 59

## 2023-04-29 ENCOUNTER — Other Ambulatory Visit: Payer: Self-pay

## 2023-05-02 ENCOUNTER — Ambulatory Visit: Payer: 59

## 2023-05-03 ENCOUNTER — Ambulatory Visit: Payer: 59

## 2023-05-03 ENCOUNTER — Other Ambulatory Visit: Payer: Self-pay

## 2023-05-04 ENCOUNTER — Ambulatory Visit: Payer: 59

## 2023-05-04 NOTE — Progress Notes (Deleted)
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    The patient understands the plans discussed today and is in agreement with them.  He knows to contact our office if he develops concerns prior to his next appointment.  [name], MD  [facility] - A DEPT OF [facility] [address] [place] [address] Dept: [phone] Dept Fax: [phone]   No orders of the defined types were placed in this encounter.     CHIEF COMPLAINT:  CC: ***  Current Treatment:  ***  INTERVAL HISTORY:  Todd [name] is here today for repeat clinical assessment. He denies fevers or chills. He denies pain. His appetite is good. His weight {Weight change:10426}.  I have reviewed the past medical history, past surgical history, social history and family history with the patient and they are unchanged from previous note.  ALLERGIES:  is allergic to clindamycin/lincomycin, milk-related compounds, and hydrocodone.  MEDICATIONS:  Current Outpatient Medications  Medication Sig Dispense Refill   albuterol (PROVENTIL HFA;VENTOLIN HFA) 108 (90 BASE) MCG/ACT inhaler Inhale 1 puff into the lungs every 6 (six) hours as needed for wheezing or shortness of breath.     budesonide-formoterol (SYMBICORT) 160-4.5 MCG/ACT inhaler Inhale 2 puffs into the lungs 2 (two) times daily as needed (for respiratory flares).     clonazePAM (KLONOPIN) 1 MG tablet Take 1 mg by mouth 3 (three) times daily as needed for anxiety. (Patient not  taking: Reported on 03/03/2023)  0   gabapentin (NEURONTIN) 100 MG capsule Take 100 mg by mouth 3 (three) times daily.     Ibuprofen (ADVIL) 200 MG CAPS Take 200-800 mg by mouth every 6 (six) hours as needed (for pain or headaches).     Na Sulfate-K Sulfate-Mg Sulf 17.5-3.13-1.6 GM/177ML SOLN Suprep (no substitutions)-TAKE AS DIRECTED. 354 mL 0   ondansetron (ZOFRAN) 8 MG tablet Take 1 tablet (8 mg total) by mouth every 8 (eight) hours as needed for nausea or vomiting. 30 tablet 1   oxyCODONE (ROXICODONE) 15 MG immediate release tablet Take 15 mg by mouth every 6 (six) hours as needed for pain.     oxyCODONE (ROXICODONE) 15 MG immediate release tablet Take 1 tablet (15 mg total) by mouth every 6 (six) hours as needed for pain. 30 tablet 0   pantoprazole (PROTONIX) 40 MG tablet Take 1 tablet (40 mg total) by mouth 2 (two) times daily. (Patient taking differently: Take 40 mg by mouth daily as needed (for reflux).) 180 tablet 0   prochlorperazine (COMPAZINE) 10 MG tablet Take 1 tablet (10 mg total) by mouth every 6 (six) hours as needed for nausea or vomiting. 30 tablet 1   valsartan-hydrochlorothiazide (DIOVAN-HCT) 160-12.5 MG tablet Take 1 tablet by mouth daily.     vitamin B-12 (CYANOCOBALAMIN) 500 MCG tablet Take 500 mcg by mouth daily.     No current facility-administered medications for this visit.    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  2016-2018 He then had multiple other TURBTs with intermittent adherence/tolerability to BCG     05/27/2014 Procedure   Repeat TURBT, path again with LgTa. Only completed 2/6 BCG treatments, then lost to follow-up.    06/09/2015 Procedure   Repeat TURBT with 3 papillary tumors, pathology with LgTa and CIS.    06/2015 Imaging   CT A/P with diffuse bladder  wall thickening, no definitive evidence of metastatic disease    07/21/2015 Procedure   Repeat TURBT with path showing high grade dysplasia. Again scheduled for BCG but only completed 1-2 doses.    10/19/2016 Procedure   Repeat TURBT with low grade papillary urothelial carcinoma/CIS. Received 4/6 doses of BCG. Lost to follow-up.    03/09/2019 Imaging   CT CAP with LUL 2cm GGO, minimal right urinary bladder wall thickening of 4mm    01/22/2020 Imaging   CT A/P in the ED for N/V/D/weight loss. Bladder thickening more pronounced at 7mm.    28-Jun-2020 Imaging   CT urogram for hematuria showed new right lateral masslike thickening of bladder wall, measure 8mm. No evidence metastatic disease.    08/13/2020 Procedure   Repeat TURBT with invasive high-grade urothelial carcinoma with nonfocal invasion into lamina propria (CIS present), second specimen with invasive high-grade urothelial carcinoma extensively involving lamina propria, with one fragment showing involvement of muscle bundles which are equivocal for muscularis mucosae or muscularis propria (CIS again present).    09/19/2020 Procedure   Repeat TURBT for re-resection with invasive high-grade urothelial carcinoma with non-focal lamina propria invasion and CIS, no involvement of muscularis propria    11/20/2020 - 09/17/2021 Chemotherapy   C1 - 8 Pembrolizumab    12/05/2022 Imaging   MRI pelvis wo contrast -Increased size of right posterolateral bladder wall mass, now measuring up to 3.2 cm, previously 1.2 cm, with extramural extension of the mass encasing the distal right ureter and resulting in upstream right ureteral dilation. The extravesicular extension of the bladder mass also is in close contact with the vas deferens near the seminal vesicles for which abutment or invasion cannot be excluded.     01/03/2023 Imaging   CT AP -Increasing right posterior lateral bladder wall thickening, concerning for disease progression. New right ureteral  obstruction associated hydroureteronephrosis.  -Multiple subcentimeter hypoattenuating foci within the liver, favored to be benign given stability since 2016.   CT Chest: No intrathoracic metastasis with stable findings since December 28.     01/17/2023 PET scan   PET from [facility] - Thickening of the right posterolateral bladder wall, similar to prior CT dated 01/03/2023.  - Minimally FDG avid bilateral inguinal lymph nodes which do not appear pathologically enlarged or abnormal in appearance. These are favored to be reactive.  -There is a possible mildly hypermetabolic nodule along the posterior wall of the rectum which is not completely characterized on this study. Suggest correlation with colonoscopy, which patient has scheduled 01/20/2023.    02/07/2023 Pathology Results   A: Ureter margin, right distal, biopsy - Benign ureter with chronic inflammation - Negative for malignancy   B: Ureter margin, left distal, biopsy - Focal atypia, worrisome for focal involvement by urothelial carcinoma in situ   C: Bladder and prostate, cystoprostatectomy   BLADDER - Multifocal invasive high grade papillary urothelial carcinoma              - Dominant tumor is in right posterior upper bladder wall, 3.0 cm, invasive  through muscularis propria with macroscopic invasion of peri-vesicle adipose tissue              - Satellite tumor in left bladder extending into left ureter, 0.9 cm, invasive into lamina propria - Extensive multifocal high grade papillary urothelial carcinoma and urothelial carcinoma in situ present in all random sections of the bladder mucosa (anterior, posterior, dome, trigone, right lateral, left lateral walls) away from invasive cancers - 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 (contiguous with carcinoma in situ of trigone), distal margin uninvolved - Margins of resection negative for invasive carcinoma - Left ureter margin of this specimen shows urothelial carcinoma in  situ, superceded by additional specimens, final left ureter margin is in specimen F, which shows patchy atypia 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 - See synoptic report    PROSTATE - 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 and involves prostatic ducts - Prostatic stroma with no evidence of involvement by urothelial carcinoma, negative for prostate carcinoma - Prostatic urethral distal margin negative   D: Lymph node, right pelvic, biopsy - Five lymph nodes with no evidence of malignancy (0/5)    E: Lymph node, left pelvic, biopsy - Metastatic carcinoma present in one of five lymph nodes (1/5), size of metastatic deposit 1.4 mm, without extranodal extension   F: True ureteral margin, left distal, biopsy - 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 - Negative for invasive carcinoma   G: True ureteral margin, right distal, biopsy - Benign ureter with chronic inflammation - Negative for malignancy  SPECIMEN    Procedure:    Radical cystoprostatectomy   TUMOR    Tumor Site:    Right lateral wall     Tumor Site:    Left lateral wall     Tumor Site:    Posterior wall     Histologic Type:    Urothelial carcinoma, invasive (conventional)     Histologic Grade:    High-grade     Tumor Size:    Greatest Dimension (Centimeters): 3.0 cm       Additional Dimension (Centimeters):    2.9 cm       Additional Dimension (Centimeters):    2.8 cm     Tumor Extent:    Invades perivesical soft tissue microscopically     Lymphatic and / or Vascular Invasion:    Not identified     Tumor Configuration:    Papillary     Tumor Configuration:    Solid / nodule     Tumor Configuration:    Flat     Treatment Effect Post Neoadjuvant Chemotherapy:    Weak and no response: residual cancer cells occupying greater than or equal to 50% of the tumor bed or absence of regressive changes (TRG3)     Tumor Comment:    Appears to have received 1 dose of  pembrolizumab, no apparent histologic response.   MARGINS    Margin Status for Invasive Tumor:    All margins negative for invasive tumor       Closest Margin(s) to Invasive tumor:    Soft tissue: right peri-vesicular margin       Distance from Invasive Tumor to Closest Margin:    13 mm     Margin Status for Carcinoma in Situ / Noninvasive Papillary Urothelial Carcinoma:    Carcinoma in situ / noninvasive papillary urothelial carcinoma present at margin       Margin(s) Involved by Carcinoma in Situ /  Noninvasive Papillary Urothelial Carcinoma:    Left ureteral   REGIONAL LYMPH NODES     Regional Lymph Node Status:          :    Tumor present in regional lymph node(s)         Number of Lymph Nodes with Tumor:    1         Size of Largest Nodal Metastatic Deposit:    0.14 cm         Nodal Site with Largest Metastatic Deposit:    Left pelvic         Size of Largest Lymph Node with Tumor:    0.7 cm         Largest Lymph Node with Tumor:    left pelvic         Extranodal Extension (ENE):    Not identified       Number of Lymph Nodes Examined:    10   pTNM CLASSIFICATION (AJCC 8th Edition)     Reporting of pT, pN, and (when applicable) pM categories is based on information available to the pathologist at the time the report is issued. As per the AJCC (Chapter 1, 8th Ed.) it is the managing physician's responsibility to establish the final pathologic stage based upon all pertinent information, including but potentially not limited to this pathology report.     Modified Classification:    y     pT Category:    pT3a     T Suffix:    (m)     pN Category:    pN1    02/20/2023 Procedure   Colonoscopy Impression:    - The examined portion of the ileum was normal.                         - The entire examined colon is normal on direct and retroflexion views.                         - Views were limited by liquid stool throughout the colon and difficulty suctioning the stool.                          - No specimens collected.    03/28/2023 - 03/28/2023 Chemotherapy   Patient is on Treatment Plan : BLADDER Cisplatin D1 + Gemcitabine D1,8 q21d x 4 Cycles     05/31/2023 -  Chemotherapy   Patient is on Treatment Plan : BLADDER Nivolumab (240) q14d         REVIEW OF SYSTEMS:   All relevant systems were reviewed with the patient and are negative.   VITALS:  There were no vitals taken for this visit.  Wt Readings from Last 3 Encounters:  03/14/23 170 lb 6.4 oz (77.3 kg)  03/03/23 172 lb 9.6 oz (78.3 kg)  02/23/23 179 lb (81.2 kg)    There is no height or weight on file to calculate BMI.  Performance status (ECOG): {CHL ONC [id number]  PHYSICAL EXAM:   GENERAL:alert, no distress and comfortable SKIN: skin color normal, no rashes  EYES: normal, sclera clear OROPHARYNX: no exudate, no erythema    NECK: supple,  non-tender, without nodularity LYMPH:  no palpable cervical lymphadenopathy LUNGS: clear to auscultation with normal breathing effort.  No wheeze or rales HEART: regular rate & rhythm and no murmurs and no lower extremity edema ABDOMEN: abdomen  soft, non-tender and nondistended Musculoskeletal: no edema NEURO: alert, fluent speech, no focal motor/sensory deficits.  Strength and sensation equal bilaterally.  LABORATORY DATA:  I have reviewed the data as listed    Component Value Date/Time   NA 142 03/18/2023 0754   NA 140 01/29/2014 0937   K 4.4 03/18/2023 0754   K 4.1 01/29/2014 0937   CL 107 03/18/2023 0754   CO2 29 03/18/2023 0754   CO2 26 01/29/2014 0937   GLUCOSE 94 03/18/2023 0754   GLUCOSE 93 01/29/2014 0937   BUN 27 (H) 03/18/2023 0754   BUN 16.1 01/29/2014 0937   CREATININE 2.07 (H) 03/18/2023 0754   CREATININE 1.0 01/29/2014 0937   CALCIUM 8.6 (L) 03/18/2023 0754   CALCIUM 9.0 01/29/2014 0937   PROT 6.3 (L) 03/18/2023 0754   PROT 6.6 01/29/2014 0937   ALBUMIN 3.7 03/18/2023 0754   ALBUMIN 3.2 (L) 01/29/2014 0937   AST 10 (L) 03/18/2023 0754    AST 27 01/29/2014 0937   ALT 7 03/18/2023 0754   ALT 29 01/29/2014 0937   ALKPHOS 53 03/18/2023 0754   ALKPHOS 86 01/29/2014 0937   BILITOT 0.4 03/18/2023 0754   BILITOT 0.36 01/29/2014 0937   GFRNONAA 35 (L) 03/18/2023 0754   GFRAA 57 (L) 09/20/2014 1352    No results found for: "SPEP", "UPEP"  Lab Results  Component Value Date   WBC 5.8 03/18/2023   NEUTROABS 2.8 03/18/2023   HGB 12.6 (L) 03/18/2023   HCT 39.4 03/18/2023   MCV 101.0 (H) 03/18/2023   PLT 134 (L) 03/18/2023      Chemistry      Component Value Date/Time   NA 142 03/18/2023 0754   NA 140 01/29/2014 0937   K 4.4 03/18/2023 0754   K 4.1 01/29/2014 0937   CL 107 03/18/2023 0754   CO2 29 03/18/2023 0754   CO2 26 01/29/2014 0937   BUN 27 (H) 03/18/2023 0754   BUN 16.1 01/29/2014 0937   CREATININE 2.07 (H) 03/18/2023 0754   CREATININE 1.0 01/29/2014 0937      Component Value Date/Time   CALCIUM 8.6 (L) 03/18/2023 0754   CALCIUM 9.0 01/29/2014 0937   ALKPHOS 53 03/18/2023 0754   ALKPHOS 86 01/29/2014 0937   AST 10 (L) 03/18/2023 0754   AST 27 01/29/2014 0937   ALT 7 03/18/2023 0754   ALT 29 01/29/2014 0937   BILITOT 0.4 03/18/2023 0754   BILITOT 0.36 01/29/2014 0937       RADIOGRAPHIC STUDIES: I have personally reviewed the radiological images as listed and agreed with the findings in the report. No results found.

## 2023-05-05 ENCOUNTER — Ambulatory Visit: Payer: 59

## 2023-05-05 ENCOUNTER — Other Ambulatory Visit: Payer: Self-pay

## 2023-05-05 ENCOUNTER — Inpatient Hospital Stay: Payer: 59

## 2023-05-05 ENCOUNTER — Other Ambulatory Visit: Payer: 59

## 2023-05-05 DIAGNOSIS — C679 Malignant neoplasm of bladder, unspecified: Secondary | ICD-10-CM

## 2023-05-05 DIAGNOSIS — Z9889 Other specified postprocedural states: Secondary | ICD-10-CM

## 2023-05-06 ENCOUNTER — Ambulatory Visit: Payer: 59

## 2023-05-07 ENCOUNTER — Other Ambulatory Visit: Payer: Self-pay

## 2023-05-09 ENCOUNTER — Ambulatory Visit: Payer: 59 | Admitting: Radiation Oncology

## 2023-05-09 ENCOUNTER — Ambulatory Visit
Admission: RE | Admit: 2023-05-09 | Discharge: 2023-05-09 | Discharge status: Home / Self Care | Payer: 59 | Source: Ambulatory Visit | Attending: Radiation Oncology | Admitting: Radiation Oncology

## 2023-05-09 ENCOUNTER — Ambulatory Visit: Payer: 59

## 2023-05-09 ENCOUNTER — Other Ambulatory Visit: Payer: Self-pay

## 2023-05-09 ENCOUNTER — Ambulatory Visit
Admission: RE | Admit: 2023-05-09 | Discharge: 2023-05-09 | Disposition: A | Discharge status: Home / Self Care | Payer: 59 | Source: Ambulatory Visit | Attending: Radiation Oncology | Admitting: Radiation Oncology

## 2023-05-09 ENCOUNTER — Other Ambulatory Visit: Payer: 59

## 2023-05-09 DIAGNOSIS — C679 Malignant neoplasm of bladder, unspecified: Secondary | ICD-10-CM | POA: Insufficient documentation

## 2023-05-09 DIAGNOSIS — C775 Secondary and unspecified malignant neoplasm of intrapelvic lymph nodes: Secondary | ICD-10-CM | POA: Insufficient documentation

## 2023-05-09 DIAGNOSIS — Z1509 Genetic susceptibility to other malignant neoplasm: Secondary | ICD-10-CM | POA: Diagnosis not present

## 2023-05-09 DIAGNOSIS — C678 Malignant neoplasm of overlapping sites of bladder: Secondary | ICD-10-CM | POA: Diagnosis present

## 2023-05-09 DIAGNOSIS — C7989 Secondary malignant neoplasm of other specified sites: Secondary | ICD-10-CM | POA: Diagnosis not present

## 2023-05-09 LAB — RAD ONC ARIA SESSION SUMMARY
Course Elapsed Days: 0
Plan Fractions Treated to Date: 1
Plan Prescribed Dose Per Fraction: 1.8 Gy
Plan Total Fractions Prescribed: 25
Plan Total Prescribed Dose: 45 Gy
Reference Point Dosage Given to Date: 1.8 Gy
Reference Point Session Dosage Given: 1.8 Gy
Session Number: 1

## 2023-05-10 ENCOUNTER — Ambulatory Visit
Admission: RE | Admit: 2023-05-10 | Discharge: 2023-05-10 | Disposition: A | Discharge status: Home / Self Care | Payer: 59 | Source: Ambulatory Visit | Attending: Radiation Oncology | Admitting: Radiation Oncology

## 2023-05-10 ENCOUNTER — Other Ambulatory Visit: Payer: Self-pay

## 2023-05-10 ENCOUNTER — Ambulatory Visit: Payer: 59

## 2023-05-10 DIAGNOSIS — C678 Malignant neoplasm of overlapping sites of bladder: Secondary | ICD-10-CM | POA: Diagnosis not present

## 2023-05-10 LAB — RAD ONC ARIA SESSION SUMMARY
Course Elapsed Days: 1
Plan Fractions Treated to Date: 2
Plan Prescribed Dose Per Fraction: 1.8 Gy
Plan Total Fractions Prescribed: 25
Plan Total Prescribed Dose: 45 Gy
Reference Point Dosage Given to Date: 3.6 Gy
Reference Point Session Dosage Given: 1.8 Gy
Session Number: 2

## 2023-05-10 NOTE — Progress Notes (Unsigned)
Patient Care Team: System, Provider Not In as PCP - General  Clinic Day:  05/12/2023  Referring physician: Marilynne Drivers, MD  ASSESSMENT & PLAN:   Assessment & Plan:  Todd [name] is a 68 y.o.male with history of NMIBC being [facility] urothelial carcinoma.   Previous history reviewed.  Patient has longstanding history of non-muscle invasive bladder cancer underwent multiple TURBTs since 2015.  He has received BCG, and pembrolizumab in the setting.  Last pembrolizumab was in 09/2021.  PET/CT in October 2024 showed thickening of the right posterolateral bladder wall.  Due to findings of increasing right posterior lateral bladder wall thickening and hydronephrosi suspect higher stage disease patient underwent right radical cystoprostatectomy on 02/07/23 at [facility].  Final pathology showed pT3N1 high-grade urothelial carcinoma invades perivesical soft tissue.  Residual cancer cells occupying greater than or equal to 50% of tumor bed or absence of regressive changes.  All margins negative.  1 left pelvic lymph node positive positive for malignancy.  Margins positive for CIS.  I have referred patient for evaluation for adjuvant radiation with concurrent chemotherapy.  Unfortunately, patient has not been able to follow-up after initial consultation and resulted hospitalization for the past few months.  Report of having chest pain, elevated troponin, followed by kidney infection at [facility].  Most recent CT abdomen and pelvis from [facility] did not show metastases.  Report of subcentimeter hepatic foci too small to characterize.  His previous imaging has reported multiple hepatic cysts in the past.  I recommend continued monitoring.  He has started adjuvant radiation this week.  Given his borderline renal insufficiency, borderline performance status, cisplatin or cytotoxic systemic therapy is not appropriate at this time.  Adjuvant immunotherapy can be considered and also pending on his functional  status.  There are potential risks and benefit, potential significant side effects at times. We can reassess his functional status after completion of radiation.  We again discussed his pathology from [facility] showed lymph node involvement, margins were negative for invasive tumor.  CT scan showed from [facility] in December showed negative for signs of local recurrence.  Discussed continued surveillance in the interim.  He has been over 3 months since his PET/CT, and we will obtain a CT chest.  Once completed radiation, continue surveillance imaging about 3 months postradiation with CT chest, abdomen and pelvis.  [name] is having hard time adjusting after his surgery and does endorse anxiety and feeling anxious a lot of times.  He is still frustrated having to go through a surgery with urostomy in place.  Report of limited support as both daughters are working full-time and not a lot of support at home can help him.  He is having hard time has to deal with urostomy for the rest of his life.  I have referred him to our Child psychotherapist for evaluation and support.  He has no signs of active infection at this time.  Temperature was normal, no signs of hypotension or tachycardia.  Recommend follow-up if having new signs and symptoms of infection as indicated.  Diagnosis: pT3N1cM0 stage IIIA HG UC. 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 Kathrynn Running started this week Will obtain nonurgent CT chest Referral to social work for community resources and support to patient and family  CBC, CMP today.  Low serum vitamin B12 History of ileal conduit Has been B12 over few months. If persistently low, will start B12 injection  Lynch syndrome MLH1  positive Lynch syndrome Recommend testing first degree family Daughter report having tested.  Return in about early March, earlier if any concerns.  Total time spent 72 minutes.  The patient understands the plans discussed today and is in agreement with them.  He knows to contact our office if he develops concerns prior to his next appointment.  [name], MD  [facility] - A DE[facility] [address] [place] [address] Dept: [phone] Dept Fax: [phone]   Orders Placed This Encounter  Procedures   Ambulatory referral to Social Work    Referral Priority:   Routine    Referral Type:   Consultation    Referral Reason:   Specialty Services Required    Number of Visits Requested:   1      CHIEF COMPLAINT:  CC: Bladder cancer  Current Treatment: Adjuvant radiation  INTERVAL HISTORY:  Todd [name] is here today for repeat clinical assessment.  He went to [facility] for chest pain. Report troponin was high and had kidney infection for a month. Report fever, chills, but report from anxiety and not a cold. Report stomach pain around the ostomy and urine is coming out normally. Report of bloody urine 3 days ago. Some stool incontinence. He has gained 11 lb per daughter and appetite has improved. He lives in [place] with a daughter. No chest pressure.  Daughters are concerning of his cognitive function, did report of 1 side is weaker, still using a walker.  I have reviewed the past medical history, past surgical history, social history and family history with the patient and they are unchanged from previous note.  ALLERGIES:  is allergic to clindamycin/lincomycin, milk-related compounds, and hydrocodone.  MEDICATIONS:  Current Outpatient Medications  Medication Sig Dispense Refill   albuterol (PROVENTIL HFA;VENTOLIN HFA) 108 (90 BASE) MCG/ACT inhaler Inhale 1 puff into the lungs every 6 (six) hours as needed  for wheezing or shortness of breath.     budesonide-formoterol (SYMBICORT) 160-4.5 MCG/ACT inhaler Inhale 2 puffs into the lungs 2 (two) times daily as needed (for respiratory flares).     clonazePAM (KLONOPIN) 1 MG tablet Take 1 mg by mouth 3 (three) times daily as needed for anxiety. (Patient not taking: Reported on 03/03/2023)  0   gabapentin (NEURONTIN) 100 MG capsule Take 100 mg by mouth 3 (three) times daily.     Ibuprofen (ADVIL) 200 MG CAPS Take 200-800 mg by mouth every 6 (six) hours as needed (for pain or headaches).     Na Sulfate-K Sulfate-Mg Sulf 17.5-3.13-1.6 GM/177ML SOLN Suprep (no substitutions)-TAKE AS DIRECTED. 354 mL 0   ondansetron (ZOFRAN) 8 MG tablet Take 1 tablet (8 mg total) by mouth every 8 (eight) hours as needed for nausea or vomiting. 30 tablet 1   oxyCODONE (ROXICODONE) 15 MG immediate release tablet Take 15 mg by mouth every 6 (six) hours as needed for pain.     oxyCODONE (ROXICODONE) 15 MG immediate release tablet Take 1 tablet (15 mg total) by mouth every 6 (six) hours as needed for pain. 30 tablet 0   pantoprazole (PROTONIX) 40 MG tablet Take 1 tablet (40 mg total) by mouth 2 (two) times daily. (Patient taking differently: Take 40 mg by mouth daily as needed (for reflux).) 180  tablet 0   prochlorperazine (COMPAZINE) 10 MG tablet Take 1 tablet (10 mg total) by mouth every 6 (six) hours as needed for nausea or vomiting. 30 tablet 1   valsartan-hydrochlorothiazide (DIOVAN-HCT) 160-12.5 MG tablet Take 1 tablet by mouth daily.     vitamin B-12 (CYANOCOBALAMIN) 500 MCG tablet Take 500 mcg by mouth daily.     No current facility-administered medications for this visit.    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  2016-2018 He then had multiple other TURBTs with  intermittent adherence/tolerability to BCG     05/27/2014 Procedure   Repeat TURBT, path again with LgTa. Only completed 2/6 BCG treatments, then lost to follow-up.    06/09/2015 Procedure   Repeat TURBT with 3 papillary tumors, pathology with LgTa and CIS.    06/2015 Imaging   CT A/P with diffuse bladder wall thickening, no definitive evidence of metastatic disease    07/21/2015 Procedure   Repeat TURBT with path showing high grade dysplasia. Again scheduled for BCG but only completed 1-2 doses.    10/19/2016 Procedure   Repeat TURBT with low grade papillary urothelial carcinoma/CIS. Received 4/6 doses of BCG. Lost to follow-up.    03/09/2019 Imaging   CT CAP with LUL 2cm GGO, minimal right urinary bladder wall thickening of 4mm    01/22/2020 Imaging   CT A/P in the ED for N/V/D/weight loss. Bladder thickening more pronounced at 7mm.    2020-06-26 Imaging   CT urogram for hematuria showed new right lateral masslike thickening of bladder wall, measure 8mm. No evidence metastatic disease.    08/13/2020 Procedure   Repeat TURBT with invasive high-grade urothelial carcinoma with nonfocal invasion into lamina propria (CIS present), second specimen with invasive high-grade urothelial carcinoma extensively involving lamina propria, with one fragment showing involvement of muscle bundles which are equivocal for muscularis mucosae or muscularis propria (CIS again present).    09/19/2020 Procedure   Repeat TURBT for re-resection with invasive high-grade urothelial carcinoma with non-focal lamina propria invasion and CIS, no involvement of muscularis propria    11/20/2020 - 09/17/2021 Chemotherapy   C1 - 8 Pembrolizumab    12/05/2022 Imaging   MRI pelvis wo contrast -Increased size of right posterolateral bladder wall mass, now measuring up to 3.2 cm, previously 1.2 cm, with extramural extension of the mass encasing the distal right ureter and resulting in upstream right ureteral dilation. The  extravesicular extension of the bladder mass also is in close contact with the vas deferens near the seminal vesicles for which abutment or invasion cannot be excluded.     01/03/2023 Imaging   CT AP -Increasing right posterior lateral bladder wall thickening, concerning for disease progression. New right ureteral obstruction associated hydroureteronephrosis.  -Multiple subcentimeter hypoattenuating foci within the liver, favored to be benign given stability since 2016.   CT Chest: No intrathoracic metastasis with stable findings since December 28.     01/17/2023 PET scan   PET from [facility] - Thickening of the right posterolateral bladder wall, similar to prior CT dated 01/03/2023.  - Minimally FDG avid bilateral inguinal lymph nodes which do not appear pathologically enlarged or abnormal in appearance. These are favored to be reactive.  -There is a possible mildly hypermetabolic nodule along the posterior wall of the rectum which is not completely characterized on this study.  Suggest correlation with colonoscopy, which patient has scheduled 01/20/2023.    02/07/2023 Pathology Results   A: Ureter margin, right distal, biopsy - Benign ureter with chronic inflammation - Negative for malignancy   B: Ureter margin, left distal, biopsy - Focal atypia, worrisome for focal involvement by urothelial carcinoma in situ   C: Bladder and prostate, cystoprostatectomy   BLADDER - Multifocal invasive high grade papillary urothelial carcinoma              - Dominant tumor is in right posterior upper bladder wall, 3.0 cm, invasive through muscularis propria with macroscopic invasion of peri-vesicle adipose tissue              - Satellite tumor in left bladder extending into left ureter, 0.9 cm, invasive into lamina propria - Extensive multifocal high grade papillary urothelial carcinoma and urothelial carcinoma in situ present in all random sections of the bladder mucosa (anterior, posterior, dome, trigone,  right lateral, left lateral walls) away from invasive cancers - 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 (contiguous with carcinoma in situ of trigone), distal margin uninvolved - Margins of resection negative for invasive carcinoma - Left ureter margin of this specimen shows urothelial carcinoma in situ, superceded by additional specimens, final left ureter margin is in specimen F, which shows patchy atypia 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 - See synoptic report    PROSTATE - 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 and involves prostatic ducts - Prostatic stroma with no evidence of involvement by urothelial carcinoma, negative for prostate carcinoma - Prostatic urethral distal margin negative   D: Lymph node, right pelvic, biopsy - Five lymph nodes with no evidence of malignancy (0/5)    E: Lymph node, left pelvic, biopsy - Metastatic carcinoma present in one of five lymph nodes (1/5), size of metastatic deposit 1.4 mm, without extranodal extension   F: True ureteral margin, left distal, biopsy - 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 - Negative for invasive carcinoma   G: True ureteral margin, right distal, biopsy - Benign ureter with chronic inflammation - Negative for malignancy  SPECIMEN    Procedure:    Radical cystoprostatectomy   TUMOR    Tumor Site:    Right lateral wall     Tumor Site:    Left lateral wall     Tumor Site:    Posterior wall     Histologic Type:    Urothelial carcinoma, invasive (conventional)     Histologic Grade:    High-grade     Tumor Size:    Greatest Dimension (Centimeters): 3.0 cm       Additional Dimension (Centimeters):    2.9 cm       Additional Dimension (Centimeters):    2.8 cm     Tumor Extent:    Invades perivesical soft tissue microscopically     Lymphatic and / or Vascular Invasion:    Not identified     Tumor Configuration:    Papillary     Tumor  Configuration:    Solid / nodule     Tumor Configuration:    Flat     Treatment Effect Post Neoadjuvant Chemotherapy:    Weak and no response: residual cancer cells occupying greater than or equal to 50% of the tumor bed or absence of regressive changes (TRG3)     Tumor Comment:    Appears to have received 1 dose of pembrolizumab, no apparent histologic response.  MARGINS    Margin Status for Invasive Tumor:    All margins negative for invasive tumor       Closest Margin(s) to Invasive tumor:    Soft tissue: right peri-vesicular margin       Distance from Invasive Tumor to Closest Margin:    13 mm     Margin Status for Carcinoma in Situ / Noninvasive Papillary Urothelial Carcinoma:    Carcinoma in situ / noninvasive papillary urothelial carcinoma present at margin       Margin(s) Involved by Carcinoma in Situ / Noninvasive Papillary Urothelial Carcinoma:    Left ureteral   REGIONAL LYMPH NODES     Regional Lymph Node Status:          :    Tumor present in regional lymph node(s)         Number of Lymph Nodes with Tumor:    1         Size of Largest Nodal Metastatic Deposit:    0.14 cm         Nodal Site with Largest Metastatic Deposit:    Left pelvic         Size of Largest Lymph Node with Tumor:    0.7 cm         Largest Lymph Node with Tumor:    left pelvic         Extranodal Extension (ENE):    Not identified       Number of Lymph Nodes Examined:    10   pTNM CLASSIFICATION (AJCC 8th Edition)     Reporting of pT, pN, and (when applicable) pM categories is based on information available to the pathologist at the time the report is issued. As per the AJCC (Chapter 1, 8th Ed.) it is the managing physician's responsibility to establish the final pathologic stage based upon all pertinent information, including but potentially not limited to this pathology report.     Modified Classification:    y     pT Category:    pT3a     T Suffix:    (m)     pN Category:    pN1    02/20/2023 Procedure    Colonoscopy Impression:    - The examined portion of the ileum was normal.                         - The entire examined colon is normal on direct and retroflexion views.                         - Views were limited by liquid stool throughout the colon and difficulty suctioning the stool.                         - No specimens collected.    02/23/2023 Imaging   CT AP Hepatobiliary: Hypodensities in liver consistent benign cysts. Postcholecystectomy. Mild extrahepatic biliary duct dilatation not changed from prior.   04/06/2023 Imaging   CT AP from [facility] Relative hypoenhancement of the right kidney when compared to the left which is nonspecific. This could be related to altered perfusion or infection/pyelonephritis. No significant hydronephrosis is identified. Clinical correlation is recommended.  LIVER: Normal liver contour. Subcentimeter hypoattenuating hepatic foci too small to characterize further on CT.    05/10/2023 Cancer Staging   Staging form: Urinary Bladder, AJCC 8th Edition - Clinical: Stage IIIA (  cT3, cN1, cM0) - Signed by [name], MD on 05/10/2023 WHO/ISUP grade (low/high): High Grade Histologic grading system: 2 grade system   05/31/2023 -  Chemotherapy   Patient is on Treatment Plan : BLADDER Nivolumab (240) q14d        Past Medical History:  Diagnosis Date   Allergy    Anxiety    Arthritis    Asthma    Bladder cancer (HCC) dx'd 10/2013   recurrent   Chronic gastritis    Chronic low back pain    Colon cancer (HCC) dx'd 09/2011   COPD (chronic obstructive pulmonary disease) (HCC)    Depression    Diverticulosis    Dysuria    GERD (gastroesophageal reflux disease)    History of acute pancreatitis    due to SIRS   History of adenomatous polyp of colon    04-04-2015  last colonoscopy w/ multiple polyps--  tubular adenomas, beign polypoid colonic mucous   History of bladder cancer monitory by  dr Vernie Ammons   papillary carcinoma low-grade  s/p TURBT  10-24-2013   History of colon cancer oncologist-- dr Truett Perna--  dx  with Hereditary non-polyposis colon cancer syndrome,  MLH1 gene mutation--     dx june 2013--  stage II, T3,N0--  s/p  transverse colectomy--  completed 2 cycles  chemotherapy--  NO RECURRENCE in clinical remission   Hypertension    Lactose intolerance    Lower urinary tract symptoms (LUTS)    Lynch syndrome    MLH1 gene mutation    Short of breath on exertion    Wears glasses     REVIEW OF SYSTEMS:   All relevant systems were reviewed with the patient and are negative.   VITALS:  Blood pressure (!) 151/74, pulse 80, temperature (!) 97.5 F (36.4 C), temperature source Temporal, resp. rate 17, weight 180 lb 4.8 oz (81.8 kg), SpO2 96%.  Wt Readings from Last 3 Encounters:  05/12/23 180 lb 4.8 oz (81.8 kg)  03/14/23 170 lb 6.4 oz (77.3 kg)  03/03/23 172 lb 9.6 oz (78.3 kg)    Body mass index is 25.87 kg/m.  Performance status (ECOG): 3  PHYSICAL EXAM:   GENERAL:alert, no distress and comfortable SKIN: skin color normal, no rashes  EYES: normal, sclera clear OROPHARYNX: no exudate, no erythema    NECK: supple,  non-tender, without nodularity LYMPH:  no palpable cervical lymphadenopathy LUNGS: clear to auscultation with normal breathing effort.  No wheeze or rales HEART: regular rate & rhythm and no murmurs and no lower extremity edema ABDOMEN: abdomen soft, non-tender and nondistended.  No signs of infection around the ostomy site.   Urine is clear. Musculoskeletal: no edema NEURO: alert, fluent speech, no focal motor/sensory deficits.  Strength and sensation equal bilaterally. Alternating hip movement slightly decreased  LABORATORY DATA:  I have reviewed the data as listed    Component Value Date/Time   NA 142 03/18/2023 0754   NA 140 01/29/2014 0937   K 4.4 03/18/2023 0754   K 4.1 01/29/2014 0937   CL 107 03/18/2023 0754   CO2 29 03/18/2023 0754   CO2 26 01/29/2014 0937   GLUCOSE 94 03/18/2023  0754   GLUCOSE 93 01/29/2014 0937   BUN 27 (H) 03/18/2023 0754   BUN 16.1 01/29/2014 0937   CREATININE 2.07 (H) 03/18/2023 0754   CREATININE 1.0 01/29/2014 0937   CALCIUM 8.6 (L) 03/18/2023 0754   CALCIUM 9.0 01/29/2014 0937   PROT 6.3 (L) 03/18/2023 0160  PROT 6.6 01/29/2014 0937   ALBUMIN 3.7 03/18/2023 0754   ALBUMIN 3.2 (L) 01/29/2014 0937   AST 10 (L) 03/18/2023 0754   AST 27 01/29/2014 0937   ALT 7 03/18/2023 0754   ALT 29 01/29/2014 0937   ALKPHOS 53 03/18/2023 0754   ALKPHOS 86 01/29/2014 0937   BILITOT 0.4 03/18/2023 0754   BILITOT 0.36 01/29/2014 0937   GFRNONAA 35 (L) 03/18/2023 0754   GFRAA 57 (L) 09/20/2014 1352    No results found for: "SPEP", "UPEP"  Lab Results  Component Value Date   WBC 5.7 05/12/2023   NEUTROABS 3.4 05/12/2023   HGB 14.2 05/12/2023   HCT 43.4 05/12/2023   MCV 99.8 05/12/2023   PLT 231 05/12/2023      Chemistry      Component Value Date/Time   NA 142 03/18/2023 0754   NA 140 01/29/2014 0937   K 4.4 03/18/2023 0754   K 4.1 01/29/2014 0937   CL 107 03/18/2023 0754   CO2 29 03/18/2023 0754   CO2 26 01/29/2014 0937   BUN 27 (H) 03/18/2023 0754   BUN 16.1 01/29/2014 0937   CREATININE 2.07 (H) 03/18/2023 0754   CREATININE 1.0 01/29/2014 0937      Component Value Date/Time   CALCIUM 8.6 (L) 03/18/2023 0754   CALCIUM 9.0 01/29/2014 0937   ALKPHOS 53 03/18/2023 0754   ALKPHOS 86 01/29/2014 0937   AST 10 (L) 03/18/2023 0754   AST 27 01/29/2014 0937   ALT 7 03/18/2023 0754   ALT 29 01/29/2014 0937   BILITOT 0.4 03/18/2023 0754   BILITOT 0.36 01/29/2014 0937       RADIOGRAPHIC STUDIES: I have personally reviewed the radiological images as listed and agreed with the findings in the report. No results found.

## 2023-05-10 NOTE — Assessment & Plan Note (Signed)
Given the time lapse recommend restaging imaging. Adjuvant radiation as planned by Dr. Kathrynn Running if no distant metastases

## 2023-05-10 NOTE — Progress Notes (Signed)
  Radiation Oncology         [phone]) [phone] ________________________________  Name: Todd [name]. MRN: [id number]  Date: 05/09/2023  DOB: September 26, 1955  SIMULATION NOTE    ICD-10-CM   1. 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  Following his planning appointment, patient was admitted to [facility] with sepsis requiring a prolonged hospitalization.  This produced a delay between his planning CT and his initiation of treatment.  Due to the prolonged delay as well as illness, we brought the patient back to the CT simulation suite and set him up and his immobilization devices for reimaging.  The current images were fused with his original planning images to assess for anatomic changes including patient's size.  After carefully comparing the 2 scans, the patient was felt to maintain the same anatomy and was approved to proceed with his originally planned radiation plan from December 22.  ________________________________  Todd [name], M.D.

## 2023-05-11 ENCOUNTER — Ambulatory Visit: Payer: 59

## 2023-05-11 ENCOUNTER — Other Ambulatory Visit: Payer: Self-pay

## 2023-05-11 ENCOUNTER — Ambulatory Visit
Admission: RE | Admit: 2023-05-11 | Discharge: 2023-05-11 | Discharge status: Home / Self Care | Payer: 59 | Source: Ambulatory Visit | Attending: Radiation Oncology

## 2023-05-11 DIAGNOSIS — C678 Malignant neoplasm of overlapping sites of bladder: Secondary | ICD-10-CM | POA: Diagnosis not present

## 2023-05-11 LAB — RAD ONC ARIA SESSION SUMMARY
Course Elapsed Days: 2
Plan Fractions Treated to Date: 3
Plan Prescribed Dose Per Fraction: 1.8 Gy
Plan Total Fractions Prescribed: 25
Plan Total Prescribed Dose: 45 Gy
Reference Point Dosage Given to Date: 5.4 Gy
Reference Point Session Dosage Given: 1.8 Gy
Session Number: 3

## 2023-05-12 ENCOUNTER — Ambulatory Visit: Payer: 59

## 2023-05-12 ENCOUNTER — Inpatient Hospital Stay: Payer: 59

## 2023-05-12 ENCOUNTER — Inpatient Hospital Stay (HOSPITAL_BASED_OUTPATIENT_CLINIC_OR_DEPARTMENT_OTHER): Payer: 59

## 2023-05-12 ENCOUNTER — Other Ambulatory Visit: Payer: Self-pay

## 2023-05-12 ENCOUNTER — Ambulatory Visit
Admission: RE | Admit: 2023-05-12 | Discharge: 2023-05-12 | Discharge status: Home / Self Care | Payer: 59 | Source: Ambulatory Visit | Attending: Radiation Oncology

## 2023-05-12 VITALS — BP 151/74 | HR 80 | Temp 97.5°F | Resp 17 | Wt 180.3 lb

## 2023-05-12 DIAGNOSIS — R2990 Unspecified symptoms and signs involving the nervous system: Secondary | ICD-10-CM | POA: Diagnosis not present

## 2023-05-12 DIAGNOSIS — R4189 Other symptoms and signs involving cognitive functions and awareness: Secondary | ICD-10-CM | POA: Diagnosis not present

## 2023-05-12 DIAGNOSIS — C679 Malignant neoplasm of bladder, unspecified: Secondary | ICD-10-CM

## 2023-05-12 DIAGNOSIS — E538 Deficiency of other specified B group vitamins: Secondary | ICD-10-CM | POA: Diagnosis not present

## 2023-05-12 DIAGNOSIS — C775 Secondary and unspecified malignant neoplasm of intrapelvic lymph nodes: Secondary | ICD-10-CM | POA: Diagnosis not present

## 2023-05-12 DIAGNOSIS — C678 Malignant neoplasm of overlapping sites of bladder: Secondary | ICD-10-CM | POA: Diagnosis not present

## 2023-05-12 DIAGNOSIS — Z1509 Genetic susceptibility to other malignant neoplasm: Secondary | ICD-10-CM

## 2023-05-12 DIAGNOSIS — Z9889 Other specified postprocedural states: Secondary | ICD-10-CM

## 2023-05-12 LAB — COMPREHENSIVE METABOLIC PANEL
ALT: 8 U/L (ref 0–44)
AST: 11 U/L — ABNORMAL LOW (ref 15–41)
Albumin: 4 g/dL (ref 3.5–5.0)
Alkaline Phosphatase: 56 U/L (ref 38–126)
Anion gap: 5 (ref 5–15)
BUN: 21 mg/dL (ref 8–23)
CO2: 26 mmol/L (ref 22–32)
Calcium: 9.1 mg/dL (ref 8.9–10.3)
Chloride: 107 mmol/L (ref 98–111)
Creatinine, Ser: 1.68 mg/dL — ABNORMAL HIGH (ref 0.61–1.24)
GFR, Estimated: 44 mL/min — ABNORMAL LOW (ref >60)
Glucose, Bld: 93 mg/dL (ref 70–99)
Potassium: 4.5 mmol/L (ref 3.5–5.1)
Sodium: 138 mmol/L (ref 135–145)
Total Bilirubin: 0.5 mg/dL (ref 0.0–1.2)
Total Protein: 7.1 g/dL (ref 6.5–8.1)

## 2023-05-12 LAB — RAD ONC ARIA SESSION SUMMARY
Course Elapsed Days: 3
Plan Fractions Treated to Date: 4
Plan Prescribed Dose Per Fraction: 1.8 Gy
Plan Total Fractions Prescribed: 25
Plan Total Prescribed Dose: 45 Gy
Reference Point Dosage Given to Date: 7.2 Gy
Reference Point Session Dosage Given: 1.8 Gy
Session Number: 4

## 2023-05-12 LAB — MAGNESIUM: Magnesium: 2 mg/dL (ref 1.7–2.4)

## 2023-05-12 LAB — CBC WITH DIFFERENTIAL (CANCER CENTER ONLY)
Abs Immature Granulocytes: 0.01 10*3/uL (ref 0.00–0.07)
Basophils Absolute: 0 10*3/uL (ref 0.0–0.1)
Basophils Relative: 0 %
Eosinophils Absolute: 0.3 10*3/uL (ref 0.0–0.5)
Eosinophils Relative: 6 %
HCT: 43.4 % (ref 39.0–52.0)
Hemoglobin: 14.2 g/dL (ref 13.0–17.0)
Immature Granulocytes: 0 %
Lymphocytes Relative: 27 %
Lymphs Abs: 1.5 10*3/uL (ref 0.7–4.0)
MCH: 32.6 pg (ref 26.0–34.0)
MCHC: 32.7 g/dL (ref 30.0–36.0)
MCV: 99.8 fL (ref 80.0–100.0)
Monocytes Absolute: 0.4 10*3/uL (ref 0.1–1.0)
Monocytes Relative: 7 %
Neutro Abs: 3.4 10*3/uL (ref 1.7–7.7)
Neutrophils Relative %: 60 %
Platelet Count: 231 10*3/uL (ref 150–400)
RBC: 4.35 MIL/uL (ref 4.22–5.81)
RDW: 12.5 % (ref 11.5–15.5)
WBC Count: 5.7 10*3/uL (ref 4.0–10.5)
nRBC: 0 % (ref 0.0–0.2)

## 2023-05-12 LAB — VITAMIN B12: Vitamin B-12: 326 pg/mL (ref 180–914)

## 2023-05-12 NOTE — Assessment & Plan Note (Signed)
Coordination deficit with alternating hand movement.  Will obtain MRI for evaluation

## 2023-05-12 NOTE — Assessment & Plan Note (Signed)
History of ileal conduit Has been B12 over few months. If persistently low, will start B12 injection

## 2023-05-12 NOTE — Assessment & Plan Note (Signed)
MLH1 positive Lynch syndrome Recommend testing first degree family Daughter report having tested.

## 2023-05-13 ENCOUNTER — Ambulatory Visit
Admission: RE | Admit: 2023-05-13 | Discharge: 2023-05-13 | Disposition: A | Discharge status: Home / Self Care | Payer: 59 | Source: Ambulatory Visit | Attending: Radiation Oncology | Admitting: Radiation Oncology

## 2023-05-13 ENCOUNTER — Ambulatory Visit: Payer: 59

## 2023-05-13 ENCOUNTER — Other Ambulatory Visit: Payer: Self-pay

## 2023-05-13 DIAGNOSIS — C678 Malignant neoplasm of overlapping sites of bladder: Secondary | ICD-10-CM | POA: Diagnosis not present

## 2023-05-13 LAB — RAD ONC ARIA SESSION SUMMARY
Course Elapsed Days: 4
Plan Fractions Treated to Date: 5
Plan Prescribed Dose Per Fraction: 1.8 Gy
Plan Total Fractions Prescribed: 25
Plan Total Prescribed Dose: 45 Gy
Reference Point Dosage Given to Date: 9 Gy
Reference Point Session Dosage Given: 1.8 Gy
Session Number: 5

## 2023-05-16 ENCOUNTER — Ambulatory Visit: Payer: 59

## 2023-05-16 ENCOUNTER — Inpatient Hospital Stay: Payer: 59

## 2023-05-16 ENCOUNTER — Ambulatory Visit
Admission: RE | Admit: 2023-05-16 | Discharge: 2023-05-16 | Disposition: A | Discharge status: Home / Self Care | Payer: 59 | Source: Ambulatory Visit | Attending: Radiation Oncology | Admitting: Radiation Oncology

## 2023-05-16 ENCOUNTER — Other Ambulatory Visit: Payer: Self-pay

## 2023-05-16 DIAGNOSIS — C775 Secondary and unspecified malignant neoplasm of intrapelvic lymph nodes: Secondary | ICD-10-CM | POA: Insufficient documentation

## 2023-05-16 DIAGNOSIS — C679 Malignant neoplasm of bladder, unspecified: Secondary | ICD-10-CM | POA: Insufficient documentation

## 2023-05-16 LAB — RAD ONC ARIA SESSION SUMMARY
Course Elapsed Days: 7
Plan Fractions Treated to Date: 6
Plan Prescribed Dose Per Fraction: 1.8 Gy
Plan Total Fractions Prescribed: 25
Plan Total Prescribed Dose: 45 Gy
Reference Point Dosage Given to Date: 10.8 Gy
Reference Point Session Dosage Given: 1.8 Gy
Session Number: 6

## 2023-05-16 NOTE — Progress Notes (Signed)
[facility] Clinical Social Work  Clinical Social Work was referred by medical provider for assessment of psychosocial needs.  Clinical Social Worker contacted patient by phone to offer support and assess for needs.   CSW and Patient discussed treatment and supports present. Initial Assessment scheduled for Wednesday 2/05. CSW left vm for patient's daughter with direct information if needed.   Marguerita Merles, LCSW  Clinical Social Worker [facility]

## 2023-05-17 ENCOUNTER — Ambulatory Visit: Payer: 59

## 2023-05-17 ENCOUNTER — Other Ambulatory Visit: Payer: Self-pay

## 2023-05-17 ENCOUNTER — Ambulatory Visit
Admission: RE | Admit: 2023-05-17 | Discharge: 2023-05-17 | Disposition: A | Discharge status: Home / Self Care | Payer: 59 | Source: Ambulatory Visit | Attending: Radiation Oncology | Admitting: Radiation Oncology

## 2023-05-17 DIAGNOSIS — C679 Malignant neoplasm of bladder, unspecified: Secondary | ICD-10-CM | POA: Diagnosis not present

## 2023-05-17 LAB — RAD ONC ARIA SESSION SUMMARY
Course Elapsed Days: 8
Plan Fractions Treated to Date: 7
Plan Prescribed Dose Per Fraction: 1.8 Gy
Plan Total Fractions Prescribed: 25
Plan Total Prescribed Dose: 45 Gy
Reference Point Dosage Given to Date: 12.6 Gy
Reference Point Session Dosage Given: 1.8 Gy
Session Number: 7

## 2023-05-18 ENCOUNTER — Ambulatory Visit: Payer: 59

## 2023-05-18 ENCOUNTER — Ambulatory Visit
Admission: RE | Admit: 2023-05-18 | Discharge: 2023-05-18 | Disposition: A | Discharge status: Home / Self Care | Payer: 59 | Source: Ambulatory Visit | Attending: Radiation Oncology | Admitting: Radiation Oncology

## 2023-05-18 ENCOUNTER — Encounter: Payer: Self-pay | Admitting: *Deleted

## 2023-05-18 ENCOUNTER — Inpatient Hospital Stay: Payer: 59

## 2023-05-18 ENCOUNTER — Other Ambulatory Visit: Payer: Self-pay

## 2023-05-18 DIAGNOSIS — C679 Malignant neoplasm of bladder, unspecified: Secondary | ICD-10-CM | POA: Diagnosis not present

## 2023-05-18 LAB — RAD ONC ARIA SESSION SUMMARY
Course Elapsed Days: 9
Plan Fractions Treated to Date: 8
Plan Prescribed Dose Per Fraction: 1.8 Gy
Plan Total Fractions Prescribed: 25
Plan Total Prescribed Dose: 45 Gy
Reference Point Dosage Given to Date: 14.4 Gy
Reference Point Session Dosage Given: 1.8 Gy
Session Number: 8

## 2023-05-18 NOTE — Progress Notes (Signed)
 [facility] CSW Progress Note  Visual merchandiser met with patient to assess pyschosocial needs.   Maudie Sorrow, LCSW Clinical Social Worker [facility]

## 2023-05-19 ENCOUNTER — Encounter (HOSPITAL_BASED_OUTPATIENT_CLINIC_OR_DEPARTMENT_OTHER): Payer: Self-pay | Admitting: Emergency Medicine

## 2023-05-19 ENCOUNTER — Emergency Department (HOSPITAL_BASED_OUTPATIENT_CLINIC_OR_DEPARTMENT_OTHER)
Admission: EM | Admit: 2023-05-19 | Discharge: 2023-05-20 | Disposition: A | Discharge status: Home / Self Care | Payer: 59 | Attending: Emergency Medicine | Admitting: Emergency Medicine

## 2023-05-19 ENCOUNTER — Ambulatory Visit
Admission: RE | Admit: 2023-05-19 | Discharge: 2023-05-19 | Disposition: A | Discharge status: Home / Self Care | Payer: 59 | Source: Ambulatory Visit | Attending: Radiation Oncology | Admitting: Radiation Oncology

## 2023-05-19 ENCOUNTER — Ambulatory Visit (HOSPITAL_COMMUNITY)
Admission: RE | Admit: 2023-05-19 | Discharge: 2023-05-19 | Disposition: A | Discharge status: Home / Self Care | Payer: 59 | Source: Ambulatory Visit

## 2023-05-19 ENCOUNTER — Ambulatory Visit: Payer: 59

## 2023-05-19 ENCOUNTER — Emergency Department (HOSPITAL_BASED_OUTPATIENT_CLINIC_OR_DEPARTMENT_OTHER): Payer: 59

## 2023-05-19 ENCOUNTER — Other Ambulatory Visit: Payer: Self-pay

## 2023-05-19 DIAGNOSIS — N390 Urinary tract infection, site not specified: Secondary | ICD-10-CM | POA: Insufficient documentation

## 2023-05-19 DIAGNOSIS — C679 Malignant neoplasm of bladder, unspecified: Secondary | ICD-10-CM | POA: Insufficient documentation

## 2023-05-19 DIAGNOSIS — R1030 Lower abdominal pain, unspecified: Secondary | ICD-10-CM

## 2023-05-19 DIAGNOSIS — R4189 Other symptoms and signs involving cognitive functions and awareness: Secondary | ICD-10-CM | POA: Insufficient documentation

## 2023-05-19 DIAGNOSIS — N50811 Right testicular pain: Secondary | ICD-10-CM | POA: Diagnosis not present

## 2023-05-19 DIAGNOSIS — R2990 Unspecified symptoms and signs involving the nervous system: Secondary | ICD-10-CM

## 2023-05-19 DIAGNOSIS — N50812 Left testicular pain: Secondary | ICD-10-CM | POA: Diagnosis not present

## 2023-05-19 DIAGNOSIS — C775 Secondary and unspecified malignant neoplasm of intrapelvic lymph nodes: Secondary | ICD-10-CM | POA: Insufficient documentation

## 2023-05-19 LAB — COMPREHENSIVE METABOLIC PANEL
ALT: 5 U/L (ref 0–44)
AST: 9 U/L — ABNORMAL LOW (ref 15–41)
Albumin: 3.8 g/dL (ref 3.5–5.0)
Alkaline Phosphatase: 48 U/L (ref 38–126)
Anion gap: 7 (ref 5–15)
BUN: 21 mg/dL (ref 8–23)
CO2: 23 mmol/L (ref 22–32)
Calcium: 8.4 mg/dL — ABNORMAL LOW (ref 8.9–10.3)
Chloride: 108 mmol/L (ref 98–111)
Creatinine, Ser: 1.59 mg/dL — ABNORMAL HIGH (ref 0.61–1.24)
GFR, Estimated: 47 mL/min — ABNORMAL LOW (ref >60)
Glucose, Bld: 90 mg/dL (ref 70–99)
Potassium: 4.5 mmol/L (ref 3.5–5.1)
Sodium: 138 mmol/L (ref 135–145)
Total Bilirubin: 0.3 mg/dL (ref 0.0–1.2)
Total Protein: 6.5 g/dL (ref 6.5–8.1)

## 2023-05-19 LAB — CBC WITH DIFFERENTIAL/PLATELET
Abs Immature Granulocytes: 0.02 10*3/uL (ref 0.00–0.07)
Basophils Absolute: 0 10*3/uL (ref 0.0–0.1)
Basophils Relative: 0 %
Eosinophils Absolute: 0.5 10*3/uL (ref 0.0–0.5)
Eosinophils Relative: 11 %
HCT: 40.4 % (ref 39.0–52.0)
Hemoglobin: 13.3 g/dL (ref 13.0–17.0)
Immature Granulocytes: 0 %
Lymphocytes Relative: 24 %
Lymphs Abs: 1.1 10*3/uL (ref 0.7–4.0)
MCH: 32.7 pg (ref 26.0–34.0)
MCHC: 32.9 g/dL (ref 30.0–36.0)
MCV: 99.3 fL (ref 80.0–100.0)
Monocytes Absolute: 0.4 10*3/uL (ref 0.1–1.0)
Monocytes Relative: 9 %
Neutro Abs: 2.5 10*3/uL (ref 1.7–7.7)
Neutrophils Relative %: 56 %
Platelets: 166 10*3/uL (ref 150–400)
RBC: 4.07 MIL/uL — ABNORMAL LOW (ref 4.22–5.81)
RDW: 12.9 % (ref 11.5–15.5)
WBC: 4.6 10*3/uL (ref 4.0–10.5)
nRBC: 0 % (ref 0.0–0.2)

## 2023-05-19 LAB — URINALYSIS, W/ REFLEX TO CULTURE (INFECTION SUSPECTED)
Bilirubin Urine: NEGATIVE
Glucose, UA: NEGATIVE mg/dL
Ketones, ur: NEGATIVE mg/dL
Nitrite: POSITIVE — AB
Protein, ur: 30 mg/dL — AB
Specific Gravity, Urine: 1.03 (ref 1.005–1.030)
pH: 6 (ref 5.0–8.0)

## 2023-05-19 LAB — RAD ONC ARIA SESSION SUMMARY
Course Elapsed Days: 10
Plan Fractions Treated to Date: 9
Plan Prescribed Dose Per Fraction: 1.8 Gy
Plan Total Fractions Prescribed: 25
Plan Total Prescribed Dose: 45 Gy
Reference Point Dosage Given to Date: 16.2 Gy
Reference Point Session Dosage Given: 1.8 Gy
Session Number: 9

## 2023-05-19 MED ORDER — HYDROMORPHONE HCL 1 MG/ML IJ SOLN
1.0000 mg | Freq: Once | INTRAMUSCULAR | Status: AC
  Administered 2023-05-19: 1 mg via INTRAVENOUS
  Filled 2023-05-19: qty 1

## 2023-05-19 MED ORDER — IOHEXOL 350 MG/ML SOLN
50.0000 mL | Freq: Once | INTRAVENOUS | Status: AC | PRN
  Administered 2023-05-19: 50 mL via INTRAVENOUS

## 2023-05-19 MED ORDER — GADOBUTROL 1 MMOL/ML IV SOLN
7.5000 mL | Freq: Once | INTRAVENOUS | Status: AC | PRN
  Administered 2023-05-19: 7.5 mL via INTRAVENOUS

## 2023-05-19 MED ORDER — SODIUM CHLORIDE 0.9 % IV SOLN
1.0000 g | Freq: Once | INTRAVENOUS | Status: AC
  Administered 2023-05-20: 1 g via INTRAVENOUS
  Filled 2023-05-19: qty 10

## 2023-05-19 MED ORDER — IOHEXOL 300 MG/ML  SOLN
100.0000 mL | Freq: Once | INTRAMUSCULAR | Status: AC | PRN
  Administered 2023-05-19: 85 mL via INTRAVENOUS

## 2023-05-19 NOTE — ED Triage Notes (Signed)
 Currently having radiation, bladder CA  Pain and tenderness of testicles to touch x 3 days  Aching pain

## 2023-05-19 NOTE — ED Provider Notes (Signed)
 [place] EMERGENCY DEPARTMENT AT [facility] Provider Note   CSN: [id number] Arrival date & time: 05/19/23  1918     History  No chief complaint on file.   Todd [name]. is a 68 y.o. male.  Patient is a 68 year old male who presents with abdominal.  He has a history of urothelial carcinoma of the bladder.  He underwent a radical cystoprostatectomy at [facility] in October.  He has a urostomy in place.  He is currently undergoing radiation therapy which he started 2 weeks ago.  He said that he started having some pain across his lower abdomen today after the treatment.  He has had some ongoing pain to his lower abdomen.  He has had some nausea but no vomiting.  No change in his stools.  No known fevers.  Over the last 3 to 4 days he has had some increased pain to his testicles.  He said he had this happen once before when he first had the surgery and it was related to swelling.  He denies any change in his urine.       Home Medications Prior to Admission medications   Medication Sig Start Date End Date Taking? Authorizing Provider  albuterol (PROVENTIL HFA;VENTOLIN HFA) 108 (90 BASE) MCG/ACT inhaler Inhale 1 puff into the lungs every 6 (six) hours as needed for wheezing or shortness of breath.    [provider]  budesonide-formoterol (SYMBICORT) 160-4.5 MCG/ACT inhaler Inhale 2 puffs into the lungs 2 (two) times daily as needed (for respiratory flares).    [provider]  clonazePAM  (KLONOPIN ) 1 MG tablet Take 1 mg by mouth 3 (three) times daily as needed for anxiety. Patient not taking: Reported on 03/03/2023 09/19/17   [provider]  gabapentin  (NEURONTIN ) 100 MG capsule Take 100 mg by mouth 3 (three) times daily.    [provider]  Ibuprofen (ADVIL) 200 MG CAPS Take 200-800 mg by mouth every 6 (six) hours as needed (for pain or headaches).    [provider]  Na Sulfate-K Sulfate-Mg Sulf 17.5-3.13-1.6 GM/177ML SOLN Suprep (no  substitutions)-TAKE AS DIRECTED. 12/27/19   Armbruster, Elspeth SQUIBB, MD  ondansetron  (ZOFRAN ) 8 MG tablet Take 1 tablet (8 mg total) by mouth every 8 (eight) hours as needed for nausea or vomiting. 03/18/23   Tina Pauletta BROCKS, MD  oxyCODONE  (ROXICODONE ) 15 MG immediate release tablet Take 15 mg by mouth every 6 (six) hours as needed for pain. 05/02/18   [provider]  oxyCODONE  (ROXICODONE ) 15 MG immediate release tablet Take 1 tablet (15 mg total) by mouth every 6 (six) hours as needed for pain. 02/25/23   Ula Prentice SAUNDERS, MD  pantoprazole  (PROTONIX ) 40 MG tablet Take 1 tablet (40 mg total) by mouth 2 (two) times daily. Patient taking differently: Take 40 mg by mouth daily as needed (for reflux). 05/21/19   Armbruster, Elspeth SQUIBB, MD  prochlorperazine  (COMPAZINE ) 10 MG tablet Take 1 tablet (10 mg total) by mouth every 6 (six) hours as needed for nausea or vomiting. 03/18/23   Tina Pauletta BROCKS, MD  valsartan -hydrochlorothiazide  (DIOVAN -HCT) 160-12.5 MG tablet Take 1 tablet by mouth daily.    [provider]  vitamin B-12 (CYANOCOBALAMIN ) 500 MCG tablet Take 500 mcg by mouth daily.    [provider]      Allergies    Clindamycin /lincomycin, Milk-related compounds, and Hydrocodone    Review of Systems   Review of Systems  Constitutional:  Negative for chills, diaphoresis, fatigue and fever.  HENT:  Negative for congestion, rhinorrhea and sneezing.   Eyes: Negative.   Respiratory:  Negative for cough, chest tightness and shortness of breath.   Cardiovascular:  Negative for chest pain and leg swelling.  Gastrointestinal:  Positive for abdominal pain and nausea. Negative for blood in stool, diarrhea and vomiting.  Genitourinary:  Positive for scrotal swelling and testicular pain. Negative for decreased urine volume, difficulty urinating, flank pain, frequency and hematuria.  Musculoskeletal:  Negative for arthralgias and back pain.  Skin:  Negative for rash.  Neurological:   Negative for dizziness, speech difficulty, weakness, numbness and headaches.    Physical Exam Updated Vital Signs BP 120/67   Pulse 76   Temp 97.8 F (36.6 C)   Resp 18   SpO2 98%  Physical Exam Constitutional:      Appearance: He is well-developed.  HENT:     Head: Normocephalic and atraumatic.  Eyes:     Pupils: Pupils are equal, round, and reactive to light.  Cardiovascular:     Rate and Rhythm: Normal rate and regular rhythm.     Heart sounds: Normal heart sounds.  Pulmonary:     Effort: Pulmonary effort is normal. No respiratory distress.     Breath sounds: Normal breath sounds. No wheezing or rales.  Chest:     Chest wall: No tenderness.  Abdominal:     General: Bowel sounds are normal.     Palpations: Abdomen is soft.     Tenderness: There is abdominal tenderness (Tenderness across his mid and lower abdomen). There is no guarding or rebound.     Comments: Urostomy tube in place with urine in the bag  Genitourinary:    Comments: With chaperone present, patient's noted to have some generalized tenderness to his testicles bilaterally.  There is no tenderness to the scrotal sac itself.  No redness or discoloration of the area.  Mild swelling to the scrotum. Musculoskeletal:        General: Normal range of motion.     Cervical back: Normal range of motion and neck supple.  Lymphadenopathy:     Cervical: No cervical adenopathy.  Skin:    General: Skin is warm and dry.     Findings: No rash.  Neurological:     Mental Status: He is alert and oriented to person, place, and time.     ED Results / Procedures / Treatments   Labs (all labs ordered are listed, but only abnormal results are displayed) Labs Reviewed  COMPREHENSIVE METABOLIC PANEL - Abnormal; Notable for the following components:      Result Value   Creatinine, Ser 1.59 (*)    Calcium 8.4 (*)    AST 9 (*)    GFR, Estimated 47 (*)    All other components within normal limits  CBC WITH  DIFFERENTIAL/PLATELET - Abnormal; Notable for the following components:   RBC 4.07 (*)    All other components within normal limits  URINALYSIS, W/ REFLEX TO CULTURE (INFECTION SUSPECTED) - Abnormal; Notable for the following components:   APPearance HAZY (*)    Hgb urine dipstick TRACE (*)    Protein, ur 30 (*)    Nitrite POSITIVE (*)    Leukocytes,Ua SMALL (*)    Bacteria, UA FEW (*)    All other components within normal limits  URINE CULTURE    EKG None  Radiology MR Brain W Wo Contrast Result Date: 05/19/2023 CLINICAL DATA:  Provided history: Impairment of cognitive function. Abnormal neurological exam. Neuro deficit,  neuro degenerative disorder suspected. Impaired coordination. Cognitive dysfunction. EXAM: MRI HEAD WITHOUT AND WITH CONTRAST TECHNIQUE: Multiplanar, multiecho pulse sequences of the brain and surrounding structures were obtained without and with intravenous contrast. CONTRAST:  7.5mL GADAVIST  GADOBUTROL  1 MMOL/ML IV SOLN COMPARISON:  Report from head CT 12/25/1998 (images unavailable). FINDINGS: Brain: No age-advanced or lobar predominant parenchymal atrophy. Moderate for age multifocal T2 FLAIR hyperintense signal abnormality within the cerebral white matter. 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 No cortical encephalomalacia is identified. There is no acute infarct. No evidence of an intracranial mass. No chronic intracranial blood products. No extra-axial fluid collection. No midline shift. No pathologic intracranial enhancement identified. Vascular: Maintained flow voids within the proximal large arterial vessels. Skull and upper cervical spine: No focal worrisome marrow lesion. Sinuses/Orbits: No mass or acute finding within the imaged orbits. No significant paranasal sinus disease. IMPRESSION: 1.  No evidence of an acute intracranial abnormality. 2. Moderate for age multifocal T2 FLAIR hyperintense signal abnormality within the cerebral white  matter, nonspecific but most often secondary to chronic small vessel ischemia. 3. 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 4. Otherwise unremarkable MRI appearance of the brain. Electronically Signed   By: Rockey Childs D.O.   On: 05/19/2023 16:06    Procedures Procedures    Medications Ordered in ED Medications  cefTRIAXone  (ROCEPHIN ) 1 g in sodium chloride  0.9 % 100 mL IVPB (has no administration in time range)  HYDROmorphone  (DILAUDID ) injection 1 mg (1 mg Intravenous Given 05/19/23 2255)  iohexol  (OMNIPAQUE ) 300 MG/ML solution 100 mL (85 mLs Intravenous Contrast Given 05/19/23 2339)    ED Course/ Medical Decision Making/ A&P                                 Medical Decision Making Amount and/or Complexity of Data Reviewed Labs: ordered. Radiology: ordered.  Risk Prescription drug management.   Patient is a 69 year old male who presents with lower abdominal pain as well as testicular pain.  His testicular pain has been going on for about 3 to 4 days.  His urine does look consistent with infection.  He was given IV Rocephin .  Urine was sent for culture.  His other labs are nonconcerning.  His creatinine is elevated but based on chart review is similar to prior recent values.  He is awaiting a CT abdomen pelvis.  At this point were unable to do a testicular ultrasound given that there is no ultrasonographer here.  Given that the symptoms have been going on for 3 to 4 days and are bilateral, I have a low suspicion of torsion.  Discussed this with the patient.  Will hold off on emergent transfer for ultrasound now.  Will await CT findings and then make the decision at that time but patient may be able to have an ultrasound in the morning.  Care turned over to Dr. Trine pending CT results.  Final Clinical Impression(s) / ED Diagnoses Final diagnoses:  Urinary tract infection without hematuria, site unspecified  Lower abdominal pain  Pain in both testicles     Rx / DC Orders ED Discharge Orders     None         Lenor Hollering, MD 05/19/23 2342

## 2023-05-20 ENCOUNTER — Telehealth: Payer: Self-pay | Admitting: *Deleted

## 2023-05-20 ENCOUNTER — Inpatient Hospital Stay: Payer: 59

## 2023-05-20 ENCOUNTER — Ambulatory Visit: Payer: 59

## 2023-05-20 ENCOUNTER — Ambulatory Visit
Admission: RE | Admit: 2023-05-20 | Discharge: 2023-05-20 | Disposition: A | Discharge status: Home / Self Care | Payer: 59 | Source: Ambulatory Visit | Attending: Radiation Oncology | Admitting: Radiation Oncology

## 2023-05-20 ENCOUNTER — Other Ambulatory Visit: Payer: Self-pay

## 2023-05-20 ENCOUNTER — Other Ambulatory Visit: Payer: Self-pay | Admitting: Radiation Oncology

## 2023-05-20 DIAGNOSIS — C679 Malignant neoplasm of bladder, unspecified: Secondary | ICD-10-CM | POA: Diagnosis not present

## 2023-05-20 LAB — RAD ONC ARIA SESSION SUMMARY
Course Elapsed Days: 11
Plan Fractions Treated to Date: 10
Plan Prescribed Dose Per Fraction: 1.8 Gy
Plan Total Fractions Prescribed: 25
Plan Total Prescribed Dose: 45 Gy
Reference Point Dosage Given to Date: 18 Gy
Reference Point Session Dosage Given: 1.8 Gy
Session Number: 10

## 2023-05-20 MED ORDER — CEPHALEXIN 500 MG PO CAPS: 500.0000 mg | ORAL_CAPSULE | Freq: Four times a day (QID) | ORAL | 0 refills | Status: DC

## 2023-05-20 MED ORDER — OXYCODONE HCL 5 MG PO TABS
15.0000 mg | ORAL_TABLET | Freq: Once | ORAL | Status: AC
  Administered 2023-05-20: 15 mg via ORAL
  Filled 2023-05-20: qty 3

## 2023-05-20 MED ORDER — OXYCODONE HCL 20 MG PO TABS: 20.0000 mg | ORAL_TABLET | Freq: Four times a day (QID) | ORAL | 0 refills | Status: DC | PRN

## 2023-05-20 NOTE — Progress Notes (Signed)
 [facility] CSW Progress Note  Visual Merchandiser  spoke with patient's daughter on the phone  to discuss SNF placement. At this time patient will not meet criteria for SNF placement due to lack of challenges with ADLs. Patient is interested in other housing. CSW will attempt to locate list serve for affordable housing options and send to patient.  Lizbeth Sprague, LCSW Clinical Social Worker [facility]

## 2023-05-20 NOTE — Telephone Encounter (Signed)
 Notified of message below

## 2023-05-20 NOTE — ED Provider Notes (Signed)
 I assumed care of this patient from previous provider.  Please see their note for further details of history, exam, and MDM.   Briefly patient is a 68 y.o. male who presented with testicular pain.  Workup notable for likely urinary tract infection and patient given IV Rocephin .  Currently awaiting CT scan to rule out any serious intra-abdominal processes or significant changes in the scrotal region.  CT scan negative for any acute findings in the abdomen.  Notable for known postoperative changes.  Testicles and scrotum are within normal limits.  No evidence of inflammation or deep infection.  DC with oral antibiotics.       Trine Raynell Moder, MD 05/20/23 [phone]

## 2023-05-20 NOTE — Telephone Encounter (Signed)
-----   Message from [name] sent at 05/19/2023  9:55 PM EST ----- Todd [name] would you let daughter know MRI did not show cancer spread there. That's good news. Thanks.

## 2023-05-22 LAB — URINE CULTURE: Culture: >=100000 — AB

## 2023-05-23 ENCOUNTER — Ambulatory Visit: Payer: 59

## 2023-05-23 ENCOUNTER — Other Ambulatory Visit: Payer: Self-pay

## 2023-05-23 ENCOUNTER — Ambulatory Visit
Admission: RE | Admit: 2023-05-23 | Discharge: 2023-05-23 | Disposition: A | Discharge status: Home / Self Care | Payer: 59 | Source: Ambulatory Visit | Attending: Radiation Oncology

## 2023-05-23 DIAGNOSIS — C679 Malignant neoplasm of bladder, unspecified: Secondary | ICD-10-CM | POA: Diagnosis not present

## 2023-05-23 LAB — RAD ONC ARIA SESSION SUMMARY
Course Elapsed Days: 14
Plan Fractions Treated to Date: 11
Plan Prescribed Dose Per Fraction: 1.8 Gy
Plan Total Fractions Prescribed: 25
Plan Total Prescribed Dose: 45 Gy
Reference Point Dosage Given to Date: 19.8 Gy
Reference Point Session Dosage Given: 1.8 Gy
Session Number: 11

## 2023-05-24 ENCOUNTER — Other Ambulatory Visit: Payer: Self-pay

## 2023-05-24 ENCOUNTER — Ambulatory Visit
Admission: RE | Admit: 2023-05-24 | Discharge: 2023-05-24 | Disposition: A | Discharge status: Home / Self Care | Payer: 59 | Source: Ambulatory Visit | Attending: Radiation Oncology | Admitting: Radiation Oncology

## 2023-05-24 ENCOUNTER — Ambulatory Visit: Payer: 59

## 2023-05-24 DIAGNOSIS — C679 Malignant neoplasm of bladder, unspecified: Secondary | ICD-10-CM | POA: Diagnosis not present

## 2023-05-24 LAB — RAD ONC ARIA SESSION SUMMARY
Course Elapsed Days: 15
Plan Fractions Treated to Date: 12
Plan Prescribed Dose Per Fraction: 1.8 Gy
Plan Total Fractions Prescribed: 25
Plan Total Prescribed Dose: 45 Gy
Reference Point Dosage Given to Date: 21.6 Gy
Reference Point Session Dosage Given: 1.8 Gy
Session Number: 12

## 2023-05-25 ENCOUNTER — Ambulatory Visit: Payer: 59

## 2023-05-25 ENCOUNTER — Other Ambulatory Visit: Payer: Self-pay

## 2023-05-25 ENCOUNTER — Ambulatory Visit
Admission: RE | Admit: 2023-05-25 | Discharge: 2023-05-25 | Disposition: A | Discharge status: Home / Self Care | Payer: 59 | Source: Ambulatory Visit | Attending: Radiation Oncology | Admitting: Radiation Oncology

## 2023-05-25 DIAGNOSIS — C679 Malignant neoplasm of bladder, unspecified: Secondary | ICD-10-CM | POA: Diagnosis not present

## 2023-05-25 LAB — RAD ONC ARIA SESSION SUMMARY
Course Elapsed Days: 16
Plan Fractions Treated to Date: 13
Plan Prescribed Dose Per Fraction: 1.8 Gy
Plan Total Fractions Prescribed: 25
Plan Total Prescribed Dose: 45 Gy
Reference Point Dosage Given to Date: 23.4 Gy
Reference Point Session Dosage Given: 1.8 Gy
Session Number: 13

## 2023-05-26 ENCOUNTER — Ambulatory Visit: Payer: 59

## 2023-05-26 ENCOUNTER — Ambulatory Visit
Admission: RE | Admit: 2023-05-26 | Discharge: 2023-05-26 | Disposition: A | Discharge status: Home / Self Care | Payer: 59 | Source: Ambulatory Visit | Attending: Radiation Oncology | Admitting: Radiation Oncology

## 2023-05-26 ENCOUNTER — Other Ambulatory Visit: Payer: Self-pay

## 2023-05-26 DIAGNOSIS — C679 Malignant neoplasm of bladder, unspecified: Secondary | ICD-10-CM | POA: Diagnosis not present

## 2023-05-26 LAB — RAD ONC ARIA SESSION SUMMARY
Course Elapsed Days: 17
Plan Fractions Treated to Date: 14
Plan Prescribed Dose Per Fraction: 1.8 Gy
Plan Total Fractions Prescribed: 25
Plan Total Prescribed Dose: 45 Gy
Reference Point Dosage Given to Date: 25.2 Gy
Reference Point Session Dosage Given: 1.8 Gy
Session Number: 14

## 2023-05-27 ENCOUNTER — Ambulatory Visit
Admission: RE | Admit: 2023-05-27 | Discharge: 2023-05-27 | Disposition: A | Discharge status: Home / Self Care | Payer: 59 | Source: Ambulatory Visit | Attending: Radiation Oncology | Admitting: Radiation Oncology

## 2023-05-27 ENCOUNTER — Ambulatory Visit: Payer: 59

## 2023-05-27 ENCOUNTER — Other Ambulatory Visit: Payer: Self-pay

## 2023-05-27 DIAGNOSIS — C679 Malignant neoplasm of bladder, unspecified: Secondary | ICD-10-CM | POA: Diagnosis not present

## 2023-05-27 LAB — RAD ONC ARIA SESSION SUMMARY
Course Elapsed Days: 18
Plan Fractions Treated to Date: 15
Plan Prescribed Dose Per Fraction: 1.8 Gy
Plan Total Fractions Prescribed: 25
Plan Total Prescribed Dose: 45 Gy
Reference Point Dosage Given to Date: 27 Gy
Reference Point Session Dosage Given: 1.8 Gy
Session Number: 15

## 2023-05-30 ENCOUNTER — Other Ambulatory Visit: Payer: Self-pay

## 2023-05-30 ENCOUNTER — Ambulatory Visit
Admission: RE | Admit: 2023-05-30 | Discharge: 2023-05-30 | Disposition: A | Discharge status: Home / Self Care | Payer: 59 | Source: Ambulatory Visit | Attending: Radiation Oncology | Admitting: Radiation Oncology

## 2023-05-30 ENCOUNTER — Ambulatory Visit: Payer: 59

## 2023-05-30 ENCOUNTER — Other Ambulatory Visit: Payer: Self-pay | Admitting: Radiation Oncology

## 2023-05-30 DIAGNOSIS — C679 Malignant neoplasm of bladder, unspecified: Secondary | ICD-10-CM

## 2023-05-30 LAB — RAD ONC ARIA SESSION SUMMARY
Course Elapsed Days: 21
Plan Fractions Treated to Date: 16
Plan Prescribed Dose Per Fraction: 1.8 Gy
Plan Total Fractions Prescribed: 25
Plan Total Prescribed Dose: 45 Gy
Reference Point Dosage Given to Date: 28.8 Gy
Reference Point Session Dosage Given: 1.8 Gy
Session Number: 16

## 2023-05-30 MED ORDER — OXYCODONE HCL 20 MG PO TABS: 20.0000 mg | ORAL_TABLET | Freq: Four times a day (QID) | ORAL | 0 refills | Status: DC | PRN

## 2023-05-30 NOTE — Progress Notes (Deleted)
 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    The patient understands the plans discussed today and is in agreement with them.  He knows to contact our office if he develops concerns prior to his next appointment.  [name], MD  [facility] - A DEPT OF [facility] [address] [place] [address] Dept: [phone] Dept Fax: [phone]   No orders of the defined types were placed in this encounter.     CHIEF COMPLAINT:  CC: ***  Current Treatment:  ***  INTERVAL HISTORY:  Todd [name] is here today for repeat clinical assessment. He denies fevers or chills. He denies pain. His appetite is good. His weight {Weight change:10426}.  I have reviewed the past medical history, past surgical history, social history and family history with the patient and they are unchanged from previous note.  ALLERGIES:  is allergic to clindamycin/lincomycin, milk-related compounds, and hydrocodone.  MEDICATIONS:  Current Outpatient Medications  Medication Sig Dispense Refill   albuterol (PROVENTIL HFA;VENTOLIN HFA) 108 (90 BASE) MCG/ACT inhaler Inhale 1 puff into the lungs every 6 (six) hours as needed for wheezing or shortness of breath.     budesonide-formoterol (SYMBICORT) 160-4.5 MCG/ACT inhaler Inhale 2 puffs into the lungs 2 (two) times daily as needed (for respiratory flares).     cephALEXin (KEFLEX) 500 MG capsule Take 1 capsule (500 mg total) by mouth 4 (four)  times daily. 20 capsule 0   clonazePAM (KLONOPIN) 1 MG tablet Take 1 mg by mouth 3 (three) times daily as needed for anxiety. (Patient not taking: Reported on 03/03/2023)  0   gabapentin (NEURONTIN) 100 MG capsule Take 100 mg by mouth 3 (three) times daily.     Ibuprofen (ADVIL) 200 MG CAPS Take 200-800 mg by mouth every 6 (six) hours as needed (for pain or headaches).     Na Sulfate-K Sulfate-Mg Sulf 17.5-3.13-1.6 GM/177ML SOLN Suprep (no substitutions)-TAKE AS DIRECTED. 354 mL 0   ondansetron (ZOFRAN) 8 MG tablet Take 1 tablet (8 mg total) by mouth every 8 (eight) hours as needed for nausea or vomiting. 30 tablet 1   Oxycodone HCl 20 MG TABS Take 1 tablet (20 mg total) by mouth every 6 (six) hours as needed. 45 tablet 0   pantoprazole (PROTONIX) 40 MG tablet Take 1 tablet (40 mg total) by mouth 2 (two) times daily. (Patient taking differently: Take 40 mg by mouth daily as needed (for reflux).) 180 tablet 0   prochlorperazine (COMPAZINE) 10 MG tablet Take 1 tablet (10 mg total) by mouth every 6 (six) hours as needed for nausea or vomiting. 30 tablet 1   valsartan-hydrochlorothiazide (DIOVAN-HCT) 160-12.5 MG tablet Take 1 tablet by mouth daily.     vitamin B-12 (CYANOCOBALAMIN) 500 MCG tablet Take 500 mcg by mouth daily.     No current facility-administered medications for this visit.    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  2016-2018 He then had multiple other TURBTs with intermittent adherence/tolerability to BCG     05/27/2014 Procedure   Repeat TURBT, path again with LgTa. Only completed 2/6 BCG treatments, then lost to follow-up.    06/09/2015 Procedure   Repeat TURBT with 3 papillary tumors, pathology with LgTa and CIS.    06/2015 Imaging   CT A/P with diffuse bladder wall thickening,  no definitive evidence of metastatic disease    07/21/2015 Procedure   Repeat TURBT with path showing high grade dysplasia. Again scheduled for BCG but only completed 1-2 doses.    10/19/2016 Procedure   Repeat TURBT with low grade papillary urothelial carcinoma/CIS. Received 4/6 doses of BCG. Lost to follow-up.    03/09/2019 Imaging   CT CAP with LUL 2cm GGO, minimal right urinary bladder wall thickening of 4mm    01/22/2020 Imaging   CT A/P in the ED for N/V/D/weight loss. Bladder thickening more pronounced at 7mm.    06-27-20 Imaging   CT urogram for hematuria showed new right lateral masslike thickening of bladder wall, measure 8mm. No evidence metastatic disease.    08/13/2020 Procedure   Repeat TURBT with invasive high-grade urothelial carcinoma with nonfocal invasion into lamina propria (CIS present), second specimen with invasive high-grade urothelial carcinoma extensively involving lamina propria, with one fragment showing involvement of muscle bundles which are equivocal for muscularis mucosae or muscularis propria (CIS again present).    09/19/2020 Procedure   Repeat TURBT for re-resection with invasive high-grade urothelial carcinoma with non-focal lamina propria invasion and CIS, no involvement of muscularis propria    11/20/2020 - 09/17/2021 Chemotherapy   C1 - 8 Pembrolizumab    12/05/2022 Imaging   MRI pelvis wo contrast -Increased size of right posterolateral bladder wall mass, now measuring up to 3.2 cm, previously 1.2 cm, with extramural extension of the mass encasing the distal right ureter and resulting in upstream right ureteral dilation. The extravesicular extension of the bladder mass also is in close contact with the vas deferens near the seminal vesicles for which abutment or invasion cannot be excluded.     01/03/2023 Imaging   CT AP -Increasing right posterior lateral bladder wall thickening, concerning for disease progression. New right ureteral obstruction  associated hydroureteronephrosis.  -Multiple subcentimeter hypoattenuating foci within the liver, favored to be benign given stability since 2016.   CT Chest: No intrathoracic metastasis with stable findings since December 28.     01/17/2023 PET scan   PET from [facility] - Thickening of the right posterolateral bladder wall, similar to prior CT dated 01/03/2023.  - Minimally FDG avid bilateral inguinal lymph nodes which do not appear pathologically enlarged or abnormal in appearance. These are favored to be reactive.  -There is a possible mildly hypermetabolic nodule along the posterior wall of the rectum which is not completely characterized on this study. Suggest correlation with colonoscopy, which patient has scheduled 01/20/2023.    02/07/2023 Pathology Results   A: Ureter margin, right distal, biopsy - Benign ureter with chronic inflammation - Negative for malignancy   B: Ureter margin, left distal, biopsy - Focal atypia, worrisome for focal involvement by urothelial carcinoma in situ   C: Bladder and prostate, cystoprostatectomy   BLADDER - Multifocal invasive high grade papillary urothelial carcinoma              - Dominant tumor is in right posterior upper bladder wall, 3.0 cm, invasive through muscularis  propria with macroscopic invasion of peri-vesicle adipose tissue              - Satellite tumor in left bladder extending into left ureter, 0.9 cm, invasive into lamina propria - Extensive multifocal high grade papillary urothelial carcinoma and urothelial carcinoma in situ present in all random sections of the bladder mucosa (anterior, posterior, dome, trigone, right lateral, left lateral walls) away from invasive cancers - 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 (contiguous with carcinoma in situ of trigone), distal margin uninvolved - Margins of resection negative for invasive carcinoma - Left ureter margin of this specimen shows urothelial carcinoma in situ,  superceded by additional specimens, final left ureter margin is in specimen F, which shows patchy atypia 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 - See synoptic report    PROSTATE - 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 and involves prostatic ducts - Prostatic stroma with no evidence of involvement by urothelial carcinoma, negative for prostate carcinoma - Prostatic urethral distal margin negative   D: Lymph node, right pelvic, biopsy - Five lymph nodes with no evidence of malignancy (0/5)    E: Lymph node, left pelvic, biopsy - Metastatic carcinoma present in one of five lymph nodes (1/5), size of metastatic deposit 1.4 mm, without extranodal extension   F: True ureteral margin, left distal, biopsy - 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 - Negative for invasive carcinoma   G: True ureteral margin, right distal, biopsy - Benign ureter with chronic inflammation - Negative for malignancy  SPECIMEN    Procedure:    Radical cystoprostatectomy   TUMOR    Tumor Site:    Right lateral wall     Tumor Site:    Left lateral wall     Tumor Site:    Posterior wall     Histologic Type:    Urothelial carcinoma, invasive (conventional)     Histologic Grade:    High-grade     Tumor Size:    Greatest Dimension (Centimeters): 3.0 cm       Additional Dimension (Centimeters):    2.9 cm       Additional Dimension (Centimeters):    2.8 cm     Tumor Extent:    Invades perivesical soft tissue microscopically     Lymphatic and / or Vascular Invasion:    Not identified     Tumor Configuration:    Papillary     Tumor Configuration:    Solid / nodule     Tumor Configuration:    Flat     Treatment Effect Post Neoadjuvant Chemotherapy:    Weak and no response: residual cancer cells occupying greater than or equal to 50% of the tumor bed or absence of regressive changes (TRG3)     Tumor Comment:    Appears to have received 1 dose of pembrolizumab,  no apparent histologic response.   MARGINS    Margin Status for Invasive Tumor:    All margins negative for invasive tumor       Closest Margin(s) to Invasive tumor:    Soft tissue: right peri-vesicular margin       Distance from Invasive Tumor to Closest Margin:    13 mm     Margin Status for Carcinoma in Situ / Noninvasive Papillary Urothelial Carcinoma:    Carcinoma in situ / noninvasive papillary urothelial carcinoma present at margin       Margin(s) Involved by Carcinoma in Situ / Noninvasive Papillary  Urothelial Carcinoma:    Left ureteral   REGIONAL LYMPH NODES     Regional Lymph Node Status:          :    Tumor present in regional lymph node(s)         Number of Lymph Nodes with Tumor:    1         Size of Largest Nodal Metastatic Deposit:    0.14 cm         Nodal Site with Largest Metastatic Deposit:    Left pelvic         Size of Largest Lymph Node with Tumor:    0.7 cm         Largest Lymph Node with Tumor:    left pelvic         Extranodal Extension (ENE):    Not identified       Number of Lymph Nodes Examined:    10   pTNM CLASSIFICATION (AJCC 8th Edition)     Reporting of pT, pN, and (when applicable) pM categories is based on information available to the pathologist at the time the report is issued. As per the AJCC (Chapter 1, 8th Ed.) it is the managing physician's responsibility to establish the final pathologic stage based upon all pertinent information, including but potentially not limited to this pathology report.     Modified Classification:    y     pT Category:    pT3a     T Suffix:    (m)     pN Category:    pN1    02/20/2023 Procedure   Colonoscopy Impression:    - The examined portion of the ileum was normal.                         - The entire examined colon is normal on direct and retroflexion views.                         - Views were limited by liquid stool throughout the colon and difficulty suctioning the stool.                         - No specimens  collected.    02/23/2023 Imaging   CT AP Hepatobiliary: Hypodensities in liver consistent benign cysts. Postcholecystectomy. Mild extrahepatic biliary duct dilatation not changed from prior.   04/06/2023 Imaging   CT AP from [facility] Relative hypoenhancement of the right kidney when compared to the left which is nonspecific. This could be related to altered perfusion or infection/pyelonephritis. No significant hydronephrosis is identified. Clinical correlation is recommended.  LIVER: Normal liver contour. Subcentimeter hypoattenuating hepatic foci too small to characterize further on CT.    05/10/2023 Cancer Staging   Staging form: Urinary Bladder, AJCC 8th Edition - Clinical: Stage IIIA (cT3, cN1, cM0) - Signed by [name], MD on 05/10/2023 WHO/ISUP grade (low/high): High Grade Histologic grading system: 2 grade system   05/31/2023 -  Chemotherapy   Patient is on Treatment Plan : BLADDER Nivolumab (240) q14d         REVIEW OF SYSTEMS:   All relevant systems were reviewed with the patient and are negative.   VITALS:  There were no vitals taken for this visit.  Wt Readings from Last 3 Encounters:  05/12/23 180 lb 4.8 oz (81.8 kg)  03/14/23 170 lb 6.4 oz (77.3  kg)  03/03/23 172 lb 9.6 oz (78.3 kg)    There is no height or weight on file to calculate BMI.  Performance status (ECOG): {CHL ONC [id number]  PHYSICAL EXAM:   GENERAL:alert, no distress and comfortable SKIN: skin color normal, no rashes  EYES: normal, sclera clear OROPHARYNX: no exudate, no erythema    NECK: supple,  non-tender, without nodularity LYMPH:  no palpable cervical lymphadenopathy LUNGS: clear to auscultation with normal breathing effort.  No wheeze or rales HEART: regular rate & rhythm and no murmurs and no lower extremity edema ABDOMEN: abdomen soft, non-tender and nondistended Musculoskeletal: no edema NEURO: alert, fluent speech, no focal motor/sensory deficits.  Strength and sensation equal  bilaterally.  LABORATORY DATA:  I have reviewed the data as listed    Component Value Date/Time   NA 138 05/19/2023 2246   NA 140 01/29/2014 0937   K 4.5 05/19/2023 2246   K 4.1 01/29/2014 0937   CL 108 05/19/2023 2246   CO2 23 05/19/2023 2246   CO2 26 01/29/2014 0937   GLUCOSE 90 05/19/2023 2246   GLUCOSE 93 01/29/2014 0937   BUN 21 05/19/2023 2246   BUN 16.1 01/29/2014 0937   CREATININE 1.59 (H) 05/19/2023 2246   CREATININE 2.07 (H) 03/18/2023 0754   CREATININE 1.0 01/29/2014 0937   CALCIUM 8.4 (L) 05/19/2023 2246   CALCIUM 9.0 01/29/2014 0937   PROT 6.5 05/19/2023 2246   PROT 6.6 01/29/2014 0937   ALBUMIN 3.8 05/19/2023 2246   ALBUMIN 3.2 (L) 01/29/2014 0937   AST 9 (L) 05/19/2023 2246   AST 10 (L) 03/18/2023 0754   AST 27 01/29/2014 0937   ALT 5 05/19/2023 2246   ALT 7 03/18/2023 0754   ALT 29 01/29/2014 0937   ALKPHOS 48 05/19/2023 2246   ALKPHOS 86 01/29/2014 0937   BILITOT 0.3 05/19/2023 2246   BILITOT 0.4 03/18/2023 0754   BILITOT 0.36 01/29/2014 0937   GFRNONAA 47 (L) 05/19/2023 2246   GFRNONAA 35 (L) 03/18/2023 0754   GFRAA 57 (L) 09/20/2014 1352    No results found for: "SPEP", "UPEP"  Lab Results  Component Value Date   WBC 4.6 05/19/2023   NEUTROABS 2.5 05/19/2023   HGB 13.3 05/19/2023   HCT 40.4 05/19/2023   MCV 99.3 05/19/2023   PLT 166 05/19/2023      Chemistry      Component Value Date/Time   NA 138 05/19/2023 2246   NA 140 01/29/2014 0937   K 4.5 05/19/2023 2246   K 4.1 01/29/2014 0937   CL 108 05/19/2023 2246   CO2 23 05/19/2023 2246   CO2 26 01/29/2014 0937   BUN 21 05/19/2023 2246   BUN 16.1 01/29/2014 0937   CREATININE 1.59 (H) 05/19/2023 2246   CREATININE 2.07 (H) 03/18/2023 0754   CREATININE 1.0 01/29/2014 0937      Component Value Date/Time   CALCIUM 8.4 (L) 05/19/2023 2246   CALCIUM 9.0 01/29/2014 0937   ALKPHOS 48 05/19/2023 2246   ALKPHOS 86 01/29/2014 0937   AST 9 (L) 05/19/2023 2246   AST 10 (L) 03/18/2023 0754    AST 27 01/29/2014 0937   ALT 5 05/19/2023 2246   ALT 7 03/18/2023 0754   ALT 29 01/29/2014 0937   BILITOT 0.3 05/19/2023 2246   BILITOT 0.4 03/18/2023 0754   BILITOT 0.36 01/29/2014 0937       RADIOGRAPHIC STUDIES: I have personally reviewed the radiological images as listed and agreed with the findings in the report.  CT ABDOMEN PELVIS W CONTRAST Result Date: 05/19/2023 CLINICAL DATA:  Acute nonlocalized abdominal pain. Currently having radiation. Bladder cancer. Pain and tenderness of the testicles for 3 days. EXAM: CT ABDOMEN AND PELVIS WITH CONTRAST TECHNIQUE: Multidetector CT imaging of the abdomen and pelvis was performed using the standard protocol following bolus administration of intravenous contrast. RADIATION DOSE REDUCTION: This exam was performed according to the departmental dose-optimization program which includes automated exposure control, adjustment of the mA and/or kV according to patient size and/or use of iterative reconstruction technique. CONTRAST:  85mL OMNIPAQUE IOHEXOL 300 MG/ML  SOLN COMPARISON:  02/23/2023 FINDINGS: Lower chest: Lung bases are clear. Hepatobiliary: No focal liver abnormality is seen. Status post cholecystectomy. No biliary dilatation. Pancreas: Unremarkable. No pancreatic ductal dilatation or surrounding inflammatory changes. Spleen: Normal in size without focal abnormality. Adrenals/Urinary Tract: No adrenal gland nodules. Mild diffuse right renal atrophy. Mild delay of right nephrogram compared to the left. No hydronephrosis or hydroureter. Bladder is surgically absent. Right lower quadrant urinary conduit appears patent. Stomach/Bowel: Stomach, small bowel, and colon are not abnormally distended. No wall thickening or inflammatory changes are appreciated. Stool throughout the colon. Appendix is not identified. Vascular/Lymphatic: Aortic atherosclerosis. No enlarged abdominal or pelvic lymph nodes. Reproductive: Prostate gland appears surgically absent.  No significant pelvic lymphadenopathy. Testicles are not enlarged. No scrotal skin thickening. Other: Postoperative scarring in the anterior abdominal wall. No anterior abdominal wall hernias. No free air or free fluid in the abdomen. Musculoskeletal: Postoperative fixation of the lumbosacral spine. Mild degenerative changes. No focal bone lesions. IMPRESSION: 1. Surgical resection of the bladder and prostate with ileal conduit present in the right lower quadrant. 2. No evidence of metastatic disease. 3. Mild delay of nephrogram on the right without hydronephrosis, possibly renovascular disease. 4. Aortic atherosclerosis. Electronically Signed   By: Burman Nieves M.D.   On: 05/19/2023 23:53   MR Brain W Wo Contrast Result Date: 05/19/2023 CLINICAL DATA:  Provided history: Impairment of cognitive function. Abnormal neurological exam. Neuro deficit, neuro degenerative disorder suspected. Impaired coordination. Cognitive dysfunction. EXAM: MRI HEAD WITHOUT AND WITH CONTRAST TECHNIQUE: Multiplanar, multiecho pulse sequences of the brain and surrounding structures were obtained without and with intravenous contrast. CONTRAST:  7.45mL GADAVIST GADOBUTROL 1 MMOL/ML IV SOLN COMPARISON:  Report from head CT 12/25/1998 (images unavailable). FINDINGS: Brain: No age-advanced or lobar predominant parenchymal atrophy. Moderate for age multifocal T2 FLAIR hyperintense signal abnormality within the cerebral white matter. 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 No cortical encephalomalacia is identified. There is no acute infarct. No evidence of an intracranial mass. No chronic intracranial blood products. No extra-axial fluid collection. No midline shift. No pathologic intracranial enhancement identified. Vascular: Maintained flow voids within the proximal large arterial vessels. Skull and upper cervical spine: No focal worrisome marrow lesion. Sinuses/Orbits: No mass or acute finding within the  imaged orbits. No significant paranasal sinus disease. IMPRESSION: 1.  No evidence of an acute intracranial abnormality. 2. Moderate for age multifocal T2 FLAIR hyperintense signal abnormality within the cerebral white matter, nonspecific but most often secondary to chronic small vessel ischemia. 3. 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 4. Otherwise unremarkable MRI appearance of the brain. Electronically Signed   By: Jackey Loge D.O.   On: 05/19/2023 16:06

## 2023-05-31 ENCOUNTER — Ambulatory Visit: Payer: 59

## 2023-05-31 ENCOUNTER — Inpatient Hospital Stay: Payer: 59

## 2023-05-31 ENCOUNTER — Other Ambulatory Visit: Payer: Self-pay

## 2023-05-31 ENCOUNTER — Telehealth: Payer: Self-pay

## 2023-05-31 ENCOUNTER — Ambulatory Visit
Admission: RE | Admit: 2023-05-31 | Discharge: 2023-05-31 | Disposition: A | Discharge status: Home / Self Care | Payer: 59 | Source: Ambulatory Visit | Attending: Radiation Oncology

## 2023-05-31 DIAGNOSIS — C679 Malignant neoplasm of bladder, unspecified: Secondary | ICD-10-CM | POA: Diagnosis not present

## 2023-05-31 LAB — RAD ONC ARIA SESSION SUMMARY
Course Elapsed Days: 22
Plan Fractions Treated to Date: 17
Plan Prescribed Dose Per Fraction: 1.8 Gy
Plan Total Fractions Prescribed: 25
Plan Total Prescribed Dose: 45 Gy
Reference Point Dosage Given to Date: 30.6 Gy
Reference Point Session Dosage Given: 1.8 Gy
Session Number: 17

## 2023-05-31 NOTE — Telephone Encounter (Signed)
Tried to call pt but he did not answer. Spoke to pts. Daughter Orlo Brickle) to remind her of her fathers appointments. They were unaware of any appts scheduled today (05/31/2023) other than their radiation treatment appointment. Please give his daughter a call because she seemed a bit confused about what was going on.

## 2023-05-31 NOTE — Progress Notes (Signed)
Recent CT showed no evidence of metastatic disease.  Patient should continue adjuvant radiation and complete as stable.  Follow-up about 4 to 5 weeks after completion of treatment with me.  Called Todd [name] but no answer.  Called [name] and spoke to her about follow-up plan and CT findings.

## 2023-06-01 ENCOUNTER — Telehealth: Payer: Self-pay

## 2023-06-01 ENCOUNTER — Ambulatory Visit: Payer: 59

## 2023-06-01 ENCOUNTER — Other Ambulatory Visit: Payer: Self-pay

## 2023-06-01 ENCOUNTER — Ambulatory Visit
Admission: RE | Admit: 2023-06-01 | Discharge: 2023-06-01 | Disposition: A | Discharge status: Home / Self Care | Payer: 59 | Source: Ambulatory Visit | Attending: Radiation Oncology | Admitting: Radiation Oncology

## 2023-06-01 DIAGNOSIS — C679 Malignant neoplasm of bladder, unspecified: Secondary | ICD-10-CM | POA: Diagnosis not present

## 2023-06-01 LAB — RAD ONC ARIA SESSION SUMMARY
Course Elapsed Days: 23
Plan Fractions Treated to Date: 18
Plan Prescribed Dose Per Fraction: 1.8 Gy
Plan Total Fractions Prescribed: 25
Plan Total Prescribed Dose: 45 Gy
Reference Point Dosage Given to Date: 32.4 Gy
Reference Point Session Dosage Given: 1.8 Gy
Session Number: 18

## 2023-06-01 MED ORDER — LOPERAMIDE HCL 2 MG PO CAPS: 2.0000 mg | ORAL_CAPSULE | ORAL | 0 refills | Status: DC | PRN

## 2023-06-01 MED ORDER — LOPERAMIDE HCL 2 MG PO CAPS
2.0000 mg | ORAL_CAPSULE | ORAL | 0 refills | Status: DC | PRN
Start: 2023-06-01 — End: 2023-06-01

## 2023-06-01 MED ORDER — OXYCODONE HCL 20 MG PO TABS: 20.0000 mg | ORAL_TABLET | ORAL | 0 refills | Status: DC | PRN

## 2023-06-01 NOTE — Progress Notes (Addendum)
[name] came in for daily treatment of bladder request something for diarrhea was told he could get loperamide at local pharmacy over the counter.  [name] expressed that he has limited funding and that medication will need to be prescribed in order for him to get he has grant money to cover costs.  [name] also stated he spoke with Dr. Kathrynn Running last visit and that pain medication was to be sent in and he hadn't received it.  RN checked into it and prescription was sent to [facility] in [facility].  [name] is unable to pick up that prescription and requested that it be sent to [facility] on [address] in [place].  [facility] called [facility] in [place] to cancel script and to call in order to [facility] in [facility], [place]-C made aware of changes.  Both Lopermide 2 mg tab po as needed for diarrhea or loose stools 30 tabs (may titrate up to 8 tabs in a day) no refills and Oxycodone HCL 20 mg po take q6h prn for pain, 45 tabs no refills to be called in to [facility] pharmacy on [address] this morning.  [name] was made aware.  RN reach out to social worker Arvid Right about [name] request for housing assistance she will be following up with him today.

## 2023-06-01 NOTE — Telephone Encounter (Signed)
Rescheduled appointments per scheduling message. Patient is aware of the changes made.
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
## 2023-06-01 NOTE — Telephone Encounter (Signed)
TC from pt, reporting that Dr. Kathrynn Running called his oxycodone Rx into the wrong pharmacy (in [facility]) on 05/30/23. He states he needs the Rx sent to [facility] on [address]. Upon chart review, did not see an Rx for oxycodone submitted by Dr. Kathrynn Running, but advised pt that I would let him know.

## 2023-06-01 NOTE — Telephone Encounter (Signed)
[facility] Clinical Social Work  Clinical Social Work was referred by nurse for assessment of psychosocial needs (housing and ostomy bags).  Clinical Social Worker contacted patient by phone to offer support and assess for needs. Patient was unable to speak at time of call and requested to see CSW in person and transportation assistance. Referral sent to Transportation Coordinator and Appointment has been scheduled.   Marguerita Merles, LCSW  Clinical Social Worker [facility]

## 2023-06-01 NOTE — Addendum Note (Signed)
Addended by: Margaretmary Dys on: 06/01/2023 04:56 PM   Modules accepted: Orders

## 2023-06-02 ENCOUNTER — Ambulatory Visit: Payer: 59

## 2023-06-02 ENCOUNTER — Ambulatory Visit
Admission: RE | Admit: 2023-06-02 | Discharge: 2023-06-02 | Disposition: A | Discharge status: Home / Self Care | Payer: 59 | Source: Ambulatory Visit | Attending: Radiation Oncology | Admitting: Radiation Oncology

## 2023-06-02 ENCOUNTER — Other Ambulatory Visit: Payer: Self-pay | Admitting: Urology

## 2023-06-02 ENCOUNTER — Other Ambulatory Visit: Payer: Self-pay

## 2023-06-02 DIAGNOSIS — C679 Malignant neoplasm of bladder, unspecified: Secondary | ICD-10-CM | POA: Diagnosis not present

## 2023-06-02 LAB — RAD ONC ARIA SESSION SUMMARY
Course Elapsed Days: 24
Plan Fractions Treated to Date: 19
Plan Prescribed Dose Per Fraction: 1.8 Gy
Plan Total Fractions Prescribed: 25
Plan Total Prescribed Dose: 45 Gy
Reference Point Dosage Given to Date: 34.2 Gy
Reference Point Session Dosage Given: 1.8 Gy
Session Number: 19

## 2023-06-02 MED ORDER — OXYCODONE HCL 20 MG PO TABS: 20.0000 mg | ORAL_TABLET | ORAL | 0 refills | Status: AC | PRN

## 2023-06-03 ENCOUNTER — Ambulatory Visit: Payer: 59

## 2023-06-03 ENCOUNTER — Ambulatory Visit
Admission: RE | Admit: 2023-06-03 | Discharge: 2023-06-03 | Disposition: A | Discharge status: Home / Self Care | Payer: 59 | Source: Ambulatory Visit | Attending: Radiation Oncology | Admitting: Radiation Oncology

## 2023-06-03 ENCOUNTER — Other Ambulatory Visit: Payer: Self-pay | Admitting: Radiation Oncology

## 2023-06-03 ENCOUNTER — Inpatient Hospital Stay: Payer: 59

## 2023-06-03 ENCOUNTER — Other Ambulatory Visit: Payer: Self-pay

## 2023-06-03 ENCOUNTER — Encounter: Payer: Self-pay | Admitting: *Deleted

## 2023-06-03 DIAGNOSIS — C679 Malignant neoplasm of bladder, unspecified: Secondary | ICD-10-CM | POA: Diagnosis not present

## 2023-06-03 LAB — RAD ONC ARIA SESSION SUMMARY
Course Elapsed Days: 25
Plan Fractions Treated to Date: 20
Plan Prescribed Dose Per Fraction: 1.8 Gy
Plan Total Fractions Prescribed: 25
Plan Total Prescribed Dose: 45 Gy
Reference Point Dosage Given to Date: 36 Gy
Reference Point Session Dosage Given: 1.8 Gy
Session Number: 20

## 2023-06-03 MED ORDER — CLONAZEPAM 1 MG PO TABS: 1.0000 mg | ORAL_TABLET | Freq: Three times a day (TID) | ORAL | 0 refills | Status: DC | PRN

## 2023-06-03 NOTE — Progress Notes (Signed)
[facility] CSW Progress Note  Visual merchandiser met with patient prior to rad onc appointment. Patient expressed frustrations with housing situation and stated "this isn't a life worth living", CSW assessed SI / HI , no risk identified. CSW informed patient of limited housing options and encouraged patient to continue steps he is already taking. Patient expressed desire for community and connection, CSW provided information for 55+ rec center, patient was grateful stating "this will help me". Patient inquired if [place] had housing for patients, CSW informed him no. Patient stated where do people with suicidal ideations go, CSW informed him that CSW would escort him to the ED for evaluation should the need arrise, again no SI risk. Patient inquired about anti-depressants, CSW informed patient that he could inquire with Rad Onc provider, however [facility] providers tend to refer out to PCP.     Marguerita Merles, LCSW Clinical Social Worker [facility]

## 2023-06-06 ENCOUNTER — Other Ambulatory Visit: Payer: Self-pay

## 2023-06-06 ENCOUNTER — Ambulatory Visit: Payer: 59

## 2023-06-06 ENCOUNTER — Inpatient Hospital Stay: Payer: 59

## 2023-06-06 ENCOUNTER — Ambulatory Visit
Admission: RE | Admit: 2023-06-06 | Discharge: 2023-06-06 | Disposition: A | Discharge status: Home / Self Care | Payer: 59 | Source: Ambulatory Visit | Attending: Radiation Oncology | Admitting: Radiation Oncology

## 2023-06-06 DIAGNOSIS — C679 Malignant neoplasm of bladder, unspecified: Secondary | ICD-10-CM | POA: Diagnosis not present

## 2023-06-06 LAB — RAD ONC ARIA SESSION SUMMARY
Course Elapsed Days: 28
Plan Fractions Treated to Date: 21
Plan Prescribed Dose Per Fraction: 1.8 Gy
Plan Total Fractions Prescribed: 25
Plan Total Prescribed Dose: 45 Gy
Reference Point Dosage Given to Date: 37.8 Gy
Reference Point Session Dosage Given: 1.0566 Gy
Session Number: 21

## 2023-06-07 ENCOUNTER — Other Ambulatory Visit: Payer: Self-pay

## 2023-06-07 ENCOUNTER — Inpatient Hospital Stay: Payer: 59

## 2023-06-07 ENCOUNTER — Ambulatory Visit
Admission: RE | Admit: 2023-06-07 | Discharge: 2023-06-07 | Disposition: A | Discharge status: Home / Self Care | Payer: 59 | Source: Ambulatory Visit | Attending: Radiation Oncology | Admitting: Radiation Oncology

## 2023-06-07 ENCOUNTER — Ambulatory Visit: Payer: 59

## 2023-06-07 DIAGNOSIS — C679 Malignant neoplasm of bladder, unspecified: Secondary | ICD-10-CM | POA: Diagnosis not present

## 2023-06-07 LAB — RAD ONC ARIA SESSION SUMMARY
Course Elapsed Days: 29
Plan Fractions Treated to Date: 22
Plan Prescribed Dose Per Fraction: 1.8 Gy
Plan Total Fractions Prescribed: 25
Plan Total Prescribed Dose: 45 Gy
Reference Point Dosage Given to Date: 39.6 Gy
Reference Point Session Dosage Given: 1.8 Gy
Session Number: 22

## 2023-06-08 ENCOUNTER — Ambulatory Visit: Payer: 59

## 2023-06-08 ENCOUNTER — Other Ambulatory Visit: Payer: Self-pay

## 2023-06-08 ENCOUNTER — Ambulatory Visit
Admission: RE | Admit: 2023-06-08 | Discharge: 2023-06-08 | Disposition: A | Discharge status: Home / Self Care | Payer: 59 | Source: Ambulatory Visit | Attending: Radiation Oncology

## 2023-06-08 ENCOUNTER — Inpatient Hospital Stay: Payer: 59

## 2023-06-08 DIAGNOSIS — C679 Malignant neoplasm of bladder, unspecified: Secondary | ICD-10-CM | POA: Diagnosis not present

## 2023-06-08 LAB — RAD ONC ARIA SESSION SUMMARY
Course Elapsed Days: 30
Plan Fractions Treated to Date: 23
Plan Prescribed Dose Per Fraction: 1.8 Gy
Plan Total Fractions Prescribed: 25
Plan Total Prescribed Dose: 45 Gy
Reference Point Dosage Given to Date: 41.4 Gy
Reference Point Session Dosage Given: 1.8 Gy
Session Number: 23

## 2023-06-09 ENCOUNTER — Ambulatory Visit: Payer: 59

## 2023-06-09 ENCOUNTER — Ambulatory Visit
Admission: RE | Admit: 2023-06-09 | Discharge: 2023-06-09 | Disposition: A | Discharge status: Home / Self Care | Payer: 59 | Source: Ambulatory Visit | Attending: Radiation Oncology | Admitting: Radiation Oncology

## 2023-06-09 ENCOUNTER — Other Ambulatory Visit: Payer: Self-pay

## 2023-06-09 ENCOUNTER — Inpatient Hospital Stay: Payer: 59

## 2023-06-09 DIAGNOSIS — C679 Malignant neoplasm of bladder, unspecified: Secondary | ICD-10-CM | POA: Diagnosis not present

## 2023-06-09 LAB — RAD ONC ARIA SESSION SUMMARY
Course Elapsed Days: 31
Plan Fractions Treated to Date: 24
Plan Prescribed Dose Per Fraction: 1.8 Gy
Plan Total Fractions Prescribed: 25
Plan Total Prescribed Dose: 45 Gy
Reference Point Dosage Given to Date: 43.2 Gy
Reference Point Session Dosage Given: 1.8 Gy
Session Number: 24

## 2023-06-10 ENCOUNTER — Other Ambulatory Visit: Payer: Self-pay

## 2023-06-10 ENCOUNTER — Ambulatory Visit
Admission: RE | Admit: 2023-06-10 | Discharge: 2023-06-10 | Disposition: A | Discharge status: Home / Self Care | Payer: 59 | Source: Ambulatory Visit | Attending: Radiation Oncology | Admitting: Radiation Oncology

## 2023-06-10 ENCOUNTER — Inpatient Hospital Stay: Payer: 59

## 2023-06-10 ENCOUNTER — Ambulatory Visit: Payer: 59

## 2023-06-10 DIAGNOSIS — C679 Malignant neoplasm of bladder, unspecified: Secondary | ICD-10-CM | POA: Diagnosis not present

## 2023-06-10 LAB — RAD ONC ARIA SESSION SUMMARY
Course Elapsed Days: 32
Plan Fractions Treated to Date: 25
Plan Prescribed Dose Per Fraction: 1.8 Gy
Plan Total Fractions Prescribed: 25
Plan Total Prescribed Dose: 45 Gy
Reference Point Dosage Given to Date: 45 Gy
Reference Point Session Dosage Given: 1.8 Gy
Session Number: 25

## 2023-06-13 ENCOUNTER — Ambulatory Visit: Admission: RE | Admit: 2023-06-13 | Payer: 59 | Source: Ambulatory Visit

## 2023-06-13 ENCOUNTER — Inpatient Hospital Stay: Payer: 59

## 2023-06-14 ENCOUNTER — Inpatient Hospital Stay: Payer: 59

## 2023-06-14 ENCOUNTER — Ambulatory Visit: Payer: 59

## 2023-06-15 ENCOUNTER — Inpatient Hospital Stay: Payer: 59

## 2023-06-15 ENCOUNTER — Ambulatory Visit

## 2023-06-15 ENCOUNTER — Ambulatory Visit: Payer: 59

## 2023-06-16 ENCOUNTER — Other Ambulatory Visit: Payer: 59

## 2023-06-16 ENCOUNTER — Ambulatory Visit: Payer: 59

## 2023-06-16 ENCOUNTER — Ambulatory Visit

## 2023-06-17 ENCOUNTER — Ambulatory Visit

## 2023-06-17 ENCOUNTER — Ambulatory Visit
Admission: RE | Admit: 2023-06-17 | Discharge: 2023-06-17 | Disposition: A | Discharge status: Home / Self Care | Source: Ambulatory Visit | Attending: Radiation Oncology | Admitting: Radiation Oncology

## 2023-06-17 DIAGNOSIS — C679 Malignant neoplasm of bladder, unspecified: Secondary | ICD-10-CM | POA: Insufficient documentation

## 2023-06-17 DIAGNOSIS — C775 Secondary and unspecified malignant neoplasm of intrapelvic lymph nodes: Secondary | ICD-10-CM | POA: Insufficient documentation

## 2023-06-20 ENCOUNTER — Ambulatory Visit

## 2023-06-21 ENCOUNTER — Ambulatory Visit

## 2023-06-21 ENCOUNTER — Telehealth: Payer: Self-pay

## 2023-06-21 NOTE — Telephone Encounter (Signed)
 RN called Todd [name] wanted to inform him that we were able to assist in getting him transportation to Cancer center to finish up his last 3 treatments.  Had to leave voicemail to return my call to discuss this option.

## 2023-06-22 ENCOUNTER — Telehealth: Payer: Self-pay

## 2023-06-22 ENCOUNTER — Ambulatory Visit

## 2023-06-22 NOTE — Telephone Encounter (Signed)
 [facility] CSW Progress Note  Clinical Child psychotherapist received message from patient's Rad Onc nurse stating patient is now in [place] due to familial conflict at [employer] residence. Patient informed nurse that he has limited funds to continue to pay for motel in [place] and expressed concerns about transportation to remaining Rad Onc treatments.   Transportation CSW shared with Rad Onc nurse ACS number for transportation assistance on 3/11. Rad Onc successfully called and arranged transportation. Reported she was unable to share with patient due to his reports of not feeling well.  CSW inquired with Transportation Coordinator if transportation would be available due to patient being outside of radius. Presenter, broadcasting informed CSW of case by case exceptions. However, Patient has missed several transportation rides and would need certainty that patient will utilize service.  Lodging CSW contacted ACS to inquire about any lodging assistance, patient does not meet eligibility. CSW is not aware of any other organizations who provide assistance for motels due to liability concerns.   CSW attempted to reach patient to discuss transportation and lodging further. No answer and unable to leave vm. At this time, CSW is uncertain if patient is attending scheduled Rad onc appointment for this date.  Marguerita Merles, LCSW Clinical Social Worker [facility]

## 2023-06-22 NOTE — Telephone Encounter (Signed)
 RN called patient to see if he was coming in for treatment no answer left voicemail to return call.

## 2023-06-22 NOTE — Telephone Encounter (Signed)
 RN called Todd [name] to follow up and try to get transportation assistance to get his last few treatment completed.  Todd [name] inform this Clinical research associate that he was currently residing at a motel and that he wasn't sure if he would be coming in for treatment today.  Worried about leaving his belongings.  During our conversation he reports not feeling well pain to abdomen at the site of his ostomy bag and having some dizziness.  This Clinical research associate advised Todd [name] to come in to be evaluated or go to [facility] ED.  RN reached out to [employer] to follow up with Todd [name] about any available financial resources.

## 2023-06-23 ENCOUNTER — Ambulatory Visit

## 2023-06-23 ENCOUNTER — Ambulatory Visit
Admission: RE | Admit: 2023-06-23 | Discharge: 2023-06-23 | Disposition: A | Discharge status: Home / Self Care | Source: Ambulatory Visit | Attending: Radiation Oncology | Admitting: Radiation Oncology

## 2023-06-23 DIAGNOSIS — C679 Malignant neoplasm of bladder, unspecified: Secondary | ICD-10-CM

## 2023-06-23 NOTE — Progress Notes (Signed)
 RN spoke with Lillia Pauls to inform her about Mr. Milligan missing appointments.  We have been advocating for Mr. Ruesch trying to assist him with needs as transportation was an issue to continue last few treatments and how we weren't able to get Mr. Gaxiola to return calls.  At this point Dr. Kathrynn Running is willing to hold treatments if she feels she is able to reach him if not we can revisit this at a later date.  Alvia Grove states her father no longer lives in her home that he reside in a hotel in [place] that she is no longer able to assist in his care.  [name] reports Mr. Gallina has a substance abuse problem and that is the main reason she no longer has contact.  She appreciates all that has been done for him but she has reached her limits.  RN advised if there is anything we can do please call back.

## 2023-06-24 ENCOUNTER — Ambulatory Visit

## 2023-06-27 ENCOUNTER — Ambulatory Visit

## 2023-06-28 ENCOUNTER — Ambulatory Visit

## 2023-06-29 ENCOUNTER — Ambulatory Visit

## 2023-06-30 ENCOUNTER — Ambulatory Visit

## 2023-07-04 NOTE — Radiation Completion Notes (Addendum)
  Radiation Oncology         [phone]) [phone] ________________________________  Name: Todd [name]. MRN: [id number]  Date: 06/23/2023  DOB: 1955-10-08  Referring Physician: Geanie Berlin, M.D. Date of Service: 2023-07-04 Radiation Oncologist: Margaretmary Bayley, M.D. 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) s/p radical cystoprostatectomy   Intent: Curative     ==========DELIVERED PLANS==========  First Treatment Date: 2023-05-09 Last Treatment Date: 2023-06-10   Plan Name: Bladder Site: Bladder Technique: IMRT Mode: Photon Dose Per Fraction: 1.8 Gy Prescribed Dose (Delivered / Prescribed): 45 Gy / 45 Gy Prescribed Fxs (Delivered / Prescribed): 25 / 25     ==========ON TREATMENT VISIT DATES========== 2023-05-13, 2023-05-20, 2023-05-30, 2023-06-03, 2023-06-10    See weekly On Treatment Notes in Epic for details in the Media tab (listed as Progress notes on the On Treatment Visit Dates listed above). He tolerated treatments well with modest fatigue and occasional loose stools. His treatment course was prolonged due to multiple cancelled appointments secondary to issues with transportation. Ultimately, he discontinued treatments prior to completing the last 3 fractions of treatment for the originally prescribed bladder boost.  The patient will receive a call in about one month from the radiation oncology department. He will continue follow up with his medical oncologist, Dr. Cherly Hensen, and/or his oncology team at [facility], as well.  ------------------------------------------------   Margaretmary Dys, MD [facility]  Radiation Oncology Direct Dial: [phone]  Fax: [phone] [email or web address]  Skype  LinkedIn

## 2023-07-12 NOTE — Progress Notes (Deleted)
 [facility] OFFICE PROGRESS NOTE  Patient Care Team: System, Provider Not In as PCP - General  Todd [name] is a 68 y.o.male with history of MLH1 Lynch syndrome, stage II colon cancer in June 2013, NMIBC [facility] for urothelial carcinoma.   Diagnosis: stage IIIA pT3N1cM0 high-grade urothelial carcinoma invades perivesical soft tissue. Residual cancer cells occupying greater than or equal to 50% of tumor bed or absence of regressive changes. All margins negative. 1 left pelvic lymph node positive positive for malignancy. Margins positive for CIS.   Treatment:   02/07/23 right radical cystoprostatectomy at [facility].  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 If persistently low, will start B12 injection Lynch syndrome MLH1 positive Lynch syndrome Recommend testing first degree family Daughter report having tested.  MLH1 gene mutation MLH1 Lynch syndrome Colon cancer: High-quality colonoscopyo at age 31-25 y or 2-5 y prior to the earliest CRC if it is diagnosed before age 81 yp and repeat every 1-2 y Gastric and small bowel cancer: EGD starting at age 35-40 y and repeat every 2-4 y, preferably performed in conjunction with colonoscopy. Urothelial cancer (renal pelvis, ureter, and/or bladder): Surveillance may be considered in selected individuals such as those with a family history of urothelial cancer. Surveillance options may include annual urinalysis starting at age 43-35 y. However, there is insufficient evidence to recommend a particular surveillance strategy. Prostate cancer: reasonable for patients with LS to consider beginning shared decision-making about prostate cancer screening at age 69 y and to  consider screening at annual intervals rather than every other year. Brain cancer: Patients should be educated regarding signs and symptoms of neurologic cancer and the importance of prompt reporting of abnormal symptoms to their physicians. Skin: Frequency of malignant and benign skin tumors such as sebaceous adenocarcinomas, sebaceous adenomas, and keratoacanthomas has been reported to be increased among patients with LS, but cumulative lifetime risk and median age of presentation are uncertain.  Consider skin exam every 1-2 y with a health care provider skilled in identifying LS-associated skin manifestations. Age to start surveillance is uncertain and can be individualized. Recommend genetic counseling and consideration of genetic testing for relatives   No orders of the defined types were placed in this encounter.    [name], MD  INTERVAL HISTORY: Patient returns for follow-up. Completed radiation on 2/28.   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  2016-2018 He then had multiple other TURBTs with intermittent adherence/tolerability to BCG     05/27/2014 Procedure   Repeat TURBT, path again with LgTa. Only completed 2/6 BCG treatments, then lost to follow-up.    06/09/2015 Procedure   Repeat TURBT with 3 papillary tumors, pathology with LgTa and CIS.    06/2015 Imaging   CT A/P with diffuse bladder wall thickening, no definitive evidence of metastatic disease    07/21/2015 Procedure   Repeat TURBT with path showing high grade dysplasia. Again scheduled for BCG but only completed 1-2 doses.    10/19/2016 Procedure   Repeat TURBT with low grade papillary urothelial carcinoma/CIS. Received 4/6 doses of BCG.  Lost to follow-up.    03/09/2019 Imaging   CT CAP with LUL 2cm GGO, minimal right urinary bladder wall  thickening of 4mm    01/22/2020 Imaging   CT A/P in the ED for N/V/D/weight loss. Bladder thickening more pronounced at 7mm.    06/25/20 Imaging   CT urogram for hematuria showed new right lateral masslike thickening of bladder wall, measure 8mm. No evidence metastatic disease.    08/13/2020 Procedure   Repeat TURBT with invasive high-grade urothelial carcinoma with nonfocal invasion into lamina propria (CIS present), second specimen with invasive high-grade urothelial carcinoma extensively involving lamina propria, with one fragment showing involvement of muscle bundles which are equivocal for muscularis mucosae or muscularis propria (CIS again present).    09/19/2020 Procedure   Repeat TURBT for re-resection with invasive high-grade urothelial carcinoma with non-focal lamina propria invasion and CIS, no involvement of muscularis propria    11/20/2020 - 09/17/2021 Chemotherapy   C1 - 8 Pembrolizumab    12/05/2022 Imaging   MRI pelvis wo contrast -Increased size of right posterolateral bladder wall mass, now measuring up to 3.2 cm, previously 1.2 cm, with extramural extension of the mass encasing the distal right ureter and resulting in upstream right ureteral dilation. The extravesicular extension of the bladder mass also is in close contact with the vas deferens near the seminal vesicles for which abutment or invasion cannot be excluded.     01/03/2023 Imaging   CT AP -Increasing right posterior lateral bladder wall thickening, concerning for disease progression. New right ureteral obstruction associated hydroureteronephrosis.  -Multiple subcentimeter hypoattenuating foci within the liver, favored to be benign given stability since 2016.   CT Chest: No intrathoracic metastasis with stable findings since December 28.     01/17/2023 PET scan   PET from [facility] - Thickening of the right posterolateral bladder wall, similar to prior CT dated 01/03/2023.  - Minimally FDG avid bilateral inguinal  lymph nodes which do not appear pathologically enlarged or abnormal in appearance. These are favored to be reactive.  -There is a possible mildly hypermetabolic nodule along the posterior wall of the rectum which is not completely characterized on this study. Suggest correlation with colonoscopy, which patient has scheduled 01/20/2023.    02/07/2023 Pathology Results   A: Ureter margin, right distal, biopsy - Benign ureter with chronic inflammation - Negative for malignancy   B: Ureter margin, left distal, biopsy - Focal atypia, worrisome for focal involvement by urothelial carcinoma in situ   C: Bladder and prostate, cystoprostatectomy   BLADDER - Multifocal invasive high grade papillary urothelial carcinoma              - Dominant tumor is in right posterior upper bladder wall, 3.0 cm, invasive through muscularis propria with macroscopic invasion of peri-vesicle adipose tissue              - Satellite tumor in left bladder extending into left ureter, 0.9 cm, invasive into lamina propria - Extensive multifocal high grade papillary urothelial carcinoma and urothelial carcinoma in situ present in all random sections of the bladder mucosa (anterior, posterior, dome, trigone, right lateral, left lateral walls) away from invasive cancers - 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 (contiguous with carcinoma in situ of trigone), distal margin uninvolved - Margins of resection negative for invasive carcinoma - Left ureter margin of this specimen shows urothelial carcinoma in situ, superceded by additional specimens, final left ureter margin is in specimen F, which shows patchy atypia concerning for UCIS at  margin - See synoptic report    PROSTATE - 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 and involves prostatic ducts - Prostatic stroma with no evidence of involvement by urothelial carcinoma, negative for prostate carcinoma - Prostatic urethral distal margin negative    D: Lymph node, right pelvic, biopsy - Five lymph nodes with no evidence of malignancy (0/5)    E: Lymph node, left pelvic, biopsy - Metastatic carcinoma present in one of five lymph nodes (1/5), size of metastatic deposit 1.4 mm, without extranodal extension   F: True ureteral margin, left distal, biopsy - 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 - Negative for invasive carcinoma   G: True ureteral margin, right distal, biopsy - Benign ureter with chronic inflammation - Negative for malignancy  SPECIMEN    Procedure:    Radical cystoprostatectomy   TUMOR    Tumor Site:    Right lateral wall     Tumor Site:    Left lateral wall     Tumor Site:    Posterior wall     Histologic Type:    Urothelial carcinoma, invasive (conventional)     Histologic Grade:    High-grade     Tumor Size:    Greatest Dimension (Centimeters): 3.0 cm       Additional Dimension (Centimeters):    2.9 cm       Additional Dimension (Centimeters):    2.8 cm     Tumor Extent:    Invades perivesical soft tissue microscopically     Lymphatic and / or Vascular Invasion:    Not identified     Tumor Configuration:    Papillary     Tumor Configuration:    Solid / nodule     Tumor Configuration:    Flat     Treatment Effect Post Neoadjuvant Chemotherapy:    Weak and no response: residual cancer cells occupying greater than or equal to 50% of the tumor bed or absence of regressive changes (TRG3)     Tumor Comment:    Appears to have received 1 dose of pembrolizumab, no apparent histologic response.   MARGINS    Margin Status for Invasive Tumor:    All margins negative for invasive tumor       Closest Margin(s) to Invasive tumor:    Soft tissue: right peri-vesicular margin       Distance from Invasive Tumor to Closest Margin:    13 mm     Margin Status for Carcinoma in Situ / Noninvasive Papillary Urothelial Carcinoma:    Carcinoma in situ / noninvasive papillary  urothelial carcinoma present at margin       Margin(s) Involved by Carcinoma in Situ / Noninvasive Papillary Urothelial Carcinoma:    Left ureteral   REGIONAL LYMPH NODES     Regional Lymph Node Status:          :    Tumor present in regional lymph node(s)         Number of Lymph Nodes with Tumor:    1         Size of Largest Nodal Metastatic Deposit:    0.14 cm         Nodal Site with Largest Metastatic Deposit:    Left pelvic         Size of Largest Lymph Node with Tumor:    0.7 cm         Largest Lymph Node with Tumor:  left pelvic         Extranodal Extension (ENE):    Not identified       Number of Lymph Nodes Examined:    10   pTNM CLASSIFICATION (AJCC 8th Edition)     Reporting of pT, pN, and (when applicable) pM categories is based on information available to the pathologist at the time the report is issued. As per the AJCC (Chapter 1, 8th Ed.) it is the managing physician's responsibility to establish the final pathologic stage based upon all pertinent information, including but potentially not limited to this pathology report.     Modified Classification:    y     pT Category:    pT3a     T Suffix:    (m)     pN Category:    pN1    02/20/2023 Procedure   Colonoscopy Impression:    - The examined portion of the ileum was normal.                         - The entire examined colon is normal on direct and retroflexion views.                         - Views were limited by liquid stool throughout the colon and difficulty suctioning the stool.                         - No specimens collected.    02/23/2023 Imaging   CT AP Hepatobiliary: Hypodensities in liver consistent benign cysts. Postcholecystectomy. Mild extrahepatic biliary duct dilatation not changed from prior.   04/06/2023 Imaging   CT AP from [facility] Relative hypoenhancement of the right kidney when compared to the left which is nonspecific. This could be related to altered perfusion or infection/pyelonephritis. No  significant hydronephrosis is identified. Clinical correlation is recommended.  LIVER: Normal liver contour. Subcentimeter hypoattenuating hepatic foci too small to characterize further on CT.    05/09/2023 - 06/10/2023 Radiation Therapy   Adjuvant IMRT to bladder 45 Gy/ 25 fxs   05/10/2023 Cancer Staging   Staging form: Urinary Bladder, AJCC 8th Edition - Clinical: Stage IIIA (cT3, cN1, cM0) - Signed by [name], MD on 05/10/2023 WHO/ISUP grade (low/high): High Grade Histologic grading system: 2 grade system   05/19/2023 Imaging   Presented to ED with abdominal pain CT AP: IMPRESSION: 1. Surgical resection of the bladder and prostate with ileal conduit present in the right lower quadrant. 2. No evidence of metastatic disease. 3. Mild delay of nephrogram on the right without hydronephrosis, possibly renovascular disease. 4. Aortic atherosclerosis.  CT chest: no metastases   05/31/2023 - 05/31/2023 Chemotherapy   Patient is on Treatment Plan : BLADDER Nivolumab (240) q14d      Past Medical History:  Diagnosis Date   Allergy    Anxiety    Arthritis    Asthma    Bladder cancer (HCC) dx'd 10/2013   recurrent   Chronic gastritis    Chronic low back pain    Colon cancer (HCC) dx'd 09/2011   COPD (chronic obstructive pulmonary disease) (HCC)    Depression    Diverticulosis    Dysuria    GERD (gastroesophageal reflux disease)    History of acute pancreatitis    due to SIRS   History of adenomatous polyp of colon    04-04-2015  last colonoscopy w/ multiple  polyps--  tubular adenomas, beign polypoid colonic mucous   History of bladder cancer monitory by  dr Vernie Ammons   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   History of colon cancer oncologist-- dr Truett Perna--  dx  with Hereditary non-polyposis colon cancer syndrome,  MLH1 gene mutation--     dx june 2013--  stage II, T3,N0--  s/p  transverse colectomy--  completed 2 cycles  chemotherapy--  NO RECURRENCE in clinical  remission   Hypertension    Lactose intolerance    Lower urinary tract symptoms (LUTS)    Lynch syndrome    MLH1 gene mutation    Short of breath on exertion    Wears glasses      PHYSICAL EXAMINATION: ECOG PERFORMANCE STATUS: {CHL ONC ECOG PS:[phone]}  There were no vitals filed for this visit. There were no vitals filed for this visit.  Relevant data reviewed during this visit included ***

## 2023-07-12 NOTE — Assessment & Plan Note (Deleted)
 MLH1 positive Lynch syndrome Recommend testing first degree family Daughter report having tested.

## 2023-07-12 NOTE — Assessment & Plan Note (Deleted)
 MLH1 Lynch syndrome Colon cancer: High-quality colonoscopyo at age 68-25 y or 2-5 y prior to the earliest CRC if it is diagnosed before age 64 yp and repeat every 1-2 y Gastric and small bowel cancer: EGD starting at age 81-40 y and repeat every 2-4 y, preferably performed in conjunction with colonoscopy. Urothelial cancer (renal pelvis, ureter, and/or bladder): Surveillance may be considered in selected individuals such as those with a family history of urothelial cancer. Surveillance options may include annual urinalysis starting at age 14-35 y. However, there is insufficient evidence to recommend a particular surveillance strategy. Prostate cancer: reasonable for patients with LS to consider beginning shared decision-making about prostate cancer screening at age 61 y and to consider screening at annual intervals rather than every other year. Brain cancer: Patients should be educated regarding signs and symptoms of neurologic cancer and the importance of prompt reporting of abnormal symptoms to their physicians. Skin: Frequency of malignant and benign skin tumors such as sebaceous adenocarcinomas, sebaceous adenomas, and keratoacanthomas has been reported to be increased among patients with LS, but cumulative lifetime risk and median age of presentation are uncertain.  Consider skin exam every 1-2 y with a health care provider skilled in identifying LS-associated skin manifestations. Age to start surveillance is uncertain and can be individualized. Recommend genetic counseling and consideration of genetic testing for relatives

## 2023-07-12 NOTE — Assessment & Plan Note (Deleted)
 History of ileal conduit Has been B12 over few months. If persistently low, will start B12 injection

## 2023-07-12 NOTE — Assessment & Plan Note (Deleted)
 Repeat CT surveillance in end of May Follow up in about 1 week after CT with me

## 2023-07-14 ENCOUNTER — Inpatient Hospital Stay: Payer: 59

## 2023-07-14 DIAGNOSIS — Z1509 Genetic susceptibility to other malignant neoplasm: Secondary | ICD-10-CM

## 2023-07-14 DIAGNOSIS — E538 Deficiency of other specified B group vitamins: Secondary | ICD-10-CM

## 2023-07-14 DIAGNOSIS — Z1589 Genetic susceptibility to other disease: Secondary | ICD-10-CM

## 2023-07-14 DIAGNOSIS — C775 Secondary and unspecified malignant neoplasm of intrapelvic lymph nodes: Secondary | ICD-10-CM

## 2023-07-19 ENCOUNTER — Emergency Department (HOSPITAL_COMMUNITY)
Admission: EM | Admit: 2023-07-19 | Discharge: 2023-07-19 | Disposition: A | Discharge status: Skilled Nursing Facility | Attending: Emergency Medicine | Admitting: Emergency Medicine

## 2023-07-19 ENCOUNTER — Other Ambulatory Visit: Payer: Self-pay

## 2023-07-19 DIAGNOSIS — Z8551 Personal history of malignant neoplasm of bladder: Secondary | ICD-10-CM | POA: Diagnosis not present

## 2023-07-19 DIAGNOSIS — I10 Essential (primary) hypertension: Secondary | ICD-10-CM | POA: Insufficient documentation

## 2023-07-19 DIAGNOSIS — Z85038 Personal history of other malignant neoplasm of large intestine: Secondary | ICD-10-CM | POA: Insufficient documentation

## 2023-07-19 DIAGNOSIS — J449 Chronic obstructive pulmonary disease, unspecified: Secondary | ICD-10-CM | POA: Insufficient documentation

## 2023-07-19 DIAGNOSIS — M545 Low back pain, unspecified: Secondary | ICD-10-CM | POA: Diagnosis present

## 2023-07-19 DIAGNOSIS — G8929 Other chronic pain: Secondary | ICD-10-CM | POA: Diagnosis not present

## 2023-07-19 MED ORDER — MORPHINE SULFATE ER 15 MG PO TBCR
30.0000 mg | EXTENDED_RELEASE_TABLET | Freq: Once | ORAL | Status: AC
  Administered 2023-07-19: 30 mg via ORAL
  Filled 2023-07-19: qty 2

## 2023-07-19 NOTE — ED Notes (Signed)
 Pt left facility with PTAR to go back to [facility]

## 2023-07-19 NOTE — Discharge Instructions (Addendum)
 Your symptom is likely due to chronic pain.  I have reached out to your facility to ensure that your pain medications are available.  Please resume your medication once return back to your facility.  Follow-up with your doctor for further care.

## 2023-07-19 NOTE — ED Provider Notes (Signed)
 [place] EMERGENCY DEPARTMENT AT [facility] Provider Note   CSN: [id number] Arrival date & time: 07/19/23  8295     History  Chief Complaint  Patient presents with   Foot Pain   Hip Pain    Todd [name]. is a 68 y.o. male.  The history is provided by the patient, medical records and the EMS personnel. No language interpreter was used.  Foot Pain  Hip Pain     68 year old male significant history of chronic low back pain, colon cancer, bladder cancer, COPD, chronic gastritis, anxiety, hypertension brought here via EMS from [facility] complaining of pain.  Patient states he takes chronic pain medication for many years.  Last night he was requesting for pain medication but he reportedly states that the staff mentioned did not have it available.  He then called EMS to bring him to the ER.  Prior to going to the ER, he admits the staff did give him his prescribed oxycodone but did not give him another of his pain medication.  He is not complaining of pain to his back and hips which he attributes to his chronic pain.  No other complaint.  Home Medications Prior to Admission medications   Medication Sig Start Date End Date Taking? Authorizing Provider  albuterol (PROVENTIL HFA;VENTOLIN HFA) 108 (90 BASE) MCG/ACT inhaler Inhale 1 puff into the lungs every 6 (six) hours as needed for wheezing or shortness of breath.    [provider]  budesonide-formoterol (SYMBICORT) 160-4.5 MCG/ACT inhaler Inhale 2 puffs into the lungs 2 (two) times daily as needed (for respiratory flares).    [provider]  cephALEXin (KEFLEX) 500 MG capsule Take 1 capsule (500 mg total) by mouth 4 (four) times daily. 05/20/23   Cardama, Camila Cecil, MD  clonazePAM (KLONOPIN) 1 MG tablet Take 1 tablet (1 mg total) by mouth 3 (three) times daily as needed for anxiety. 06/03/23   Kenith Payer, MD  gabapentin (NEURONTIN) 100 MG capsule Take 100 mg by mouth 3 (three) times  daily.    [provider]  Ibuprofen (ADVIL) 200 MG CAPS Take 200-800 mg by mouth every 6 (six) hours as needed (for pain or headaches).    [provider]  loperamide (IMODIUM) 2 MG capsule Take 1 capsule (2 mg total) by mouth as needed for diarrhea or loose stools. 06/01/23   Bruning, Ashlyn, PA-C  Na Sulfate-K Sulfate-Mg Sulf 17.5-3.13-1.6 GM/177ML SOLN Suprep (no substitutions)-TAKE AS DIRECTED. 12/27/19   Armbruster, Lendon Queen, MD  pantoprazole (PROTONIX) 40 MG tablet Take 1 tablet (40 mg total) by mouth 2 (two) times daily. Patient taking differently: Take 40 mg by mouth daily as needed (for reflux). 05/21/19   Armbruster, Lendon Queen, MD  valsartan-hydrochlorothiazide (DIOVAN-HCT) 160-12.5 MG tablet Take 1 tablet by mouth daily.    [provider]  vitamin B-12 (CYANOCOBALAMIN) 500 MCG tablet Take 500 mcg by mouth daily.    [provider]      Allergies    Clindamycin/lincomycin, Milk-related compounds, and Hydrocodone    Review of Systems   Review of Systems  All other systems reviewed and are negative.   Physical Exam Updated Vital Signs BP (!) 158/82 (BP Location: Left Arm)   Pulse 91   Temp 98.7 F (37.1 C) (Oral)   Resp 18   Ht 5\' 10"  (1.778 m)   Wt 81.6 kg   SpO2 98%   BMI 25.83 kg/m  Physical Exam Constitutional:  General: He is not in acute distress.    Appearance: He is well-developed.  HENT:     Head: Atraumatic.  Eyes:     Conjunctiva/sclera: Conjunctivae normal.  Abdominal:     Comments: Colostomy bag in place without signs of infection.  Musculoskeletal:     Cervical back: Normal range of motion and neck supple.     Comments: Able to range both hips without difficulty.  Skin:    Findings: No rash.  Neurological:     Mental Status: He is alert.     ED Results / Procedures / Treatments   Labs (all labs ordered are listed, but only abnormal results are displayed) Labs Reviewed - No data to  display  EKG None  Radiology No results found.  Procedures Procedures    Medications Ordered in ED Medications - No data to display  ED Course/ Medical Decision Making/ A&P                                 Medical Decision Making Risk Prescription drug management.   BP (!) 158/82 (BP Location: Left Arm)   Pulse 91   Temp 98.7 F (37.1 C) (Oral)   Resp 18   Ht 5\' 10"  (1.778 m)   Wt 81.6 kg   SpO2 98%   BMI 25.83 kg/m   90:46 AM  68 year old male significant history of chronic low back pain, colon cancer, bladder cancer, COPD, chronic gastritis, anxiety, hypertension brought here via EMS from [facility] complaining of pain.  Patient states he takes chronic pain medication for many years.  Last night he was requesting for pain medication but he reportedly states that the staff mentioned did not have it available.  He then called EMS to bring him to the ER.  Prior to going to the ER, he admits the staff did give him his prescribed oxycodone but did not give him another of his pain medication.  He is not complaining of pain to his back and hips which he attributes to his chronic pain.  No other complaint.  On exam patient is resting comfortably in bed appears to be in no acute discomfort.  He is able to sit up from the bed without difficulty.  He has an ostomy bag on his abdomen that is functioning appropriately.  No hypoxia no fever.  Vital signs with blood pressure of 150/82.  Patient here is acute on chronic pain relating to not receiving his morphine 30 mg ER as scheduled and states he hasn't received it in the past 3 days.  He did reportedly receive oxycodone 15 mg prior to arrival which is approximately an hour ago.  He does not exhibit any symptoms concerning for acute fracture or dislocation or infectious symptoms.  I have attempted to reach out to [facility] for nursing and rehabitation but I am unable to speak to any available staff at this  time.  8:46 AM I was able to reach out to patient's facility to verify access to patient's pain medication including morphine extended release.  He does have medication available and he certainly can resume his medication once he returns to the facility.  At this time patient is stable for discharge.  No additional workup indicated.  Patient made aware of plan.        Final Clinical Impression(s) / ED Diagnoses Final diagnoses:  Chronic bilateral low back pain without sciatica  Rx / DC Orders ED Discharge Orders     None         Debbra Fairy, PA-C 07/19/23 1610    Roberts Ching, MD 07/23/23 [phone]

## 2023-07-19 NOTE — ED Triage Notes (Signed)
 PT BIB  EMS coming from [facility]. Pt c/o of chronic pain,states not receiving his pain medication for past 3 days. Daily on oxycodone and another pain med. Hx. Of back surgeries. Denies any chest pain. Having flu like symptoms, states he thinks he is withdrawing since not getting his daily meds.

## 2023-07-19 NOTE — ED Notes (Signed)
 [facility] called, ready for transport

## 2023-07-21 NOTE — Addendum Note (Signed)
 Encounter addended by: Marcello Fennel, PA-C on: 07/21/2023 10:00 AM  Actions taken: Clinical Note Signed, Visit diagnoses modified

## 2023-07-22 NOTE — Progress Notes (Addendum)
  Radiation Oncology         [phone]) [phone] ________________________________  Name: Todd [name]. 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   Symptoms of fatigue have improved since completing therapy.  Bladder: Removed  Symptoms of bowel changes have improved since completing therapy. Current symptoms include diarrhea, and medications for bowel symptoms include Imodium.   Post Treatment IPSS Score: Current Cath/Bag.  Patient will continue follow up with his medical oncologist, Dr. Cherly Hensen, and/or his oncology team at [facility], as well.  He was encouraged to call back with concerns or questions regarding radiation.   This concludes the interaction.  Ruel Favors, LPN

## 2023-07-26 ENCOUNTER — Telehealth: Payer: Self-pay

## 2023-07-26 ENCOUNTER — Ambulatory Visit
Admission: RE | Admit: 2023-07-26 | Discharge: 2023-07-26 | Disposition: A | Discharge status: Home / Self Care | Source: Ambulatory Visit

## 2023-07-26 DIAGNOSIS — C679 Malignant neoplasm of bladder, unspecified: Secondary | ICD-10-CM | POA: Insufficient documentation

## 2023-07-26 DIAGNOSIS — C775 Secondary and unspecified malignant neoplasm of intrapelvic lymph nodes: Secondary | ICD-10-CM | POA: Insufficient documentation

## 2023-07-26 NOTE — Telephone Encounter (Signed)
Mailbox is full and unable to leave VM

## 2023-07-26 NOTE — Telephone Encounter (Signed)
 TC from pt, stating that someone from Dr. Arletta Bender office told him they would call him back last week, but he hasn't heard from anyone. 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 Advised that he needs to contact his surgeon's office, but he says he cannot remember who did the surgery. Offered to search his [facility] records to find the surgeon's name, but he declines. Pt states he shouldn't be the one to call, that a lawyer should contact them. He abruptly ends the phone call, stating he will call back if he needs anything else.

## 2023-07-26 NOTE — Telephone Encounter (Signed)
 Todd [name] had come clarification questions about his appointment. He also had some clinical questions and I was able to transfer him to the nurse line.

## 2023-08-15 ENCOUNTER — Inpatient Hospital Stay

## 2023-08-15 DIAGNOSIS — C775 Secondary and unspecified malignant neoplasm of intrapelvic lymph nodes: Secondary | ICD-10-CM

## 2023-08-15 DIAGNOSIS — C679 Malignant neoplasm of bladder, unspecified: Secondary | ICD-10-CM

## 2023-08-15 DIAGNOSIS — C674 Malignant neoplasm of posterior wall of bladder: Secondary | ICD-10-CM | POA: Diagnosis not present

## 2023-08-15 NOTE — Progress Notes (Signed)
 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   Patient location: in [facility]. Physician location: in [facility] cancer center Visit type: telephone call  Previous history reviewed.  Patient has longstanding history of non-muscle invasive bladder cancer underwent multiple TURBTs since 2015.  He has received BCG, and pembrolizumab in the setting.  Last pembrolizumab was in 09/2021.  PET/CT in October 2024 showed thickening of the right posterolateral bladder wall.  Due to findings of increasing right posterior lateral bladder wall thickening and hydronephrosis suspect higher stage disease patient underwent right radical cystoprostatectomy on 02/07/23 at [facility].  Final pathology showed pT3N1 high-grade urothelial carcinoma invades perivesical soft tissue.  Residual cancer cells occupying greater than or equal to 50% of tumor bed or absence of regressive changes.  All margins negative.  1 left pelvic lymph node positive positive for malignancy.  Margins positive for CIS.  Initially we were planning on chemoradiation but due to his declined PS, hospitalization at [facility] with chest pain, elevated troponin, followed by kidney infection he underwent adjuvant radiation without chemotherapy.  Previous CT abdomen and pelvis from [facility] did not show metastases.  Report of subcentimeter hepatic foci too small to characterize.  His previous imaging has reported multiple hepatic cysts in the past.  I recommend continued monitoring.  He completed adjuvant radiation (1/27-2/28/25).  Given his borderline renal insufficiency, borderline performance status, no further treatment was recommended at the time. Today over the phone he expressed improvement of PS and overall feeling better. He said is is about 45  minutes from here and about 1/2 way before [facility] and us . I asked if he would like to follow up with [facility] vs here given the same distance. He would like to follow up here at [facility]: [name] instead of [facility]. Therefore we will obtain follow up CT here.  There are potential risks and benefit, potential significant side effects of immunotherapy. I cannot evaluate his PS over the phone adequately.  We can reassess his functional status after completion of CT in a few week and follow up in the clinic.  Diagnosis: pT3N1cM0 stage IIIA HG UC. 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 Return after CT in early June  Return in about early March, earlier if any concerns.  Total time spent 30 minutes.  The patient understands the plans discussed today and is in agreement with them.  He knows to contact our office if he develops concerns prior to his next appointment.  [name], MD   CANCER CENTER [facility] - A DEPT OF [facility] [address] [place] [address] Dept: [phone] Dept Fax: [phone]   Orders Placed This Encounter  Procedures   CT CHEST ABDOMEN PELVIS W CONTRAST    Standing Status:   Future    Expected Date:   09/09/2023    Expiration Date:   08/14/2024    If indicated  for the ordered procedure, I authorize the administration of contrast media per Radiology protocol:   Yes    Does the patient have a contrast media/X-ray dye allergy?:   Yes    Preferred imaging location?:   [facility]    If indicated for the ordered procedure, I authorize the  administration of oral contrast media per Radiology protocol:   Yes   CBC with Differential (Cancer Center Only)    Standing Status:   Future    Expiration Date:   08/14/2024   CMP (Cancer Center only)    Standing Status:   Future    Expiration Date:   08/14/2024      CHIEF COMPLAINT:  CC: Bladder cancer  Current Treatment: Adjuvant radiation  INTERVAL HISTORY:  [name] is being follow up post-radiation for telephone visit.  Report he is feeling better. Less fatigue. Walking almost daily. Appetite is improved. No other new  symptoms. He is living with a friend about 45 minutes from here.  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  2016-2018 He then had multiple other TURBTs with intermittent adherence/tolerability to BCG     05/27/2014 Procedure   Repeat TURBT, path again with LgTa. Only completed 2/6 BCG treatments, then lost to follow-up.    06/09/2015 Procedure   Repeat TURBT with 3 papillary tumors, pathology with LgTa and CIS.    06/2015 Imaging   CT A/P with diffuse bladder wall thickening, no definitive evidence of metastatic disease    07/21/2015 Procedure   Repeat TURBT with path showing high grade dysplasia. Again scheduled for BCG but only completed 1-2 doses.    10/19/2016 Procedure   Repeat TURBT with low grade papillary urothelial carcinoma/CIS. Received 4/6 doses of BCG. Lost to follow-up.    03/09/2019 Imaging   CT CAP with LUL 2cm GGO, minimal right urinary bladder wall thickening of 4mm    01/22/2020 Imaging   CT A/P in the ED for N/V/D/weight loss. Bladder thickening more pronounced at 7mm.    06/28/20 Imaging   CT urogram for hematuria showed new right lateral masslike thickening of bladder wall, measure 8mm. No evidence metastatic disease.    08/13/2020 Procedure    Repeat TURBT with invasive high-grade urothelial carcinoma with nonfocal invasion into lamina propria (CIS present), second specimen with invasive high-grade urothelial carcinoma extensively involving lamina propria, with one fragment showing involvement of muscle bundles which are equivocal for muscularis mucosae or muscularis propria (CIS again present).    09/19/2020 Procedure   Repeat TURBT for re-resection with invasive high-grade urothelial carcinoma with non-focal lamina propria invasion and CIS, no involvement of muscularis propria    11/20/2020 - 09/17/2021 Chemotherapy   C1 - 8 Pembrolizumab    12/05/2022 Imaging   MRI pelvis wo contrast -Increased size of right posterolateral bladder wall mass, now measuring up to 3.2 cm, previously 1.2 cm, with extramural extension of the mass encasing the distal right ureter and resulting in upstream right ureteral dilation. The extravesicular extension of the bladder mass also is in close contact with the vas deferens near the seminal vesicles for which abutment or invasion cannot be excluded.     01/03/2023 Imaging   CT AP -Increasing right posterior lateral bladder wall thickening, concerning for disease progression. New right ureteral obstruction associated hydroureteronephrosis.  -Multiple subcentimeter hypoattenuating  foci within the liver, favored to be benign given stability since 2016.   CT Chest: No intrathoracic metastasis with stable findings since December 28.     01/17/2023 PET scan   PET from [facility] - Thickening of the right posterolateral bladder wall, similar to prior CT dated 01/03/2023.  - Minimally FDG avid bilateral inguinal lymph nodes which do not appear pathologically enlarged or abnormal in appearance. These are favored to be reactive.  -There is a possible mildly hypermetabolic nodule along the posterior wall of the rectum which is not completely characterized on this study. Suggest correlation with colonoscopy, which patient  has scheduled 01/20/2023.    02/07/2023 Pathology Results   A: Ureter margin, right distal, biopsy - Benign ureter with chronic inflammation - Negative for malignancy   B: Ureter margin, left distal, biopsy - Focal atypia, worrisome for focal involvement by urothelial carcinoma in situ   C: Bladder and prostate, cystoprostatectomy   BLADDER - Multifocal invasive high grade papillary urothelial carcinoma              - Dominant tumor is in right posterior upper bladder wall, 3.0 cm, invasive through muscularis propria with macroscopic invasion of peri-vesicle adipose tissue              - Satellite tumor in left bladder extending into left ureter, 0.9 cm, invasive into lamina propria - Extensive multifocal high grade papillary urothelial carcinoma and urothelial carcinoma in situ present in all random sections of the bladder mucosa (anterior, posterior, dome, trigone, right lateral, left lateral walls) away from invasive cancers - 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 (contiguous with carcinoma in situ of trigone), distal margin uninvolved - Margins of resection negative for invasive carcinoma - Left ureter margin of this specimen shows urothelial carcinoma in situ, superceded by additional specimens, final left ureter margin is in specimen F, which shows patchy atypia 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 - See synoptic report    PROSTATE - 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 and involves prostatic ducts - Prostatic stroma with no evidence of involvement by urothelial carcinoma, negative for prostate carcinoma - Prostatic urethral distal margin negative   D: Lymph node, right pelvic, biopsy - Five lymph nodes with no evidence of malignancy (0/5)    E: Lymph node, left pelvic, biopsy - Metastatic carcinoma present in one of five lymph nodes (1/5), size of metastatic deposit 1.4 mm, without extranodal extension   F: True ureteral margin, left distal,  biopsy - 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 - Negative for invasive carcinoma   G: True ureteral margin, right distal, biopsy - Benign ureter with chronic inflammation - Negative for malignancy  SPECIMEN    Procedure:    Radical cystoprostatectomy   TUMOR    Tumor Site:    Right lateral wall     Tumor Site:    Left lateral wall     Tumor Site:    Posterior wall     Histologic Type:    Urothelial carcinoma, invasive (conventional)     Histologic Grade:    High-grade     Tumor Size:    Greatest Dimension (Centimeters): 3.0 cm       Additional Dimension (Centimeters):    2.9 cm       Additional Dimension (Centimeters):    2.8 cm     Tumor Extent:    Invades perivesical soft tissue microscopically     Lymphatic and /  or Vascular Invasion:    Not identified     Tumor Configuration:    Papillary     Tumor Configuration:    Solid / nodule     Tumor Configuration:    Flat     Treatment Effect Post Neoadjuvant Chemotherapy:    Weak and no response: residual cancer cells occupying greater than or equal to 50% of the tumor bed or absence of regressive changes (TRG3)     Tumor Comment:    Appears to have received 1 dose of pembrolizumab, no apparent histologic response.   MARGINS    Margin Status for Invasive Tumor:    All margins negative for invasive tumor       Closest Margin(s) to Invasive tumor:    Soft tissue: right peri-vesicular margin       Distance from Invasive Tumor to Closest Margin:    13 mm     Margin Status for Carcinoma in Situ / Noninvasive Papillary Urothelial Carcinoma:    Carcinoma in situ / noninvasive papillary urothelial carcinoma present at margin       Margin(s) Involved by Carcinoma in Situ / Noninvasive Papillary Urothelial Carcinoma:    Left ureteral   REGIONAL LYMPH NODES     Regional Lymph Node Status:          :    Tumor present in regional lymph node(s)         Number of Lymph Nodes with Tumor:    1          Size of Largest Nodal Metastatic Deposit:    0.14 cm         Nodal Site with Largest Metastatic Deposit:    Left pelvic         Size of Largest Lymph Node with Tumor:    0.7 cm         Largest Lymph Node with Tumor:    left pelvic         Extranodal Extension (ENE):    Not identified       Number of Lymph Nodes Examined:    10   pTNM CLASSIFICATION (AJCC 8th Edition)     Reporting of pT, pN, and (when applicable) pM categories is based on information available to the pathologist at the time the report is issued. As per the AJCC (Chapter 1, 8th Ed.) it is the managing physician's responsibility to establish the final pathologic stage based upon all pertinent information, including but potentially not limited to this pathology report.     Modified Classification:    y     pT Category:    pT3a     T Suffix:    (m)     pN Category:    pN1    02/20/2023 Procedure   Colonoscopy Impression:    - The examined portion of the ileum was normal.                         - The entire examined colon is normal on direct and retroflexion views.                         - Views were limited by liquid stool throughout the colon and difficulty suctioning the stool.                         - No specimens collected.    02/23/2023  Imaging   CT AP Hepatobiliary: Hypodensities in liver consistent benign cysts. Postcholecystectomy. Mild extrahepatic biliary duct dilatation not changed from prior.   03/03/2023 -  Chemotherapy   First visit to [facility]. Initially planned for chemoradiation. Due to decrease PS and recurrent hospitalization, did not pursue chemotherapy. Discussed pending PS to consider adjuvant nivolumab after radiation.     04/06/2023 Imaging   CT AP from [facility] Relative hypoenhancement of the right kidney when compared to the left which is nonspecific. This could be related to altered perfusion or infection/pyelonephritis. No significant hydronephrosis is identified. Clinical  correlation is recommended.  LIVER: Normal liver contour. Subcentimeter hypoattenuating hepatic foci too small to characterize further on CT.    05/09/2023 - 06/10/2023 Radiation Therapy   Adjuvant IMRT to bladder 45 Gy/ 25 fxs   05/10/2023 Cancer Staging   Staging form: Urinary Bladder, AJCC 8th Edition - Clinical: Stage IIIA (cT3, cN1, cM0) - Signed by [name], MD on 05/10/2023 WHO/ISUP grade (low/high): High Grade Histologic grading system: 2 grade system   05/19/2023 Imaging   Presented to ED with abdominal pain CT AP: IMPRESSION: 1. Surgical resection of the bladder and prostate with ileal conduit present in the right lower quadrant. 2. No evidence of metastatic disease. 3. Mild delay of nephrogram on the right without hydronephrosis, possibly renovascular disease. 4. Aortic atherosclerosis.  CT chest: no metastases      Past Medical History:  Diagnosis Date   Allergy    Anxiety    Arthritis    Asthma    Bladder cancer (HCC) dx'd 10/2013   recurrent   Chronic gastritis    Chronic low back pain    Colon cancer (HCC) dx'd 09/2011   COPD (chronic obstructive pulmonary disease) (HCC)    Depression    Diverticulosis    Dysuria    GERD (gastroesophageal reflux disease)    History of acute pancreatitis    due to SIRS   History of adenomatous polyp of colon    04-04-2015  last colonoscopy w/ multiple polyps--  tubular adenomas, beign polypoid colonic mucous   History of bladder cancer monitory by  dr Grady Lawman   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   History of colon cancer oncologist-- dr Scherrie Curt--  dx  with Hereditary non-polyposis colon cancer syndrome,  MLH1 gene mutation--     dx june 2013--  stage II, T3,N0--  s/p  transverse colectomy--  completed 2 cycles  chemotherapy--  NO RECURRENCE in clinical remission   Hypertension    Lactose intolerance    Lower urinary tract symptoms (LUTS)    Lynch syndrome    MLH1 gene mutation    Short of breath on  exertion    Wears glasses     REVIEW OF SYSTEMS:   All relevant systems were reviewed with the patient and are negative.   LABORATORY DATA:  I have reviewed the data as listed    Component Value Date/Time   NA 138 05/19/2023 2246   NA 140 01/29/2014 0937   K 4.5 05/19/2023 2246   K 4.1 01/29/2014 0937   CL 108 05/19/2023 2246   CO2 23 05/19/2023 2246   CO2 26 01/29/2014 0937   GLUCOSE 90 05/19/2023 2246   GLUCOSE 93 01/29/2014 0937   BUN 21 05/19/2023 2246   BUN 16.1 01/29/2014 0937   CREATININE 1.59 (H) 05/19/2023 2246   CREATININE 2.07 (H) 03/18/2023 0754   CREATININE 1.0 01/29/2014 0937   CALCIUM 8.4 (  L) 05/19/2023 2246   CALCIUM 9.0 01/29/2014 0937   PROT 6.5 05/19/2023 2246   PROT 6.6 01/29/2014 0937   ALBUMIN 3.8 05/19/2023 2246   ALBUMIN 3.2 (L) 01/29/2014 0937   AST 9 (L) 05/19/2023 2246   AST 10 (L) 03/18/2023 0754   AST 27 01/29/2014 0937   ALT 5 05/19/2023 2246   ALT 7 03/18/2023 0754   ALT 29 01/29/2014 0937   ALKPHOS 48 05/19/2023 2246   ALKPHOS 86 01/29/2014 0937   BILITOT 0.3 05/19/2023 2246   BILITOT 0.4 03/18/2023 0754   BILITOT 0.36 01/29/2014 0937   GFRNONAA 47 (L) 05/19/2023 2246   GFRNONAA 35 (L) 03/18/2023 0754   GFRAA 57 (L) 09/20/2014 1352    No results found for: "SPEP", "UPEP"  Lab Results  Component Value Date   WBC 4.6 05/19/2023   NEUTROABS 2.5 05/19/2023   HGB 13.3 05/19/2023   HCT 40.4 05/19/2023   MCV 99.3 05/19/2023   PLT 166 05/19/2023      Chemistry      Component Value Date/Time   NA 138 05/19/2023 2246   NA 140 01/29/2014 0937   K 4.5 05/19/2023 2246   K 4.1 01/29/2014 0937   CL 108 05/19/2023 2246   CO2 23 05/19/2023 2246   CO2 26 01/29/2014 0937   BUN 21 05/19/2023 2246   BUN 16.1 01/29/2014 0937   CREATININE 1.59 (H) 05/19/2023 2246   CREATININE 2.07 (H) 03/18/2023 0754   CREATININE 1.0 01/29/2014 0937      Component Value Date/Time   CALCIUM 8.4 (L) 05/19/2023 2246   CALCIUM 9.0 01/29/2014 0937    ALKPHOS 48 05/19/2023 2246   ALKPHOS 86 01/29/2014 0937   AST 9 (L) 05/19/2023 2246   AST 10 (L) 03/18/2023 0754   AST 27 01/29/2014 0937   ALT 5 05/19/2023 2246   ALT 7 03/18/2023 0754   ALT 29 01/29/2014 0937   BILITOT 0.3 05/19/2023 2246   BILITOT 0.4 03/18/2023 0754   BILITOT 0.36 01/29/2014 0937       RADIOGRAPHIC STUDIES: I have personally reviewed the radiological images available and previous labs, outside records relevant to his cancer.

## 2023-08-15 NOTE — Assessment & Plan Note (Addendum)
 Adjuvant radiation completed Will obtain nonurgent CT chest ab pelvis in end of May Follow up CBC, CMP when return for CT scan. Return after CT in early June

## 2023-08-22 ENCOUNTER — Emergency Department (HOSPITAL_COMMUNITY)
Admission: EM | Admit: 2023-08-22 | Discharge: 2023-08-23 | Disposition: A | Discharge status: Skilled Nursing Facility | Attending: Emergency Medicine | Admitting: Emergency Medicine

## 2023-08-22 ENCOUNTER — Encounter (HOSPITAL_COMMUNITY): Payer: Self-pay | Admitting: *Deleted

## 2023-08-22 ENCOUNTER — Other Ambulatory Visit: Payer: Self-pay

## 2023-08-22 DIAGNOSIS — R1084 Generalized abdominal pain: Secondary | ICD-10-CM | POA: Diagnosis not present

## 2023-08-22 DIAGNOSIS — M545 Low back pain, unspecified: Secondary | ICD-10-CM | POA: Insufficient documentation

## 2023-08-22 DIAGNOSIS — G8929 Other chronic pain: Secondary | ICD-10-CM

## 2023-08-22 DIAGNOSIS — C679 Malignant neoplasm of bladder, unspecified: Secondary | ICD-10-CM

## 2023-08-22 DIAGNOSIS — J449 Chronic obstructive pulmonary disease, unspecified: Secondary | ICD-10-CM | POA: Diagnosis not present

## 2023-08-22 LAB — CBC WITH DIFFERENTIAL/PLATELET
Abs Immature Granulocytes: 0.01 10*3/uL (ref 0.00–0.07)
Basophils Absolute: 0 10*3/uL (ref 0.0–0.1)
Basophils Relative: 1 %
Eosinophils Absolute: 0.3 10*3/uL (ref 0.0–0.5)
Eosinophils Relative: 7 %
HCT: 44.6 % (ref 39.0–52.0)
Hemoglobin: 14.4 g/dL (ref 13.0–17.0)
Immature Granulocytes: 0 %
Lymphocytes Relative: 19 %
Lymphs Abs: 0.8 10*3/uL (ref 0.7–4.0)
MCH: 32.6 pg (ref 26.0–34.0)
MCHC: 32.3 g/dL (ref 30.0–36.0)
MCV: 100.9 fL — ABNORMAL HIGH (ref 80.0–100.0)
Monocytes Absolute: 0.4 10*3/uL (ref 0.1–1.0)
Monocytes Relative: 10 %
Neutro Abs: 2.5 10*3/uL (ref 1.7–7.7)
Neutrophils Relative %: 63 %
Platelets: 214 10*3/uL (ref 150–400)
RBC: 4.42 MIL/uL (ref 4.22–5.81)
RDW: 13.2 % (ref 11.5–15.5)
WBC: 4 10*3/uL (ref 4.0–10.5)
nRBC: 0 % (ref 0.0–0.2)

## 2023-08-22 LAB — COMPREHENSIVE METABOLIC PANEL WITH GFR
ALT: 9 U/L (ref 0–44)
AST: 21 U/L (ref 15–41)
Albumin: 3.7 g/dL (ref 3.5–5.0)
Alkaline Phosphatase: 55 U/L (ref 38–126)
Anion gap: 7 (ref 5–15)
BUN: 19 mg/dL (ref 8–23)
CO2: 23 mmol/L (ref 22–32)
Calcium: 9 mg/dL (ref 8.9–10.3)
Chloride: 107 mmol/L (ref 98–111)
Creatinine, Ser: 1.49 mg/dL — ABNORMAL HIGH (ref 0.61–1.24)
GFR, Estimated: 51 mL/min — ABNORMAL LOW (ref >60)
Glucose, Bld: 91 mg/dL (ref 70–99)
Potassium: 4.5 mmol/L (ref 3.5–5.1)
Sodium: 137 mmol/L (ref 135–145)
Total Bilirubin: 1.2 mg/dL (ref 0.0–1.2)
Total Protein: 7.3 g/dL (ref 6.5–8.1)

## 2023-08-22 LAB — URINALYSIS, W/ REFLEX TO CULTURE (INFECTION SUSPECTED)
Bilirubin Urine: NEGATIVE
Glucose, UA: NEGATIVE mg/dL
Hgb urine dipstick: NEGATIVE
Ketones, ur: NEGATIVE mg/dL
Nitrite: POSITIVE — AB
Protein, ur: NEGATIVE mg/dL
Specific Gravity, Urine: 1.012 (ref 1.005–1.030)
WBC, UA: >50 WBC/hpf (ref 0–5)
pH: 6 (ref 5.0–8.0)

## 2023-08-22 LAB — LIPASE, BLOOD: Lipase: 28 U/L (ref 11–51)

## 2023-08-22 MED ORDER — OXYCODONE HCL 5 MG PO TABS
10.0000 mg | ORAL_TABLET | Freq: Once | ORAL | Status: AC
  Administered 2023-08-22: 10 mg via ORAL
  Filled 2023-08-22: qty 2
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
## 2023-08-22 NOTE — ED Triage Notes (Signed)
 Pt was sent here for evaluation by SNF.  Pt was discharged from [facility] Friday and left but came back yesterday and was told to leave and [employer] was involved and he was permitted to stay but today they have sent him here for evaluation.  VSS for ems.  Pt is alert and oriented.

## 2023-08-22 NOTE — ED Provider Notes (Signed)
 Care assumed at 2330.  Patient with history of sacral chondroma currently undergoing care at [facility] here with headache and vomiting.  Care assumed pending MRI.  MRI with possible findings of idiopathic intracranial hypertension.  Patient's headache is significantly improved after medications.  Do not feel that LP is warranted at this time given improvement in symptoms.  Discussed findings of studies with patient and family at bedside.  Plan to discharge home with outpatient follow-up.   Tilden Fossa, MD 03/07/23 [phone]

## 2023-08-23 ENCOUNTER — Emergency Department (HOSPITAL_COMMUNITY)

## 2023-08-23 DIAGNOSIS — M545 Low back pain, unspecified: Secondary | ICD-10-CM | POA: Diagnosis not present

## 2023-08-23 MED ORDER — OXYCODONE HCL 5 MG PO TABS
10.0000 mg | ORAL_TABLET | ORAL | Status: AC
  Administered 2023-08-23: 10 mg via ORAL
  Filled 2023-08-23: qty 2

## 2023-08-23 MED ORDER — ACETAMINOPHEN 325 MG PO TABS
650.0000 mg | ORAL_TABLET | Freq: Once | ORAL | Status: DC
  Filled 2023-08-23: qty 2

## 2023-08-23 MED ORDER — CLONAZEPAM 0.5 MG PO TABS
0.5000 mg | ORAL_TABLET | Freq: Once | ORAL | Status: AC
  Administered 2023-08-23: 0.5 mg via ORAL
  Filled 2023-08-23: qty 1

## 2023-08-23 MED ORDER — IOHEXOL 300 MG/ML  SOLN
75.0000 mL | Freq: Once | INTRAMUSCULAR | Status: AC | PRN
  Administered 2023-08-23: 75 mL via INTRAVENOUS

## 2023-08-23 NOTE — ED Notes (Addendum)
 RN called [facility] and s/w [place] giving report and informing her that patient has been DC
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
## 2023-08-23 NOTE — Discharge Instructions (Signed)
 Please resume home medications at your facility and follow up with your doctor

## 2023-08-23 NOTE — ED Notes (Signed)
 PTAR called

## 2023-08-23 NOTE — ED Provider Notes (Addendum)
 Emergency Medicine Observation Re-evaluation Note  Todd [name]. is a 68 y.o. male, seen on rounds today.  Pt initially presented to the ED for complaints of No chief complaint on file. Currently, the patient is ready for discharge back to facility. Patient left his facility voluntarily and is per note went to a different place.  That did not work out and he wants to go back to his facility.  He has history of bladder carcinoma with mets, stroke, previous radical cystoprostatectomy and other comorbidities with chronic pain Physical Exam  BP (!) 111/51   Pulse 63   Temp 98 F (36.7 C) (Oral)   Resp 17   SpO2 98%  Physical Exam General: Well-developed no acute distress Cardiac: Normal heart rate and blood pressure Lungs: Normal respiratory rate and oxygen sats Psych: No SI or HI  ED Course / MDM  EKG:   I have reviewed the labs performed to date as well as medications administered while in observation.  Recent changes in the last 24 hours include labs with stable renal insufficiency.,  CT abdomen without acute abnormalities  Plan  Current plan is for patient is returning to skilled facility.    Auston Blush, MD 08/23/23 1322    Auston Blush, MD 08/23/23 1340

## 2023-08-23 NOTE — ED Notes (Signed)
 Patient change urostomy bag after shower, tolerated well. He received lunch tray. No complaints at this time

## 2023-08-23 NOTE — Care Management (Addendum)
 Per chart review patient has PMH: bladder cancer and sees [facility] for treatment, last telephone visit on 08/15/23. This RNCM spoke with Tammy with [facility] re: request for hard script for oxycodone . SNF is requesting 3 day supply of oxycodone  or tramadol.  This RNCM spoke with EDP who reports the MD at the SNF should be able to write for the pain medication. Notified Tammy with [facility]/ SNF that EDP is unwilling to write for oxycodone  and feels patient is stable for discharge.   Awaiting a response from Tammy, as patient is currently up for discharge.    - 2:10pm Received response from Tammy w/[facility] regarding EDP unwilling to write pain meds (oxycodone  or tramadol). Tammy is aware patient is medically stable for discharge.   No additional TOC needs.

## 2023-08-23 NOTE — Progress Notes (Addendum)
 CSW awaiting response from [place] at [facility] regarding whether pt is able to return to facility and when. TOC following.   Addend @ 8:55AM Per Aundra Lee, pt will need new auth and can admit once it is approved. Awaiting PT eval.

## 2023-08-23 NOTE — ED Notes (Signed)
 RN gave patient clothes so he may complete shower and hygiene prior to being transported
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
## 2023-08-26 LAB — URINE CULTURE: Culture: >=100000 — AB

## 2023-08-27 ENCOUNTER — Telehealth (HOSPITAL_BASED_OUTPATIENT_CLINIC_OR_DEPARTMENT_OTHER): Payer: Self-pay | Admitting: *Deleted

## 2023-08-27 NOTE — Telephone Encounter (Signed)
 Post ED Visit - Positive Culture Follow-up  Culture report reviewed by antimicrobial stewardship pharmacist: [facility] []  Court [place], Pharm.D. []  Skeet Duke, Pharm.D., BCPS AQ-ID []  Leslee Rase, Pharm.D., BCPS []  Garland Junk, [address].D., BCPS []  [place], [address].D., BCPS, AAHIVP []  Alcide Aly, Pharm.D., BCPS, AAHIVP []  Jerri Morale, PharmD, BCPS []  Graham Laws, PharmD, BCPS []  Cleda Curly, PharmD, BCPS []  Tamar Fairly, PharmD []  Ballard Levels, PharmD, BCPS []  Ollen Beverage, PharmD  [facility] [x]  Arlyne Bering, PharmD []  Sherryle Don, PharmD []  Van [place]elinas, PharmD []  Delila Felty, Rph []  [place]) Cleora Daft, PharmD []  Augustina Block, PharmD []  Arie Kurtz, PharmD []  Sharlyn Deaner, PharmD []  Agnes Hose, PharmD []  Kendall Pauls, PharmD []  Gladstone Lamer, PharmD []  Armanda Bern, PharmD []  Tera Fellows, PharmD   Positive urine culture No treatment per Valentina Gasman, PA  Georgine Kitchens 08/27/2023, 11:56 AM

## 2023-08-27 NOTE — Progress Notes (Signed)
 ED Antimicrobial Stewardship Positive Culture Follow Up   Todd [name]. is an 68 y.o. male who presented to [facility] on 08/22/2023 with a chief complaint of No chief complaint on file.   Recent Results (from the past 720 hours)  Urine Culture     Status: Abnormal   Collection Time: 08/22/23 11:23 PM   Specimen: Urine, Random  Result Value Ref Range Status   Specimen Description   Final    URINE, RANDOM Performed at [facility], [address] [place], [place] [address]    Special Requests   Final    NONE Reflexed from [id number] Performed at [facility], [address], [place], [place] [address]    Culture (A)  Final    >=100,000 COLONIES/mL ESCHERICHIA COLI 40,000 COLONIES/mL KLEBSIELLA PNEUMONIAE    Report Status 08/26/2023 FINAL  Final   Organism ID, Bacteria ESCHERICHIA COLI (A)  Final   Organism ID, Bacteria KLEBSIELLA PNEUMONIAE (A)  Final      Susceptibility   Escherichia coli - MIC*    AMPICILLIN >=32 RESISTANT Resistant     CEFAZOLIN  <=4 SENSITIVE Sensitive     CEFEPIME <=0.12 SENSITIVE Sensitive     CEFTRIAXONE  <=0.25 SENSITIVE Sensitive     CIPROFLOXACIN  >=4 RESISTANT Resistant     GENTAMICIN <=1 SENSITIVE Sensitive     IMIPENEM <=0.25 SENSITIVE Sensitive     NITROFURANTOIN <=16 SENSITIVE Sensitive     TRIMETH/SULFA <=20 SENSITIVE Sensitive     AMPICILLIN/SULBACTAM 16 INTERMEDIATE Intermediate     PIP/TAZO <=4 SENSITIVE Sensitive ug/mL    * >=100,000 COLONIES/mL ESCHERICHIA COLI   Klebsiella pneumoniae - MIC*    AMPICILLIN >=32 RESISTANT Resistant     CEFAZOLIN  <=4 SENSITIVE Sensitive     CEFEPIME <=0.12 SENSITIVE Sensitive     CEFTRIAXONE  <=0.25 SENSITIVE Sensitive     CIPROFLOXACIN  <=0.25 SENSITIVE Sensitive     GENTAMICIN <=1 SENSITIVE Sensitive     IMIPENEM <=0.25 SENSITIVE Sensitive     NITROFURANTOIN 64 INTERMEDIATE Intermediate     TRIMETH/SULFA <=20 SENSITIVE Sensitive     AMPICILLIN/SULBACTAM 8 SENSITIVE  Sensitive     PIP/TAZO <=4 SENSITIVE Sensitive ug/mL    * 40,000 COLONIES/mL KLEBSIELLA PNEUMONIAE   Plan: no treatment needed, UCx from urostomy, CT negative ED Provider: Valentina Gasman, PA   Arlyne Bering T 08/27/2023, 11:44 AM Clinical Pharmacist [phone]

## 2023-09-02 ENCOUNTER — Other Ambulatory Visit: Payer: Self-pay

## 2023-09-02 DIAGNOSIS — C679 Malignant neoplasm of bladder, unspecified: Secondary | ICD-10-CM

## 2023-09-12 ENCOUNTER — Ambulatory Visit (HOSPITAL_COMMUNITY)
Admission: RE | Admit: 2023-09-12 | Discharge: 2023-09-12 | Disposition: A | Discharge status: Home / Self Care | Payer: Medicare (Managed Care) | Source: Ambulatory Visit

## 2023-09-12 ENCOUNTER — Encounter (HOSPITAL_COMMUNITY): Payer: Self-pay

## 2023-09-12 DIAGNOSIS — C679 Malignant neoplasm of bladder, unspecified: Secondary | ICD-10-CM | POA: Diagnosis present

## 2023-09-12 DIAGNOSIS — C775 Secondary and unspecified malignant neoplasm of intrapelvic lymph nodes: Secondary | ICD-10-CM | POA: Diagnosis present

## 2023-09-12 MED ORDER — IOHEXOL 300 MG/ML  SOLN
75.0000 mL | Freq: Once | INTRAMUSCULAR | Status: AC | PRN
  Administered 2023-09-12: 55 mL via INTRAVENOUS

## 2023-09-12 MED ORDER — SODIUM CHLORIDE (PF) 0.9 % IJ SOLN
INTRAMUSCULAR | Status: AC
  Filled 2023-09-12: qty 50

## 2023-09-15 ENCOUNTER — Ambulatory Visit: Payer: Self-pay

## 2023-09-15 ENCOUNTER — Other Ambulatory Visit: Payer: Self-pay

## 2023-09-15 DIAGNOSIS — C775 Secondary and unspecified malignant neoplasm of intrapelvic lymph nodes: Secondary | ICD-10-CM

## 2023-09-15 DIAGNOSIS — Z936 Other artificial openings of urinary tract status: Secondary | ICD-10-CM

## 2023-09-16 ENCOUNTER — Emergency Department (HOSPITAL_BASED_OUTPATIENT_CLINIC_OR_DEPARTMENT_OTHER): Payer: Medicare (Managed Care)

## 2023-09-16 ENCOUNTER — Emergency Department (HOSPITAL_BASED_OUTPATIENT_CLINIC_OR_DEPARTMENT_OTHER)
Admission: EM | Admit: 2023-09-16 | Discharge: 2023-09-16 | Disposition: A | Discharge status: Home / Self Care | Payer: Medicare (Managed Care) | Attending: Emergency Medicine | Admitting: Emergency Medicine

## 2023-09-16 ENCOUNTER — Other Ambulatory Visit: Payer: Self-pay

## 2023-09-16 ENCOUNTER — Emergency Department (HOSPITAL_BASED_OUTPATIENT_CLINIC_OR_DEPARTMENT_OTHER): Payer: Medicare (Managed Care) | Admitting: Radiology

## 2023-09-16 DIAGNOSIS — J449 Chronic obstructive pulmonary disease, unspecified: Secondary | ICD-10-CM | POA: Diagnosis not present

## 2023-09-16 DIAGNOSIS — Z7982 Long term (current) use of aspirin: Secondary | ICD-10-CM | POA: Diagnosis not present

## 2023-09-16 DIAGNOSIS — F17210 Nicotine dependence, cigarettes, uncomplicated: Secondary | ICD-10-CM | POA: Diagnosis not present

## 2023-09-16 DIAGNOSIS — W1809XA Striking against other object with subsequent fall, initial encounter: Secondary | ICD-10-CM | POA: Diagnosis not present

## 2023-09-16 DIAGNOSIS — Z8673 Personal history of transient ischemic attack (TIA), and cerebral infarction without residual deficits: Secondary | ICD-10-CM | POA: Diagnosis not present

## 2023-09-16 DIAGNOSIS — R519 Headache, unspecified: Secondary | ICD-10-CM | POA: Insufficient documentation

## 2023-09-16 DIAGNOSIS — Z8551 Personal history of malignant neoplasm of bladder: Secondary | ICD-10-CM | POA: Insufficient documentation

## 2023-09-16 DIAGNOSIS — M25512 Pain in left shoulder: Secondary | ICD-10-CM | POA: Insufficient documentation

## 2023-09-16 DIAGNOSIS — M542 Cervicalgia: Secondary | ICD-10-CM | POA: Insufficient documentation

## 2023-09-16 DIAGNOSIS — Y92129 Unspecified place in nursing home as the place of occurrence of the external cause: Secondary | ICD-10-CM | POA: Diagnosis not present

## 2023-09-16 DIAGNOSIS — W19XXXA Unspecified fall, initial encounter: Secondary | ICD-10-CM

## 2023-09-16 DIAGNOSIS — Z85038 Personal history of other malignant neoplasm of large intestine: Secondary | ICD-10-CM | POA: Insufficient documentation

## 2023-09-16 MED ORDER — OXYCODONE-ACETAMINOPHEN 5-325 MG PO TABS
1.0000 | ORAL_TABLET | Freq: Once | ORAL | Status: AC
  Administered 2023-09-16: 1 via ORAL
  Filled 2023-09-16: qty 1

## 2023-09-16 NOTE — ED Triage Notes (Signed)
 Pt brought in by [facility] from [facility] for mechanical fall. Went to sit down on bench and it broke. Fell backwards hitting head. No LOC or blood thinners. C/O of left back side head pain and neck pain, left shoulder pain, blurred vision and anxiety.    VS: 170/64 74 HR  94% Room Air 129 glucose

## 2023-09-16 NOTE — ED Provider Notes (Signed)
 [place] EMERGENCY DEPARTMENT AT [facility] Provider Note   CSN: [id number] Arrival date & time: 09/16/23  1321     History  No chief complaint on file.   Todd [name]. is a 68 y.o. male.  HPI   68 year old male presents emergency department after fall.  States that he lives at a assisted living facility and was sitting on a bench.  States that the wood was brought beneath him and collapsed.  Says he fell to the ground hitting his head on the concrete.  Denies LOC, blood thinner use.  Does report continued headache as well as some neck pain and left shoulder pain from the fall.  Denies any visual service, gait abnormality from baseline, new weakness or sensory deficits in upper or lower extremities, slurred speech, facial droop.  Does report history of stroke with right-sided deficits both arm and leg.  Denies any chest pain, abdominal pain, nausea, vomiting.  Past medical history significant for COPD, GERD, chronic pain syndrome, metastatic bladder cancer, colon cancer,  Home Medications Prior to Admission medications   Medication Sig Start Date End Date Taking? Authorizing Provider  acetaminophen  (TYLENOL ) 500 MG tablet Take 1,000 mg by mouth in the morning, at noon, and at bedtime.    [provider]  albuterol (PROVENTIL HFA;VENTOLIN HFA) 108 (90 BASE) MCG/ACT inhaler Inhale 1 puff into the lungs every 6 (six) hours as needed for wheezing or shortness of breath.    [provider]  aspirin 81 MG chewable tablet Chew 81 mg by mouth daily. 06/30/23   [provider]  atorvastatin (LIPITOR) 80 MG tablet Take 1 tablet by mouth daily. 06/30/23   [provider]  bisacodyl (DULCOLAX) 10 MG suppository Place 10 mg rectally daily as needed for moderate constipation.    [provider]  budesonide-formoterol (SYMBICORT) 160-4.5 MCG/ACT inhaler Inhale 2 puffs into the lungs 2 (two) times daily as needed (for respiratory flares). Patient  not taking: Reported on 08/23/2023    [provider]  clonazePAM  (KLONOPIN ) 1 MG tablet Take 1 tablet (1 mg total) by mouth 3 (three) times daily as needed for anxiety. Patient not taking: Reported on 08/23/2023 06/03/23   Kenith Payer, MD  cyanocobalamin  (VITAMIN B12) 1000 MCG tablet Take 1,000 mcg by mouth daily.    [provider]  diclofenac Sodium (VOLTAREN) 1 % GEL Apply 2 g topically 4 (four) times daily as needed (pain). 05/05/23 05/04/24  [provider]  docusate sodium  (COLACE) 100 MG capsule Take 100 mg by mouth 2 (two) times daily.    [provider]  DULoxetine (CYMBALTA) 60 MG capsule Take 1 capsule by mouth daily. 07/01/23   [provider]  gabapentin  (NEURONTIN ) 100 MG capsule Take 100 mg by mouth at bedtime. Bedtime dose = 400 mg    [provider]  gabapentin  (NEURONTIN ) 300 MG capsule Take 300 mg by mouth 3 (three) times daily. 06/30/23   [provider]  lidocaine  (LIDODERM ) 5 % Place 1 patch onto the skin daily. Remove & Discard patch within 12 hours or as directed by MD    [provider]  melatonin 3 MG TABS tablet Take 3 mg by mouth at bedtime as needed (sleep). 06/30/23   [provider]  mirtazapine (REMERON) 7.5 MG tablet Take 7.5 mg by mouth at bedtime. 06/30/23   [provider]  morphine  (AVINZA ) 30 MG 24 hr capsule Take 30 mg by mouth in the morning and at bedtime.  [provider]  Multiple Vitamin (MULTIVITAMIN WITH MINERALS) TABS tablet Take 1 tablet by mouth daily.    [provider]  Oxycodone  HCl 10 MG TABS Take 20 mg by mouth every 4 (four) hours as needed (moderate to severe pain). 08/11/23   [provider]  pantoprazole  (PROTONIX ) 40 MG tablet Take 1 tablet (40 mg total) by mouth 2 (two) times daily. Patient taking differently: Take 40 mg by mouth daily. 05/21/19   Armbruster, Lendon Queen, MD  polyethylene glycol (MIRALAX  / GLYCOLAX ) 17 g packet Take  17 g by mouth daily.    [provider]  tiZANidine (ZANAFLEX) 2 MG tablet Take 2 mg by mouth every 6 (six) hours as needed for muscle spasms. 06/30/23   [provider]  valsartan  (DIOVAN ) 160 MG tablet Take 160 mg by mouth daily.    [provider]  valsartan -hydrochlorothiazide  (DIOVAN -HCT) 160-12.5 MG tablet Take 1 tablet by mouth daily. Patient not taking: Reported on 08/23/2023    [provider]      Allergies    Clindamycin /lincomycin, Milk-related compounds, and Hydrocodone    Review of Systems   Review of Systems  All other systems reviewed and are negative.   Physical Exam Updated Vital Signs BP (!) 151/85   Pulse 69   Temp 99.3 F (37.4 C) (Oral)   Resp 18   SpO2 96%  Physical Exam Vitals and nursing note reviewed.  Constitutional:      General: He is not in acute distress.    Appearance: He is well-developed.  HENT:     Head: Normocephalic and atraumatic.  Eyes:     Conjunctiva/sclera: Conjunctivae normal.  Cardiovascular:     Rate and Rhythm: Normal rate and regular rhythm.     Heart sounds: No murmur heard. Pulmonary:     Effort: Pulmonary effort is normal. No respiratory distress.     Breath sounds: Normal breath sounds.  Abdominal:     Palpations: Abdomen is soft.     Tenderness: There is no abdominal tenderness.     Comments: Ostomy site in place without purulent drainage, erythema, induration, palpable fluctuance.  Musculoskeletal:        General: No swelling.     Cervical back: Neck supple.     Comments: Midline tenderness cervical spine with paraspinal tenderness of the left cervical region with extension down left trapezial ridge.  No midline tenderness of thoracic or lumbar spine.  No chest wall tenderness.  No tenderness of bilateral upper or lower extremities.  Skin:    General: Skin is warm and dry.     Capillary Refill: Capillary refill takes less than 2 seconds.  Neurological:     Mental Status: He is  alert.     Comments: Alert and oriented to self, place, time and event.   Speech is fluent, clear without dysarthria or dysphasia.   Strength muscular strength 4 out of 5 right upper and lower extremity 5 out of 5 left upper and lower extremity.     Sensation intact in upper/lower extremities   Gait not assessed CN I not tested  CN II not tested  CN III, IV, VI PERRLA and EOMs intact bilaterally  CN V Intact sensation to sharp and light touch to the face  CN VII facial movements symmetric  CN VIII not tested  CN IX, X no uvula deviation, symmetric rise of soft palate  CN XI symmetric SCM and trapezius strength bilaterally  CN XII Midline tongue protrusion, symmetric  L/R movements      Psychiatric:        Mood and Affect: Mood normal.     ED Results / Procedures / Treatments   Labs (all labs ordered are listed, but only abnormal results are displayed) Labs Reviewed - No data to display  EKG None  Radiology CT Head Wo Contrast Result Date: 09/16/2023 CLINICAL DATA:  Head trauma, minor (Age >= 65y); Neck trauma (Age >= 65y). EXAM: CT HEAD WITHOUT CONTRAST CT CERVICAL SPINE WITHOUT CONTRAST TECHNIQUE: Multidetector CT imaging of the head and cervical spine was performed following the standard protocol without intravenous contrast. Multiplanar CT image reconstructions of the cervical spine were also generated. RADIATION DOSE REDUCTION: This exam was performed according to the departmental dose-optimization program which includes automated exposure control, adjustment of the mA and/or kV according to patient size and/or use of iterative reconstruction technique. COMPARISON:  MRI head from 05/19/2023. FINDINGS: CT HEAD FINDINGS Brain: No evidence of acute infarction, hemorrhage, hydrocephalus, extra-axial collection or mass lesion/mass effect. There is bilateral periventricular hypodensity, which is non-specific but most likely seen in the settings of microvascular ischemic changes. Mild  in extent. Otherwise normal appearance of brain parenchyma. Ventricles are normal. Cerebral volume is age appropriate. Vascular: No hyperdense vessel or unexpected calcification. Intracranial arteriosclerosis. Skull: Normal. Negative for fracture or focal lesion. Sinuses/Orbits: No acute finding. Other: Visualized mastoid air cells are unremarkable. No mastoid effusion. CT CERVICAL SPINE FINDINGS Alignment: Normal. This examination does not assess for ligamentous injury or stability. Skull base and vertebrae: No acute fracture. No primary bone lesion or focal pathologic process. Soft tissues and spinal canal: No prevertebral fluid or swelling. No visible canal hematoma. Disc levels: Intervertebral disc heights are largely maintained. Mild marginal osteophyte formation. No significant facet arthropathy. Upper chest: Mild-to-moderate paraseptal emphysematous changes noted in the visualized bilateral upper lobes. Other: None. IMPRESSION: 1. No acute intracranial abnormality. 2. No acute osseous injury of the cervical spine. 3. Mild-to-moderate paraseptal emphysematous changes in the visualized bilateral upper lobes. Emphysema (ICD10-J43.9). Electronically Signed   By: Beula Brunswick M.D.   On: 09/16/2023 14:38   CT Cervical Spine Wo Contrast Result Date: 09/16/2023 CLINICAL DATA:  Head trauma, minor (Age >= 65y); Neck trauma (Age >= 65y). EXAM: CT HEAD WITHOUT CONTRAST CT CERVICAL SPINE WITHOUT CONTRAST TECHNIQUE: Multidetector CT imaging of the head and cervical spine was performed following the standard protocol without intravenous contrast. Multiplanar CT image reconstructions of the cervical spine were also generated. RADIATION DOSE REDUCTION: This exam was performed according to the departmental dose-optimization program which includes automated exposure control, adjustment of the mA and/or kV according to patient size and/or use of iterative reconstruction technique. COMPARISON:  MRI head from 05/19/2023.  FINDINGS: CT HEAD FINDINGS Brain: No evidence of acute infarction, hemorrhage, hydrocephalus, extra-axial collection or mass lesion/mass effect. There is bilateral periventricular hypodensity, which is non-specific but most likely seen in the settings of microvascular ischemic changes. Mild in extent. Otherwise normal appearance of brain parenchyma. Ventricles are normal. Cerebral volume is age appropriate. Vascular: No hyperdense vessel or unexpected calcification. Intracranial arteriosclerosis. Skull: Normal. Negative for fracture or focal lesion. Sinuses/Orbits: No acute finding. Other: Visualized mastoid air cells are unremarkable. No mastoid effusion. CT CERVICAL SPINE FINDINGS Alignment: Normal. This examination does not assess for ligamentous injury or stability. Skull base and vertebrae: No acute fracture. No primary bone lesion or focal pathologic process. Soft tissues and spinal canal: No prevertebral fluid or swelling. No visible canal hematoma. Disc levels: Intervertebral disc  heights are largely maintained. Mild marginal osteophyte formation. No significant facet arthropathy. Upper chest: Mild-to-moderate paraseptal emphysematous changes noted in the visualized bilateral upper lobes. Other: None. IMPRESSION: 1. No acute intracranial abnormality. 2. No acute osseous injury of the cervical spine. 3. Mild-to-moderate paraseptal emphysematous changes in the visualized bilateral upper lobes. Emphysema (ICD10-J43.9). Electronically Signed   By: Beula Brunswick M.D.   On: 09/16/2023 14:38   DG Shoulder Left Result Date: 09/16/2023 CLINICAL DATA:  Left shoulder pain, status post fall. EXAM: LEFT SHOULDER - 2+ VIEW COMPARISON:  None Available. FINDINGS: No acute fracture or dislocation. No aggressive osseous lesion. Glenohumeral and acromioclavicular joints are normal in alignment. Moderate osteoarthritis of the acromioclavicular joint. No significant arthritis of glenohumeral joint. No soft tissue swelling. No  radiopaque foreign bodies. IMPRESSION: No acute osseous abnormality of the left shoulder joint. Electronically Signed   By: Beula Brunswick M.D.   On: 09/16/2023 14:32    Procedures Procedures    Medications Ordered in ED Medications  oxyCODONE -acetaminophen  (PERCOCET/ROXICET) 5-325 MG per tablet 1 tablet (1 tablet Oral Given 09/16/23 1440)  oxyCODONE -acetaminophen  (PERCOCET/ROXICET) 5-325 MG per tablet 1 tablet (1 tablet Oral Given 09/16/23 1441)    ED Course/ Medical Decision Making/ A&P                                 Medical Decision Making Amount and/or Complexity of Data Reviewed Radiology: ordered.  Risk Prescription drug management.   This patient presents to the ED for concern of fall, this involves an extensive number of treatment options, and is a complaint that carries with it a high risk of complications and morbidity.  The differential diagnosis includes CVA, fracture, strain/pain, dislocation, ligamentous/tendinous injury cardiovascular compromise, pneumothorax, hemothorax, solid organ damage, other   Co morbidities that complicate the patient evaluation  See HPI   Additional history obtained:  Additional history obtained from EMR External records from outside source obtained and reviewed including hospital records   Lab Tests:  N/a   Imaging Studies ordered:  I ordered imaging studies including CT head/cervical spin.  Left shoulder x-ray  I independently visualized and interpreted imaging which showed  CT head/cervical spine: No acute intracranial abnormality.  No acute osseous injury of cervical spine.  Mild to moderate paraseptal emphysematous changes. Left shoulder x-ray: No acute osseous abnormality I agree with the radiologist interpretation  Cardiac Monitoring: / EKG:  The patient was maintained on a cardiac monitor.  I personally viewed and interpreted the cardiac monitored which showed an underlying rhythm of: Sinus rhythm   Consultations  Obtained:  N/a   Problem List / ED Course / Critical interventions / Medication management  Fall I ordered medication including percocet   Reevaluation of the patient after these medicines showed that the patient improved I have reviewed the patients home medicines and have made adjustments as needed   Social Determinants of Health:  Chronic cigarette use.  Denies illicit drug use.   Test / Admission - Considered:  Fall Vitals signs significant for hypertension. Otherwise within normal range and stable throughout visit. Imaging studies significant for: See above 68 year old male presents emergency department after fall.  States that he lives at a assisted living facility and was sitting on a bench.  States that the wood was brought beneath him and collapsed.  Says he fell to the ground hitting his head on the concrete.  Denies LOC, blood thinner use.  Does report  continued headache as well as some neck pain and left shoulder pain from the fall.  Denies any visual service, gait abnormality from baseline, new weakness or sensory deficits in upper or lower extremities, slurred speech, facial droop.  Does report history of stroke with right-sided deficits both arm and leg.  Denies any chest pain, abdominal pain, nausea, vomiting. On exam, tenderness cervical spine as well as with paraspinal tenderness in the left cervical region with extension down left trapezial ridge.  Nonfocal neurologic exam from baseline.  Imaging studies obtained of areas of reproducible tenderness/appreciable traumatic injury were negative.  Patient reassured by findings.  Will recommend symptomatic therapy as described in AVS with close follow-up with PCP in the outpatient setting.  Treatment plan discussed with patient and he acknowledged understanding was agreeable to said plan.  Patient overall well-appearing, afebrile in no acute distress. Worrisome signs and symptoms were discussed with the patient, and the patient  acknowledged understanding to return to the ED if noticed. Patient was stable upon discharge.          Final Clinical Impression(s) / ED Diagnoses Final diagnoses:  Fall, initial encounter    Rx / DC Orders ED Discharge Orders     None         Snelling Butter, [place] 09/16/23 1459    Nicklas Barns, MD 09/17/23 [phone]

## 2023-09-16 NOTE — ED Notes (Signed)
 [facility] called for transport.

## 2023-09-16 NOTE — ED Notes (Signed)
 Reviewed discharge instructions and follow-up care with pt and daughter. Pt verbalized understanding and had no further questions. Pt exited ED without complications.

## 2023-09-16 NOTE — Discharge Instructions (Signed)
 Imaging studies today were overall reassuring.  CT scan of your head, neck, x-ray left shoulder all appeared normal from a traumatic perspective.  Continue to take your home pain medication as prescribed.  Recommend follow-up with primary care for reassessment of your symptoms.

## 2023-09-16 NOTE — ED Notes (Addendum)
 Pt contacted daughter to arrange for transport back to [facility].This RN spoke with pt's daughter on the phone to confirm. Pt NAD and stable for discharge.

## 2023-09-16 NOTE — ED Notes (Addendum)
 Spoke with Tamika at [facility] about patient being discharged.

## 2023-09-20 ENCOUNTER — Inpatient Hospital Stay: Admitting: Licensed Clinical Social Worker

## 2023-09-20 ENCOUNTER — Inpatient Hospital Stay: Payer: Medicare (Managed Care)

## 2023-09-20 VITALS — BP 127/70 | HR 100 | Temp 97.6°F | Resp 18 | Ht 70.0 in | Wt 182.6 lb

## 2023-09-20 DIAGNOSIS — C678 Malignant neoplasm of overlapping sites of bladder: Secondary | ICD-10-CM | POA: Insufficient documentation

## 2023-09-20 DIAGNOSIS — C775 Secondary and unspecified malignant neoplasm of intrapelvic lymph nodes: Secondary | ICD-10-CM | POA: Diagnosis not present

## 2023-09-20 DIAGNOSIS — C679 Malignant neoplasm of bladder, unspecified: Secondary | ICD-10-CM | POA: Diagnosis not present

## 2023-09-20 DIAGNOSIS — E538 Deficiency of other specified B group vitamins: Secondary | ICD-10-CM

## 2023-09-20 DIAGNOSIS — N1832 Chronic kidney disease, stage 3b: Secondary | ICD-10-CM

## 2023-09-20 DIAGNOSIS — N183 Chronic kidney disease, stage 3 unspecified: Secondary | ICD-10-CM | POA: Insufficient documentation

## 2023-09-20 DIAGNOSIS — Z936 Other artificial openings of urinary tract status: Secondary | ICD-10-CM

## 2023-09-20 LAB — CMP (CANCER CENTER ONLY)
ALT: 10 U/L (ref 0–44)
AST: 19 U/L (ref 15–41)
Albumin: 4.1 g/dL (ref 3.5–5.0)
Alkaline Phosphatase: 50 U/L (ref 38–126)
Anion gap: 8 (ref 5–15)
BUN: 28 mg/dL — ABNORMAL HIGH (ref 8–23)
CO2: 25 mmol/L (ref 22–32)
Calcium: 8.8 mg/dL — ABNORMAL LOW (ref 8.9–10.3)
Chloride: 106 mmol/L (ref 98–111)
Creatinine: 1.98 mg/dL — ABNORMAL HIGH (ref 0.61–1.24)
GFR, Estimated: 36 mL/min — ABNORMAL LOW (ref >60)
Glucose, Bld: 116 mg/dL — ABNORMAL HIGH (ref 70–99)
Potassium: 4.1 mmol/L (ref 3.5–5.1)
Sodium: 139 mmol/L (ref 135–145)
Total Bilirubin: 0.4 mg/dL (ref 0.0–1.2)
Total Protein: 7 g/dL (ref 6.5–8.1)

## 2023-09-20 LAB — CBC WITH DIFFERENTIAL (CANCER CENTER ONLY)
Abs Immature Granulocytes: 0.01 10*3/uL (ref 0.00–0.07)
Basophils Absolute: 0 10*3/uL (ref 0.0–0.1)
Basophils Relative: 1 %
Eosinophils Absolute: 0.3 10*3/uL (ref 0.0–0.5)
Eosinophils Relative: 8 %
HCT: 39.6 % (ref 39.0–52.0)
Hemoglobin: 13.1 g/dL (ref 13.0–17.0)
Immature Granulocytes: 0 %
Lymphocytes Relative: 28 %
Lymphs Abs: 1 10*3/uL (ref 0.7–4.0)
MCH: 32.5 pg (ref 26.0–34.0)
MCHC: 33.1 g/dL (ref 30.0–36.0)
MCV: 98.3 fL (ref 80.0–100.0)
Monocytes Absolute: 0.5 10*3/uL (ref 0.1–1.0)
Monocytes Relative: 13 %
Neutro Abs: 1.8 10*3/uL (ref 1.7–7.7)
Neutrophils Relative %: 50 %
Platelet Count: 235 10*3/uL (ref 150–400)
RBC: 4.03 MIL/uL — ABNORMAL LOW (ref 4.22–5.81)
RDW: 13.4 % (ref 11.5–15.5)
WBC Count: 3.6 10*3/uL — ABNORMAL LOW (ref 4.0–10.5)
nRBC: 0 % (ref 0.0–0.2)

## 2023-09-20 LAB — VITAMIN B12: Vitamin B-12: 339 pg/mL (ref 180–914)

## 2023-09-20 NOTE — Assessment & Plan Note (Addendum)
 Fluid goal 60-70 oz per day

## 2023-09-20 NOTE — Progress Notes (Signed)
 [facility] OFFICE PROGRESS NOTE  Patient Care Team: System, Provider Not In as PCP - General  Todd [name] is a 68 y.o.male with history of colon cancer, MIBC, MLH1 mutation, COPD, back fusion being follow up with phone call for urothelial carcinoma.   Patient has longstanding history of non-muscle invasive bladder cancer underwent multiple TURBTs since 2015.  He has received BCG, and pembrolizumab in the setting.  Last pembrolizumab was in 09/2021.  PET/CT in October 2024 showed thickening of the right posterolateral bladder wall.  Due to findings of increasing right posterior lateral bladder wall thickening and hydronephrosis suspect higher stage disease patient underwent right radical cystoprostatectomy on 02/07/23 at [facility].  Final pathology showed pT3N1 high-grade urothelial carcinoma invades perivesical soft tissue.  Residual cancer cells occupying greater than or equal to 50% of tumor bed or absence of regressive changes.  All margins negative.  1 left pelvic lymph node positive positive for malignancy.  Margins positive for CIS.   Initially we were planning on chemoradiation but due to his declined PS, hospitalization at [facility] with chest pain, elevated troponin, followed by kidney infection he underwent adjuvant radiation without chemotherapy.  Previous CT abdomen and pelvis from [facility] did not show metastases.  Report of subcentimeter hepatic foci too small to characterize.  His previous imaging has reported multiple hepatic cysts in the past.  I recommend continued monitoring.  He completed adjuvant radiation (1/27-2/28/25).  Given his borderline renal insufficiency, borderline performance status, decided to let him improve his PS and consider immunotherapy. We discussed adjuvant immunotherapy when his PS improved. Discussed follow up.  Today he is complaining of depression. He spent a lot of time on talking about his current facility and repeated voiced wants to get out of current facility. MSW  was available and talked to him.    After spending a lot of time hearing his concerning about his facility, we were able to spend some time discuss adjuvant therapy. Discussed adjuvant immunotherapy with nivolumab improved DFS. There are potential risks and benefit, potential significant side effects of immunotherapy. His PS has improved. Side effects of immunotherapy are mostly related to immune related reaction.  They depend on which organ may be affected.  Patient can have hyper or hypothyroidism, skin rash, fever, pneumonitis, hepatitis, carditis, colitis, nephritis or other endorgan damage from immune related reaction.  Severe fatal reaction such as carditis or encephalitis has been reported. Rarely severe side effects can be life changing.  After discussion Fines understands and would like to proceed. He expressed he does not want to die earlier from the disease.  Will plan on teaching and then follow up visit with lab and infusion in a few weeks.   MSW was kindly coming to the visit and help follow up with his concerns.   Diagnosis: pT3N1cM0 stage IIIA HG UC. 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 He would like to proceed. Will need teaching Schedule for cycle 1 with lab, MD visit and then infusion Will start nivo every 2 weeks. If tolerating well after few cycles, then can change to every 4 week dosage. Low serum vitamin B12 History of ileal conduit Has been B12 over few months. If persistently low, will start B12 injection Stage 3b chronic kidney  disease (HCC) Fluid goal 60-70 oz per day  Orders Placed This Encounter  Procedures   CBC with Differential (Cancer  Center Only)    Standing Status:   Future    Expected Date:   10/13/2023    Expiration Date:   10/12/2024   CMP (Cancer Center only)    Standing Status:   Future    Expected Date:   10/13/2023    Expiration Date:   10/12/2024   TSH    Standing Status:   Future    Expected Date:   10/13/2023    Expiration Date:   10/12/2024   CBC with Differential (Cancer Center Only)    Standing Status:   Future    Expected Date:   10/27/2023    Expiration Date:   10/26/2024   CMP (Cancer Center only)    Standing Status:   Future    Expected Date:   10/27/2023    Expiration Date:   10/26/2024   CBC with Differential (Cancer Center Only)    Standing Status:   Future    Expected Date:   11/10/2023    Expiration Date:   11/09/2024   CMP (Cancer Center only)    Standing Status:   Future    Expected Date:   11/10/2023    Expiration Date:   11/09/2024   TSH    Standing Status:   Future    Expected Date:   11/10/2023    Expiration Date:   11/09/2024   T4, free    Standing Status:   Future    Expected Date:   10/13/2023    Expiration Date:   10/12/2024   T4, free    Standing Status:   Future    Expected Date:   11/10/2023    Expiration Date:   11/09/2024   CBC with Differential (Cancer Center Only)    Standing Status:   Future    Expected Date:   11/24/2023    Expiration Date:   11/23/2024   CMP (Cancer Center only)    Standing Status:   Future    Expected Date:   11/24/2023    Expiration Date:   11/23/2024   CBC with Differential (Cancer Center Only)    Standing Status:   Future    Expected Date:   12/08/2023    Expiration Date:   12/07/2024   CMP (Cancer Center only)    Standing Status:   Future    Expected Date:   12/08/2023    Expiration Date:   12/07/2024   T4, free    Standing Status:   Future    Expected Date:   12/08/2023    Expiration Date:   12/07/2024   CBC with Differential (Cancer Center Only)    Standing Status:   Future    Expected Date:   12/22/2023    Expiration Date:   12/21/2024   CMP (Cancer  Center only)    Standing Status:   Future    Expected Date:   12/22/2023    Expiration Date:   12/21/2024   TSH    Standing Status:   Future    Expected Date:   12/22/2023    Expiration Date:   12/21/2024   I spent a total of 89 minutes including review of chart and various tests results, face-to-face time with the patient, discussions about results, plan of care and coordination of care plan with other providers and staff members.   [name], MD  INTERVAL HISTORY: Patient returns for follow-up. Report he fell at facility and came to ED. Report of depression. Report of no suicidal ideation but request social work to talk to him. No acute fracture found from ED.  He feels like his facility is not doing a good job asking his fall.  He says the facility is not giving him the anti-depressive medication. He thinks he did not get his pain medication at times. He says he does not want to go back to the facility but somewhere else.  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  2016-2018 He then had multiple other TURBTs with intermittent adherence/tolerability to BCG     05/27/2014 Procedure   Repeat TURBT, path again with LgTa. Only completed 2/6 BCG treatments, then lost to follow-up.    06/09/2015 Procedure   Repeat TURBT with 3 papillary tumors, pathology with LgTa and CIS.    06/2015 Imaging   CT A/P with diffuse bladder wall thickening, no definitive evidence of metastatic disease    07/21/2015 Procedure   Repeat TURBT with path showing high grade dysplasia. Again scheduled for BCG but only completed 1-2 doses.    10/19/2016 Procedure   Repeat TURBT with low grade papillary urothelial carcinoma/CIS. Received 4/6 doses of BCG. Lost to follow-up.    03/09/2019 Imaging   CT CAP with LUL 2cm GGO, minimal right urinary  bladder wall thickening of 4mm    01/22/2020 Imaging   CT A/P in the ED for N/V/D/weight loss. Bladder thickening more pronounced at 7mm.    07/19/2020 Imaging   CT urogram for hematuria showed new right lateral masslike thickening of bladder wall, measure 8mm. No evidence metastatic disease.    08/13/2020 Procedure   Repeat TURBT with invasive high-grade urothelial carcinoma with nonfocal invasion into lamina propria (CIS present), second specimen with invasive high-grade urothelial carcinoma extensively involving lamina propria, with one fragment showing involvement of muscle bundles which are equivocal for muscularis mucosae or muscularis propria (CIS again present).    09/19/2020 Procedure   Repeat TURBT for re-resection with invasive high-grade urothelial carcinoma with non-focal lamina propria invasion and CIS, no involvement of muscularis propria    11/20/2020 - 09/17/2021 Chemotherapy   C1 - 8 Pembrolizumab    12/05/2022 Imaging   MRI pelvis wo contrast -Increased size of right posterolateral bladder wall mass, now measuring up to 3.2 cm, previously 1.2 cm, with extramural extension of the mass encasing the distal right ureter and resulting in upstream right ureteral dilation. The extravesicular extension of the bladder mass also is in close contact with the vas deferens near the seminal vesicles for which abutment or invasion cannot be excluded.     01/03/2023 Imaging   CT AP -Increasing right posterior lateral bladder wall thickening, concerning for disease progression. New right ureteral obstruction associated hydroureteronephrosis.  -Multiple subcentimeter hypoattenuating foci within the liver, favored to be benign given stability since 2016.   CT Chest: No intrathoracic metastasis with stable findings since December 28.     01/17/2023 PET scan   PET [facility] - Thickening of the right posterolateral bladder wall, similar to prior CT dated 01/03/2023.  - Minimally FDG avid bilateral  inguinal lymph nodes which do not appear pathologically enlarged or abnormal in appearance. These are favored to be reactive.  -There is a possible mildly hypermetabolic nodule along the posterior  wall of the rectum which is not completely characterized on this study. Suggest correlation with colonoscopy, which patient has scheduled 01/20/2023.    02/07/2023 Pathology Results   A: Ureter margin, right distal, biopsy - Benign ureter with chronic inflammation - Negative for malignancy   B: Ureter margin, left distal, biopsy - Focal atypia, worrisome for focal involvement by urothelial carcinoma in situ   C: Bladder and prostate, cystoprostatectomy   BLADDER - Multifocal invasive high grade papillary urothelial carcinoma              - Dominant tumor is in right posterior upper bladder wall, 3.0 cm, invasive through muscularis propria with macroscopic invasion of peri-vesicle adipose tissue              - Satellite tumor in left bladder extending into left ureter, 0.9 cm, invasive into lamina propria - Extensive multifocal high grade papillary urothelial carcinoma and urothelial carcinoma in situ present in all random sections of the bladder mucosa (anterior, posterior, dome, trigone, right lateral, left lateral walls) away from invasive cancers - 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 (contiguous with carcinoma in situ of trigone), distal margin uninvolved - Margins of resection negative for invasive carcinoma - Left ureter margin of this specimen shows urothelial carcinoma in situ, superceded by additional specimens, final left ureter margin is in specimen F, which shows patchy atypia 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 - See synoptic report    PROSTATE - 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 and involves prostatic ducts - Prostatic stroma with no evidence of involvement by urothelial carcinoma, negative for prostate carcinoma - Prostatic urethral distal margin  negative   D: Lymph node, right pelvic, biopsy - Five lymph nodes with no evidence of malignancy (0/5)    E: Lymph node, left pelvic, biopsy - Metastatic carcinoma present in one of five lymph nodes (1/5), size of metastatic deposit 1.4 mm, without extranodal extension   F: True ureteral margin, left distal, biopsy - 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 - Negative for invasive carcinoma   G: True ureteral margin, right distal, biopsy - Benign ureter with chronic inflammation - Negative for malignancy  SPECIMEN    Procedure:    Radical cystoprostatectomy   TUMOR    Tumor Site:    Right lateral wall     Tumor Site:    Left lateral wall     Tumor Site:    Posterior wall     Histologic Type:    Urothelial carcinoma, invasive (conventional)     Histologic Grade:    High-grade     Tumor Size:    Greatest Dimension (Centimeters): 3.0 cm       Additional Dimension (Centimeters):    2.9 cm       Additional Dimension (Centimeters):    2.8 cm     Tumor Extent:    Invades perivesical soft tissue microscopically     Lymphatic and / or Vascular Invasion:    Not identified     Tumor Configuration:    Papillary     Tumor Configuration:    Solid / nodule     Tumor Configuration:    Flat     Treatment Effect Post Neoadjuvant Chemotherapy:    Weak and no response: residual cancer cells occupying greater than or equal to 50% of the tumor bed or absence of regressive changes (TRG3)     Tumor Comment:    Appears to have  received 1 dose of pembrolizumab, no apparent histologic response.   MARGINS    Margin Status for Invasive Tumor:    All margins negative for invasive tumor       Closest Margin(s) to Invasive tumor:    Soft tissue: right peri-vesicular margin       Distance from Invasive Tumor to Closest Margin:    13 mm     Margin Status for Carcinoma in Situ / Noninvasive Papillary Urothelial Carcinoma:    Carcinoma in situ / noninvasive  papillary urothelial carcinoma present at margin       Margin(s) Involved by Carcinoma in Situ / Noninvasive Papillary Urothelial Carcinoma:    Left ureteral   REGIONAL LYMPH NODES     Regional Lymph Node Status:          :    Tumor present in regional lymph node(s)         Number of Lymph Nodes with Tumor:    1         Size of Largest Nodal Metastatic Deposit:    0.14 cm         Nodal Site with Largest Metastatic Deposit:    Left pelvic         Size of Largest Lymph Node with Tumor:    0.7 cm         Largest Lymph Node with Tumor:    left pelvic         Extranodal Extension (ENE):    Not identified       Number of Lymph Nodes Examined:    10   pTNM CLASSIFICATION (AJCC 8th Edition)     Reporting of pT, pN, and (when applicable) pM categories is based on information available to the pathologist at the time the report is issued. As per the AJCC (Chapter 1, 8th Ed.) it is the managing physician's responsibility to establish the final pathologic stage based upon all pertinent information, including but potentially not limited to this pathology report.     Modified Classification:    y     pT Category:    pT3a     T Suffix:    (m)     pN Category:    pN1    02/20/2023 Procedure   Colonoscopy Impression:    - The examined portion of the ileum was normal.                         - The entire examined colon is normal on direct and retroflexion views.                         - Views were limited by liquid stool throughout the colon and difficulty suctioning the stool.                         - No specimens collected.    02/23/2023 Imaging   CT AP Hepatobiliary: Hypodensities in liver consistent benign cysts. Postcholecystectomy. Mild extrahepatic biliary duct dilatation not changed from prior.   03/03/2023 -  Chemotherapy   First visit to [facility] Med onc. Initially planned for chemoradiation. Due to decrease PS and recurrent hospitalization, did not pursue chemotherapy. Discussed pending  PS to consider adjuvant nivolumab after radiation.     04/06/2023 Imaging   CT AP from [facility] Relative hypoenhancement of the right kidney when compared to the left which is nonspecific.  This could be related to altered perfusion or infection/pyelonephritis. No significant hydronephrosis is identified. Clinical correlation is recommended.  LIVER: Normal liver contour. Subcentimeter hypoattenuating hepatic foci too small to characterize further on CT.    05/09/2023 - 06/10/2023 Radiation Therapy   Adjuvant IMRT to bladder 45 Gy/ 25 fxs   05/10/2023 Cancer Staging   Staging form: Urinary Bladder, AJCC 8th Edition - Clinical: Stage IIIA (cT3, cN1, cM0) - Signed by [name], MD on 05/10/2023 WHO/ISUP grade (low/high): High Grade Histologic grading system: 2 grade system   05/19/2023 Imaging   Presented to ED with abdominal pain CT AP: IMPRESSION: 1. Surgical resection of the bladder and prostate with ileal conduit present in the right lower quadrant. 2. No evidence of metastatic disease. 3. Mild delay of nephrogram on the right without hydronephrosis, possibly renovascular disease. 4. Aortic atherosclerosis.  CT chest: no metastases   08/23/2023 Imaging   CT AP IMPRESSION: Postsurgical changes as described above.   No acute abnormality is noted   09/12/2023 Imaging   CT chest: NED   10/13/2023 -  Chemotherapy   Patient is on Treatment Plan : BLADDER Nivolumab (240) q14d        PHYSICAL EXAMINATION: ECOG PERFORMANCE STATUS: 1 - Symptomatic but completely ambulatory  Vitals:   09/20/23 1345  BP: 127/70  Pulse: 100  Resp: 18  Temp: 97.6 F (36.4 C)  SpO2: 95%   Filed Weights   09/20/23 1345  Weight: 182 lb 9 oz (82.8 kg)   GENERAL: alert, no distress and comfortable. Getting up quickly. SKIN: skin color normal  LUNGS:  normal breathing effort ABDOMEN: urostomy bag with clear-yellow urine Musculoskeletal: no edema   Relevant data reviewed during this visit included  labs and imaging, pathology.

## 2023-09-20 NOTE — Progress Notes (Signed)
 [facility] Clinical Social Work Suicide Risk Assessment   Todd [name]. is a 68 y.o. male was referred to Clinical Social Work by MD/ RN for suicidal ideation  Current Mental Status: No plan to harm self or others Loss Factors: Housing issues Demographic factors:  Male and Age 23 or older CLINICAL FACTORS:  Anxiety and depression. Prescribed medication (cymbalta, buspirone) but does not believe he is receiving this medication at his living facility   Patient expresses thoughts of being okay "if something happened to him" but no plan or intent to harm himself. Frustration and depression stemming from current living situation. Pt is at [facility]. Pt expresses the following concerns: - Insurance was changed from [facility] Medicare to Medicaid without his knowledge - Not being given his pain, anxiety, and depression medication regularly - Lack of care from employees there - Meant to be working on move to assisted living and does not know if it is happening. Has been told that one place looked at his file and said he is too sick, another said not sick enough - Has been asking for a dentist appointment and has not received one  Pt is ambulatory and uses a rollator. Reports being able to do his own care for bag changes as long as he is given the materials.  Pt shared that Todd [name] (does not know last name) is the Child psychotherapist at [facility].   PLAN OF CARE:  Crisis resources given. Pt agreed to call resources as needed CSW updated primary CSW Todd [name] and requested that covering CSW Todd [name] reach out to facility to determine status on insurance and move to assisted living.    Todd Hoefle E Jacobey Gura, LCSW

## 2023-09-20 NOTE — Assessment & Plan Note (Addendum)
 Adjuvant radiation completed Discussed adjuvant therapy. He would like to proceed. Will need teaching Schedule for cycle 1 with lab, MD visit and then infusion Will start nivo every 2 weeks. If tolerating well after few cycles, then can change to every 4 week dosage.

## 2023-09-20 NOTE — Assessment & Plan Note (Addendum)
 History of ileal conduit Has been B12 over few months. If persistently low, will start B12 injection

## 2023-09-20 NOTE — Progress Notes (Signed)
 ON PATHWAY REGIMEN - Bladder  No Change  Continue With Treatment as Ordered.  Original Decision Date/Time: 03/18/2023 13:02     A cycle is every 14 days:     Nivolumab   **Always confirm dose/schedule in your pharmacy ordering system**  Patient Characteristics: Post-Cystectomy without Neoadjuvant Therapy, M0 (Pathologic Staging), pT0-4a, pN1, M0 Therapeutic Status: Post-Cystectomy without Neoadjuvant Therapy, M0 (Pathologic Staging) AJCC M Category: cM0 AJCC 8 Stage Grouping: IIIA AJCC T Category: pT3a AJCC N Category: pN1 Intent of Therapy: Curative Intent, Discussed with Patient

## 2023-09-21 ENCOUNTER — Telehealth: Payer: Self-pay

## 2023-09-21 NOTE — Telephone Encounter (Signed)
 Left a voicemail with [facility] scheduling. Informed them of appointment details for the patient. Contacted the patients home number and was able to confirm appointment details with the patient.

## 2023-09-21 NOTE — Telephone Encounter (Signed)
 Clinical Social Worker made follow up phone call to [place], the Child psychotherapist at [facility] at [phone].  Left a voicemail requesting a call back.

## 2023-09-22 ENCOUNTER — Telehealth: Payer: Self-pay

## 2023-09-22 LAB — METHYLMALONIC ACID, SERUM: Methylmalonic Acid, Quantitative: 395 nmol/L — ABNORMAL HIGH (ref 0–378)

## 2023-09-22 NOTE — Telephone Encounter (Signed)
 Patient's Child psychotherapist, Normand Beckwith, at [facility], returned CSW call.  CSW attempted to call her back.  Left voicemail with contact information and email address and requested a return call.

## 2023-09-23 ENCOUNTER — Ambulatory Visit: Payer: Self-pay

## 2023-09-23 ENCOUNTER — Other Ambulatory Visit: Payer: Self-pay

## 2023-09-23 NOTE — Telephone Encounter (Signed)
 Per Dr. Alita Irwin, called pt facility with message below. Facility nurse verbalized understanding and pt labs were faxed to [phone] Lorenza Romans, MD as well as routed to the facility.

## 2023-09-23 NOTE — Telephone Encounter (Signed)
-----   Message from [name] sent at 09/23/2023  8:23 AM EDT ----- Hi Kamani Magnussen Would you check in to his facility make sure he is getting b12 daily? If not should start. If already been taking 1000 mcg daily, please increase b12 to 1000 mcg twice daily. Thanks. ----- Message ----- From: Dannis Dy, Lab In [place] Sent: 09/20/2023   1:35 PM EDT To: [name], MD

## 2023-09-27 ENCOUNTER — Inpatient Hospital Stay

## 2023-09-27 NOTE — Progress Notes (Signed)
 [facility] CSW Progress Note  Patient left vm message stating he needed to speak with me regarding an urgent matter. CSW returned patient's call on this date. Patient denied any urgent matters, no SI/HI.   Primary concern at time of call was accessing anxiety / depression medication. Patient confirmed, this concern has been resolved.   Patient expressed concerns about living placement and desires to move into new housing. Patient has income for housing but has barrier with meeting the minimal income requirement. CSW offered to provide [facility] Affordable housing list. Patient requested CSW contact daughter to receive correct email address for housing list. CSW left VM for patient's daughter.   Patient expressed need for accessing dentistry. Patient stated he will wait to see if facility addresses concern.   Patient reported concerns about insurance switching to medicaid. CSW provided education on insurance coverage for long term care.   CSW sent email to patient's social worker at [facility] for care coordination.   Appointment scheduled for 10/05/23. CSW will send housing list once email address is received.   Maudie Sorrow, LCSW Clinical Social Worker [facility]

## 2023-09-27 NOTE — Progress Notes (Signed)
 [facility] CSW Progress Note  Addendum to note: Patient gave verbal authorization to speak with [facility] Social Worker Amador Pines. CSW sent email to social worker listing general concerns, and requesting a meeting between patient / SNF social worker.   Maudie Sorrow, LCSW Clinical Social Worker [facility]

## 2023-09-29 ENCOUNTER — Other Ambulatory Visit: Payer: Self-pay

## 2023-10-03 ENCOUNTER — Inpatient Hospital Stay: Payer: Medicare (Managed Care)

## 2023-10-03 NOTE — Progress Notes (Signed)
 [facility] CSW Progress Note  Clinical Child psychotherapist contacted patient by phone to follow-up on [employer] and facility SW question regarding pain medication.   Pain Medicine  Patient confirmed on sight medical provider is managing rx for pain medication. Patient and Facility SW have been informed [facility] medical provider will not be managing pain medication rx due to pain not being cancer related.   Housing  CSW did not receive email from patient's daughter for housing list. CSW sent list via email to [email or web address] as requested by patient.   Patient informed CSW on this date he will need to leave facility in 30 days due to balance. Patient stated he has not paid facility cost for June. Patient stated he no longer has the money to pay fee.   Patient briefly mentioned being placed in new facility. CSW discussed concerns with being placed at a facility and with current balance. CSW sent email to facility SW for additional information and to inquire how patient came remained housed.  HealthCare Patient confirmed dentistry appointment was made at facility. Patient reported being told, appt was cancelled due to overbooking. Patient will work with on sight SW to re-schedule appointment, or find community based provider.   CSW continues to be available as needed for patient. CSW unable to meet with patient on 6/25, patient verbalized understanding.   Lizbeth Sprague, LCSW Clinical Social Worker [facility]

## 2023-10-05 ENCOUNTER — Other Ambulatory Visit: Payer: Self-pay

## 2023-10-05 ENCOUNTER — Inpatient Hospital Stay: Payer: Medicare (Managed Care)

## 2023-10-05 ENCOUNTER — Other Ambulatory Visit: Payer: Self-pay | Admitting: Nurse Practitioner

## 2023-10-05 ENCOUNTER — Encounter: Payer: Self-pay | Admitting: Nurse Practitioner

## 2023-10-05 DIAGNOSIS — C775 Secondary and unspecified malignant neoplasm of intrapelvic lymph nodes: Secondary | ICD-10-CM

## 2023-10-05 DIAGNOSIS — G893 Neoplasm related pain (acute) (chronic): Secondary | ICD-10-CM

## 2023-10-05 DIAGNOSIS — C678 Malignant neoplasm of overlapping sites of bladder: Secondary | ICD-10-CM | POA: Diagnosis not present

## 2023-10-05 MED ORDER — DOCUSATE SODIUM 100 MG PO CAPS: 100.0000 mg | ORAL_CAPSULE | Freq: Two times a day (BID) | ORAL | 1 refills | Status: DC | PRN

## 2023-10-05 MED ORDER — OXYCODONE HCL 5 MG PO TABS
20.0000 mg | ORAL_TABLET | Freq: Once | ORAL | Status: AC
  Administered 2023-10-05: 20 mg via ORAL
  Filled 2023-10-05: qty 4

## 2023-10-05 NOTE — Progress Notes (Signed)
 Pharmacist Chemotherapy Monitoring - Initial Assessment    Anticipated start date: 10/13/23  The following has been reviewed per standard work regarding the patient's treatment regimen: The patient's diagnosis, treatment plan and drug doses, and organ/hematologic function Lab orders and baseline tests specific to treatment regimen  The treatment plan start date, drug sequencing, and pre-medications Prior authorization status  Patient's documented medication list, including drug-drug interaction screen and prescriptions for anti-emetics and supportive care specific to the treatment regimen The drug concentrations, fluid compatibility, administration routes, and timing of the medications to be used The patient's access for treatment and lifetime cumulative dose history, if applicable  The patient's medication allergies and previous infusion related reactions, if applicable   Changes made to treatment plan:  N/A  Follow up needed:  Home anti-emetics   Sharita Bienaime, PharmD, MBA

## 2023-10-05 NOTE — Progress Notes (Signed)
 Received message from [facility], patient is having pain during his chemo education class. Upon chart review patient is on chronic pain management to include Oxycodone  20mg  every 4 hours as needed. Patient reports his last dose was at 10am this morning. Will administer Oxycodone  20mg  po x1 for pain while in clinic.   Recommended patient be educated on pain management with specific instructions to premedicate prior to future appointments with understanding given chronic non-cancer related pain he would not be receiving regular pain management at visits.

## 2023-10-05 NOTE — Progress Notes (Signed)
 [facility] CSW Progress Note  Clinical Child psychotherapist contacted patient by phone to follow-up on SNF placement. Patient will be transferring to a boarding home placement on 7/02, patient is knowledge on transportation through his insurance once his placement changes.     Interventions: CSW provided emotional support as patient discussed new living placement and impact on pyschosocial functioning. CSW celebrated new housing with patient and discussed upcoming treatment.      Follow Up Plan:  CSW will follow-up with patient by phone     Lizbeth Sprague, LCSW Clinical Social Worker [facility]

## 2023-10-05 NOTE — Progress Notes (Signed)
 In error

## 2023-10-05 NOTE — Progress Notes (Signed)
 Orders placed per Levon, NP for 10/05/2023.

## 2023-10-12 NOTE — Progress Notes (Unsigned)
 [facility] OFFICE PROGRESS NOTE  Patient Care Team: System, Provider Not In as PCP - General  Todd [name] is a 68 y.o.male with history of colon cancer, MIBC, MLH1 mutation, COPD, back fusion with chronic back pain being follow up with for urothelial carcinoma.   Patient is ready to start cycle 1 of treatment.   He is alone today and report renting a room himself.  Discussed with patient if ok to call his daughter for update on his treatment plan he said yes and I called [name] but no answer. Left message. 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 Discussed adjuvant therapy. He would like to proceed. Cycle 1 nivo today Return every 2 weeks with lab, MD visit and then infusion Will start nivo every 2 weeks. If tolerating well after few cycles, then can change to every 4 week dosage. Stage 3a chronic kidney disease (HCC) Fluid goal 60-70 oz per day Monitor lab each visit.  I advised patient signs and symptoms of new immune related reaction.  Call us  immediately if new concerning symptoms.  He should keep a 1% hydrocortisone cream to use for mild rash as needed. If developing mild rash, apply twice daily immediately.  Call us  immediately if developing new severe symptoms.  He has chronic pain and will see his PCP Dr. Johnye on 7/14.   Report he needs transportation.  LPN Sherrilyn informed the front desk.   [name], MD  INTERVAL HISTORY: Patient returns for follow-up.  Oncology Summary Patient has longstanding history of non-muscle invasive bladder cancer underwent multiple TURBTs since 2015. He has received BCG, and pembrolizumab in the setting. Last pembrolizumab was in 09/2021. PET/CT in October 2024 showed thickening of the right posterolateral bladder wall. Due to findings of increasing right posterior lateral bladder wall thickening and hydronephrosis suspect higher stage  disease patient underwent right radical cystoprostatectomy on 02/07/23 at [facility]. Final pathology showed pT3N1 high-grade urothelial carcinoma invades perivesical soft tissue. Residual cancer cells occupying greater than or equal to 50% of tumor bed or absence of regressive changes. All margins negative. 1 left pelvic lymph node positive positive for malignancy. Margins positive for CIS.   Initially we were planning on chemoradiation but due to his declined PS, hospitalization at [facility] with chest pain, elevated troponin, followed by kidney infection he underwent adjuvant radiation without chemotherapy.  Previous CT abdomen and pelvis from [facility] did not show metastases.  Report of subcentimeter hepatic foci too small to characterize.  His previous imaging has reported multiple hepatic cysts in the past.  I recommend continued monitoring.  He completed adjuvant radiation (1/27-2/28/25).  Given his borderline renal insufficiency, borderline performance status, decided to let him improve his PS and consider immunotherapy.   He returned in June with improvement of PS. CT showed negative for metastases. We discussed risk and benefit of adjuvant immunotherapy he elected to proceed.  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  2016-2018 He then had multiple other TURBTs with intermittent adherence/tolerability to BCG     05/27/2014 Procedure   Repeat TURBT, path again with LgTa. Only completed 2/6 BCG treatments, then lost to follow-up.    06/09/2015 Procedure   Repeat TURBT with 3 papillary tumors, pathology with LgTa and CIS.    06/2015 Imaging   CT  A/P with diffuse bladder wall thickening, no definitive evidence of metastatic disease    07/21/2015 Procedure   Repeat TURBT with path showing high grade dysplasia. Again scheduled for BCG but only  completed 1-2 doses.    10/19/2016 Procedure   Repeat TURBT with low grade papillary urothelial carcinoma/CIS. Received 4/6 doses of BCG. Lost to follow-up.    03/09/2019 Imaging   CT CAP with LUL 2cm GGO, minimal right urinary bladder wall thickening of 4mm    01/22/2020 Imaging   CT A/P in the ED for N/V/D/weight loss. Bladder thickening more pronounced at 7mm.    Jun 24, 2020 Imaging   CT urogram for hematuria showed new right lateral masslike thickening of bladder wall, measure 8mm. No evidence metastatic disease.    08/13/2020 Procedure   Repeat TURBT with invasive high-grade urothelial carcinoma with nonfocal invasion into lamina propria (CIS present), second specimen with invasive high-grade urothelial carcinoma extensively involving lamina propria, with one fragment showing involvement of muscle bundles which are equivocal for muscularis mucosae or muscularis propria (CIS again present).    09/19/2020 Procedure   Repeat TURBT for re-resection with invasive high-grade urothelial carcinoma with non-focal lamina propria invasion and CIS, no involvement of muscularis propria    11/20/2020 - 09/17/2021 Chemotherapy   C1 - 8 Pembrolizumab    12/05/2022 Imaging   MRI pelvis wo contrast -Increased size of right posterolateral bladder wall mass, now measuring up to 3.2 cm, previously 1.2 cm, with extramural extension of the mass encasing the distal right ureter and resulting in upstream right ureteral dilation. The extravesicular extension of the bladder mass also is in close contact with the vas deferens near the seminal vesicles for which abutment or invasion cannot be excluded.     01/03/2023 Imaging   CT AP -Increasing right posterior lateral bladder wall thickening, concerning for disease progression. New right ureteral obstruction associated hydroureteronephrosis.  -Multiple subcentimeter hypoattenuating foci within the liver, favored to be benign given stability since 2016.   CT Chest: No  intrathoracic metastasis with stable findings since December 28.     01/17/2023 PET scan   PET from [facility] - Thickening of the right posterolateral bladder wall, similar to prior CT dated 01/03/2023.  - Minimally FDG avid bilateral inguinal lymph nodes which do not appear pathologically enlarged or abnormal in appearance. These are favored to be reactive.  -There is a possible mildly hypermetabolic nodule along the posterior wall of the rectum which is not completely characterized on this study. Suggest correlation with colonoscopy, which patient has scheduled 01/20/2023.    02/07/2023 Pathology Results   A: Ureter margin, right distal, biopsy - Benign ureter with chronic inflammation - Negative for malignancy   B: Ureter margin, left distal, biopsy - Focal atypia, worrisome for focal involvement by urothelial carcinoma in situ   C: Bladder and prostate, cystoprostatectomy   BLADDER - Multifocal invasive high grade papillary urothelial carcinoma              - Dominant tumor is in right posterior upper bladder wall, 3.0 cm, invasive through muscularis propria with macroscopic invasion of peri-vesicle adipose tissue              - Satellite tumor in left bladder extending into left ureter, 0.9 cm, invasive into lamina propria - Extensive multifocal high grade papillary urothelial carcinoma and urothelial carcinoma in situ present in all random sections of the bladder mucosa (anterior, posterior, dome, trigone, right lateral, left lateral walls) away from invasive cancers - Urothelial  carcinoma in situ involves prostatic urethra (contiguous with carcinoma in situ of trigone), distal margin uninvolved - Margins of resection negative for invasive carcinoma - Left ureter margin of this specimen shows urothelial carcinoma in situ, superceded by additional specimens, final left ureter margin is in specimen F, which shows patchy atypia 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 - See synoptic report     PROSTATE - 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 and involves prostatic ducts - Prostatic stroma with no evidence of involvement by urothelial carcinoma, negative for prostate carcinoma - Prostatic urethral distal margin negative   D: Lymph node, right pelvic, biopsy - Five lymph nodes with no evidence of malignancy (0/5)    E: Lymph node, left pelvic, biopsy - Metastatic carcinoma present in one of five lymph nodes (1/5), size of metastatic deposit 1.4 mm, without extranodal extension   F: True ureteral margin, left distal, biopsy - 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 - Negative for invasive carcinoma   G: True ureteral margin, right distal, biopsy - Benign ureter with chronic inflammation - Negative for malignancy  SPECIMEN    Procedure:    Radical cystoprostatectomy   TUMOR    Tumor Site:    Right lateral wall     Tumor Site:    Left lateral wall     Tumor Site:    Posterior wall     Histologic Type:    Urothelial carcinoma, invasive (conventional)     Histologic Grade:    High-grade     Tumor Size:    Greatest Dimension (Centimeters): 3.0 cm       Additional Dimension (Centimeters):    2.9 cm       Additional Dimension (Centimeters):    2.8 cm     Tumor Extent:    Invades perivesical soft tissue microscopically     Lymphatic and / or Vascular Invasion:    Not identified     Tumor Configuration:    Papillary     Tumor Configuration:    Solid / nodule     Tumor Configuration:    Flat     Treatment Effect Post Neoadjuvant Chemotherapy:    Weak and no response: residual cancer cells occupying greater than or equal to 50% of the tumor bed or absence of regressive changes (TRG3)     Tumor Comment:    Appears to have received 1 dose of pembrolizumab, no apparent histologic response.   MARGINS    Margin Status for Invasive Tumor:    All margins negative for invasive tumor       Closest Margin(s) to  Invasive tumor:    Soft tissue: right peri-vesicular margin       Distance from Invasive Tumor to Closest Margin:    13 mm     Margin Status for Carcinoma in Situ / Noninvasive Papillary Urothelial Carcinoma:    Carcinoma in situ / noninvasive papillary urothelial carcinoma present at margin       Margin(s) Involved by Carcinoma in Situ / Noninvasive Papillary Urothelial Carcinoma:    Left ureteral   REGIONAL LYMPH NODES     Regional Lymph Node Status:          :    Tumor present in regional lymph node(s)         Number of Lymph Nodes with Tumor:    1         Size of Largest Nodal Metastatic Deposit:  0.14 cm         Nodal Site with Largest Metastatic Deposit:    Left pelvic         Size of Largest Lymph Node with Tumor:    0.7 cm         Largest Lymph Node with Tumor:    left pelvic         Extranodal Extension (ENE):    Not identified       Number of Lymph Nodes Examined:    10   pTNM CLASSIFICATION (AJCC 8th Edition)     Reporting of pT, pN, and (when applicable) pM categories is based on information available to the pathologist at the time the report is issued. As per the AJCC (Chapter 1, 8th Ed.) it is the managing physician's responsibility to establish the final pathologic stage based upon all pertinent information, including but potentially not limited to this pathology report.     Modified Classification:    y     pT Category:    pT3a     T Suffix:    (m)     pN Category:    pN1    02/20/2023 Procedure   Colonoscopy Impression:    - The examined portion of the ileum was normal.                         - The entire examined colon is normal on direct and retroflexion views.                         - Views were limited by liquid stool throughout the colon and difficulty suctioning the stool.                         - No specimens collected.    02/23/2023 Imaging   CT AP Hepatobiliary: Hypodensities in liver consistent benign cysts. Postcholecystectomy. Mild extrahepatic  biliary duct dilatation not changed from prior.   03/03/2023 -  Chemotherapy   [facility] visit to [facility] Med onc. Initially planned for chemoradiation. Due to decrease PS and recurrent hospitalization, did not pursue chemotherapy. Discussed pending PS to consider adjuvant nivolumab after radiation.     04/06/2023 Imaging   CT [facility] Relative hypoenhancement of the right kidney when compared to the left which is nonspecific. This could be related to altered perfusion or infection/pyelonephritis. No significant hydronephrosis is identified. Clinical correlation is recommended.  LIVER: Normal liver contour. Subcentimeter hypoattenuating hepatic foci too small to characterize further on CT.    05/09/2023 - 06/10/2023 Radiation Therapy   Adjuvant IMRT to bladder 45 Gy/ 25 fxs   05/10/2023 Cancer Staging   Staging form: Urinary Bladder, AJCC 8th Edition - Clinical: Stage IIIA (cT3, cN1, cM0) - Signed by Tina Pauletta BROCKS, MD on 05/10/2023 WHO/ISUP grade (low/high): High Grade Histologic grading system: 2 grade system   05/19/2023 Imaging   Presented to ED with abdominal pain CT AP: IMPRESSION: 1. Surgical resection of the bladder and prostate with ileal conduit present in the right lower quadrant. 2. No evidence of metastatic disease. 3. Mild delay of nephrogram on the right without hydronephrosis, possibly renovascular disease. 4. Aortic atherosclerosis.  CT chest: no metastases   08/23/2023 Imaging   CT AP IMPRESSION: Postsurgical changes as described above.   No acute abnormality is noted   09/12/2023 Imaging   CT chest: NED   10/13/2023 -  Chemotherapy   Patient is on Treatment Plan : BLADDER Nivolumab (240) q14d        PHYSICAL EXAMINATION: ECOG PERFORMANCE STATUS: 1 - Symptomatic but completely ambulatory  Vitals:   10/13/23 1440  BP: (!) 165/58  Pulse: 80  Resp: 18  Temp: (!) 97 F (36.1 C)  SpO2: 96%   Filed Weights   10/13/23 1440  Weight: 188 lb 1.6 oz (85.3  kg)    GENERAL: alert, no distress and comfortable SKIN: skin color normal and no bruising or petechiae or jaundice on exposed skin LUNGS: clear to auscultation and percussion with normal breathing effort HEART: regular rate & rhythm  ABDOMEN: abdomen soft, non-tender and nondistended. Musculoskeletal: no edema   Relevant data reviewed during this visit included labs.

## 2023-10-13 ENCOUNTER — Other Ambulatory Visit: Payer: Self-pay | Admitting: *Deleted

## 2023-10-13 ENCOUNTER — Ambulatory Visit: Payer: Medicare (Managed Care)

## 2023-10-13 ENCOUNTER — Inpatient Hospital Stay: Payer: Medicare (Managed Care)

## 2023-10-13 ENCOUNTER — Inpatient Hospital Stay (HOSPITAL_BASED_OUTPATIENT_CLINIC_OR_DEPARTMENT_OTHER): Payer: Medicare (Managed Care)

## 2023-10-13 VITALS — BP 165/58 | HR 80 | Temp 97.0°F | Resp 18 | Wt 188.1 lb

## 2023-10-13 VITALS — BP 152/60 | HR 74 | Temp 98.3°F | Resp 16

## 2023-10-13 DIAGNOSIS — C678 Malignant neoplasm of overlapping sites of bladder: Secondary | ICD-10-CM | POA: Insufficient documentation

## 2023-10-13 DIAGNOSIS — C679 Malignant neoplasm of bladder, unspecified: Secondary | ICD-10-CM | POA: Diagnosis not present

## 2023-10-13 DIAGNOSIS — C7989 Secondary malignant neoplasm of other specified sites: Secondary | ICD-10-CM | POA: Diagnosis not present

## 2023-10-13 DIAGNOSIS — N1831 Chronic kidney disease, stage 3a: Secondary | ICD-10-CM

## 2023-10-13 DIAGNOSIS — Z5112 Encounter for antineoplastic immunotherapy: Secondary | ICD-10-CM | POA: Insufficient documentation

## 2023-10-13 DIAGNOSIS — Z79620 Long term (current) use of immunosuppressive biologic: Secondary | ICD-10-CM | POA: Diagnosis not present

## 2023-10-13 DIAGNOSIS — C775 Secondary and unspecified malignant neoplasm of intrapelvic lymph nodes: Secondary | ICD-10-CM

## 2023-10-13 LAB — CBC WITH DIFFERENTIAL (CANCER CENTER ONLY)
Abs Immature Granulocytes: 0.01 10*3/uL (ref 0.00–0.07)
Basophils Absolute: 0 10*3/uL (ref 0.0–0.1)
Basophils Relative: 0 %
Eosinophils Absolute: 0.3 10*3/uL (ref 0.0–0.5)
Eosinophils Relative: 8 %
HCT: 43.5 % (ref 39.0–52.0)
Hemoglobin: 14.5 g/dL (ref 13.0–17.0)
Immature Granulocytes: 0 %
Lymphocytes Relative: 21 %
Lymphs Abs: 0.8 10*3/uL (ref 0.7–4.0)
MCH: 32.4 pg (ref 26.0–34.0)
MCHC: 33.3 g/dL (ref 30.0–36.0)
MCV: 97.3 fL (ref 80.0–100.0)
Monocytes Absolute: 0.4 10*3/uL (ref 0.1–1.0)
Monocytes Relative: 11 %
Neutro Abs: 2.2 10*3/uL (ref 1.7–7.7)
Neutrophils Relative %: 60 %
Platelet Count: 201 10*3/uL (ref 150–400)
RBC: 4.47 MIL/uL (ref 4.22–5.81)
RDW: 13.1 % (ref 11.5–15.5)
WBC Count: 3.6 10*3/uL — ABNORMAL LOW (ref 4.0–10.5)
nRBC: 0 % (ref 0.0–0.2)

## 2023-10-13 LAB — CMP (CANCER CENTER ONLY)
ALT: 9 U/L (ref 0–44)
AST: 13 U/L — ABNORMAL LOW (ref 15–41)
Albumin: 3.9 g/dL (ref 3.5–5.0)
Alkaline Phosphatase: 58 U/L (ref 38–126)
Anion gap: 5 (ref 5–15)
BUN: 18 mg/dL (ref 8–23)
CO2: 29 mmol/L (ref 22–32)
Calcium: 9.2 mg/dL (ref 8.9–10.3)
Chloride: 107 mmol/L (ref 98–111)
Creatinine: 1.53 mg/dL — ABNORMAL HIGH (ref 0.61–1.24)
GFR, Estimated: 50 mL/min — ABNORMAL LOW (ref >60)
Glucose, Bld: 91 mg/dL (ref 70–99)
Potassium: 4.5 mmol/L (ref 3.5–5.1)
Sodium: 141 mmol/L (ref 135–145)
Total Bilirubin: 0.6 mg/dL (ref 0.0–1.2)
Total Protein: 7 g/dL (ref 6.5–8.1)

## 2023-10-13 LAB — T4, FREE: Free T4: 0.99 ng/dL (ref 0.61–1.12)

## 2023-10-13 LAB — TSH: TSH: 1.73 u[IU]/mL (ref 0.350–4.500)

## 2023-10-13 MED ORDER — HEPARIN SOD (PORK) LOCK FLUSH 100 UNIT/ML IV SOLN
500.0000 [IU] | Freq: Once | INTRAVENOUS | Status: DC | PRN
Start: 2023-10-13 — End: 2023-10-13

## 2023-10-13 MED ORDER — SODIUM CHLORIDE 0.9% FLUSH: 10.0000 mL | INTRAVENOUS | Status: DC | PRN

## 2023-10-13 MED ORDER — SODIUM CHLORIDE 0.9 % IV SOLN
240.0000 mg | Freq: Once | INTRAVENOUS | Status: AC
  Administered 2023-10-13: 240 mg via INTRAVENOUS
  Filled 2023-10-13: qty 24

## 2023-10-13 MED ORDER — SODIUM CHLORIDE 0.9 % IV SOLN: INTRAVENOUS | Status: DC

## 2023-10-13 NOTE — Assessment & Plan Note (Addendum)
 Fluid goal 60-70 oz per day Monitor lab each visit.

## 2023-10-13 NOTE — Patient Instructions (Signed)
 [facility] - A DEPT OF [facility]  Discharge Instructions: Thank you for choosing [facility] to provide your oncology and hematology care.   If you have a lab appointment with the Cancer Center, please go directly to the Cancer Center and check in at the registration area.   Wear comfortable clothing and clothing appropriate for easy access to any Portacath or PICC line.   We strive to give you quality time with your provider. You may need to reschedule your appointment if you arrive late (15 or more minutes).  Arriving late affects you and other patients whose appointments are after yours.  Also, if you miss three or more appointments without notifying the office, you may be dismissed from the clinic at the provider's discretion.      For prescription refill requests, have your pharmacy contact our office and allow 72 hours for refills to be completed.    Today you received the following chemotherapy and/or immunotherapy agents: Opdivo (Nivolumab)      To help prevent nausea and vomiting after your treatment, we encourage you to take your nausea medication as directed.  BELOW ARE SYMPTOMS THAT SHOULD BE REPORTED IMMEDIATELY: *FEVER GREATER THAN 100.4 F (38 C) OR HIGHER *CHILLS OR SWEATING *NAUSEA AND VOMITING THAT IS NOT CONTROLLED WITH YOUR NAUSEA MEDICATION *UNUSUAL SHORTNESS OF BREATH *UNUSUAL BRUISING OR BLEEDING *URINARY PROBLEMS (pain or burning when urinating, or frequent urination) *BOWEL PROBLEMS (unusual diarrhea, constipation, pain near the anus) TENDERNESS IN MOUTH AND THROAT WITH OR WITHOUT PRESENCE OF ULCERS (sore throat, sores in mouth, or a toothache) UNUSUAL RASH, SWELLING OR PAIN  UNUSUAL VAGINAL DISCHARGE OR ITCHING   Items with * indicate a potential emergency and should be followed up as soon as possible or go to the Emergency Department if any problems should occur.  Please show the CHEMOTHERAPY ALERT CARD or  IMMUNOTHERAPY ALERT CARD at check-in to the Emergency Department and triage nurse.  Should you have questions after your visit or need to cancel or reschedule your appointment, please contact [facility] - A DEPT OF [facility]  Dept: [phone]  and follow the prompts.  Office hours are 8:00 a.m. to 4:30 p.m. Monday - Friday. Please note that voicemails left after 4:00 p.m. may not be returned until the following business day.  We are closed weekends and major holidays. You have access to a nurse at all times for urgent questions. Please call the main number to the clinic Dept: [phone] and follow the prompts.   For any non-urgent questions, you may also contact your provider using MyChart. We now offer e-Visits for anyone 91 and older to request care online for non-urgent symptoms. For details visit [email or web address].   Also download the MyChart app! Go to the app store, search MyChart, open the app, select [place], and log in with your MyChart username and password.  Nivolumab Injection What is this medication? NIVOLUMAB (nye VOL ue mab) treats some types of cancer. It works by helping your immune system slow or stop the spread of cancer cells. It is a monoclonal antibody. This medicine may be used for other purposes; ask your health care provider or pharmacist if you have questions. COMMON BRAND NAME(S): Opdivo What should I tell my care team before I take this medication? They need to know if you have any of these conditions: Allogeneic stem cell transplant (uses someone else's stem cells)  Autoimmune diseases, such as Crohn disease, ulcerative colitis, lupus History of chest radiation Nervous system problems, such as Guillain-Barre syndrome or myasthenia gravis Organ transplant An unusual or allergic reaction to nivolumab, other medications, foods, dyes, or preservatives Pregnant or trying to get pregnant Breast-feeding How should I use  this medication? This medication is infused into a vein. It is given in a hospital or clinic setting. A special MedGuide will be given to you before each treatment. Be sure to read this information carefully each time. Talk to your care team about the use of this medication in children. While it may be prescribed for children as young as 12 years for selected conditions, precautions do apply. Overdosage: If you think you have taken too much of this medicine contact a poison control center or emergency room at once. NOTE: This medicine is only for you. Do not share this medicine with others. What if I miss a dose? Keep appointments for follow-up doses. It is important not to miss your dose. Call your care team if you are unable to keep an appointment. What may interact with this medication? Interactions have not been studied. This list may not describe all possible interactions. Give your health care provider a list of all the medicines, herbs, non-prescription drugs, or dietary supplements you use. Also tell them if you smoke, drink alcohol, or use illegal drugs. Some items may interact with your medicine. What should I watch for while using this medication? Your condition will be monitored carefully while you are receiving this medication. You may need blood work while taking this medication. This medication may cause serious skin reactions. They can happen weeks to months after starting the medication. Contact your care team right away if you notice fevers or flu-like symptoms with a rash. The rash may be red or purple and then turn into blisters or peeling of the skin. You may also notice a red rash with swelling of the face, lips, or lymph nodes in your neck or under your arms. Tell your care team right away if you have any change in your eyesight. Talk to your care team if you are pregnant or think you might be pregnant. A negative pregnancy test is required before starting this medication. A  reliable form of contraception is recommended while taking this medication and for 5 months after the last dose. Talk to your care team about effective forms of contraception. Do not breast-feed while taking this medication and for 5 months after the last dose. What side effects may I notice from receiving this medication? Side effects that you should report to your care team as soon as possible: Allergic reactions--skin rash, itching, hives, swelling of the face, lips, tongue, or throat Dry cough, shortness of breath or trouble breathing Eye pain, redness, irritation, or discharge with blurry or decreased vision Heart muscle inflammation--unusual weakness or fatigue, shortness of breath, chest pain, fast or irregular heartbeat, dizziness, swelling of the ankles, feet, or hands Hormone gland problems--headache, sensitivity to light, unusual weakness or fatigue, dizziness, fast or irregular heartbeat, increased sensitivity to cold or heat, excessive sweating, constipation, hair loss, increased thirst or amount of urine, tremors or shaking, irritability Infusion reactions--chest pain, shortness of breath or trouble breathing, feeling faint or lightheaded Kidney injury (glomerulonephritis)--decrease in the amount of urine, red or dark brown urine, foamy or bubbly urine, swelling of the ankles, hands, or feet Liver injury--right upper belly pain, loss of appetite, nausea, light-colored stool, dark yellow or brown urine, yellowing skin  or eyes, unusual weakness or fatigue Pain, tingling, or numbness in the hands or feet, muscle weakness, change in vision, confusion or trouble speaking, loss of balance or coordination, trouble walking, seizures Rash, fever, and swollen lymph nodes Redness, blistering, peeling, or loosening of the skin, including inside the mouth Sudden or severe stomach pain, bloody diarrhea, fever, nausea, vomiting Side effects that usually do not require medical attention (report these  to your care team if they continue or are bothersome): Bone, joint, or muscle pain Diarrhea Fatigue Loss of appetite Nausea Skin rash This list may not describe all possible side effects. Call your doctor for medical advice about side effects. You may report side effects to FDA at 1-800-FDA-1088. Where should I keep my medication? This medication is given in a hospital or clinic. It will not be stored at home. NOTE: This sheet is a summary. It may not cover all possible information. If you have questions about this medicine, talk to your doctor, pharmacist, or health care provider.  2024 Elsevier/Gold Standard (2021-07-27 00:00:00)

## 2023-10-13 NOTE — Assessment & Plan Note (Addendum)
 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 Discussed adjuvant therapy. He would like to proceed. Cycle 1 nivo today Return every 2 weeks with lab, MD visit and then infusion Will start nivo every 2 weeks. If tolerating well after few cycles, then can change to every 4 week dosage.

## 2023-10-17 ENCOUNTER — Telehealth: Payer: Self-pay

## 2023-10-17 NOTE — Telephone Encounter (Signed)
-----   Message from Nurse Almarie A sent at 10/13/2023  4:56 PM EDT ----- Regarding: First time 10/13/23- First time Opdivo . Tolerated well. A little fuzzy on educational details even with reinforcement.

## 2023-10-17 NOTE — Telephone Encounter (Signed)
 Todd [name] states that he is weak. He is in the bed. He is lives in a room that he rents from a women in her house.  He is eating a little, drinking Gatorade not getting in a lot of fluid, and urinating well. He states that he is concerned with the weakness as he has difficulty with balance. Told him that this message would be sent to Dr. Tina to review. Todd [name] would only be able to come to office today to be seen by ambulance. He does not drive or have a person to help him out. Daughters not active in his care. He knows to call the office at [phone] if he has any questions or concerns.

## 2023-10-18 ENCOUNTER — Ambulatory Visit: Payer: Medicare (Managed Care) | Admitting: Physician Assistant

## 2023-10-18 ENCOUNTER — Telehealth: Payer: Self-pay | Admitting: Physician Assistant

## 2023-10-18 ENCOUNTER — Telehealth: Payer: Self-pay | Admitting: *Deleted

## 2023-10-18 NOTE — Telephone Encounter (Signed)
 Called Todd [name] to follow up on his fluid intake per message from Dr Tina below. He says he is not getting in enough fluids. Message to get him scheduled Thursday or Friday   He does need to increase fluid. We can have him see symptom manangement in the coming days and IVF.

## 2023-10-18 NOTE — Telephone Encounter (Signed)
 Called the patient and confirmed that an 8:30 am appointment would be okay once transportation is set up. Contacted [facility]ilsaint to reach out to the patient to set up transportation.

## 2023-10-20 ENCOUNTER — Other Ambulatory Visit: Payer: Self-pay

## 2023-10-20 DIAGNOSIS — C679 Malignant neoplasm of bladder, unspecified: Secondary | ICD-10-CM

## 2023-10-20 NOTE — Progress Notes (Signed)
 Symptom Management Consult Note [facility]    Patient Care Team: System, Provider Not In as PCP - General    Name / MRN / DOB: Todd [name]  [id number]  April 16, 1955   Date of visit: 10/21/2023   Chief Complaint/Reason for visit: decreased fluid intake   Current Therapy: Nivolumab   Last treatment:  Day 1   Cycle 1 on 10/13/23    ASSESSMENT AND PLAN Patient is a 68 y.o. 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 Tina.  I have viewed most recent oncology note and lab work.  #Bladder cancer metastatic to intrapelvic lymph nodes - Next appointment with oncologist is 10/27/23 - Patient received emotional support form chaplain Olam while in the infusion center.  #Decreased PO intake - CMP today showing creatinine slightly elevated at 1.75, was 1.53 x 8 days ago.  - Patient received 1 L NS in infusion center today for hydration support. Encouraged him to increase fluid intake at home as well. Will attempt to provide patient with a food bag from social work while here.  #Constipation - Benign abdominal exam. - Discussed OTC management.   #Cancer related pain - Administered home dose of oxycodone . Pant due for pain medication while here and did not medication with him. He admits pain is controlled on current regimen.  Strict ED precautions discussed should symptoms worsen.   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  2016-2018 He then had multiple other TURBTs with intermittent adherence/tolerability to BCG     05/27/2014 Procedure   Repeat TURBT, path again with LgTa. Only completed 2/6 BCG treatments, then lost to follow-up.    06/09/2015 Procedure   Repeat TURBT with 3 papillary tumors, pathology with LgTa and CIS.     06/2015 Imaging   CT A/P with diffuse bladder wall thickening, no definitive evidence of metastatic disease    07/21/2015 Procedure   Repeat TURBT with path showing high grade dysplasia. Again scheduled for BCG but only completed 1-2 doses.    10/19/2016 Procedure   Repeat TURBT with low grade papillary urothelial carcinoma/CIS. Received 4/6 doses of BCG. Lost to follow-up.    03/09/2019 Imaging   CT CAP with LUL 2cm GGO, minimal right urinary bladder wall thickening of 4mm    01/22/2020 Imaging   CT A/P in the ED for N/V/D/weight loss. Bladder thickening more pronounced at 7mm.    06-26-2020 Imaging   CT urogram for hematuria showed new right lateral masslike thickening of bladder wall, measure 8mm. No evidence metastatic disease.    08/13/2020 Procedure   Repeat TURBT with invasive high-grade urothelial carcinoma with nonfocal invasion into lamina propria (CIS present), second specimen with invasive high-grade urothelial carcinoma extensively involving lamina propria, with one fragment showing involvement of muscle bundles which are equivocal for muscularis mucosae or muscularis propria (CIS again present).    09/19/2020 Procedure   Repeat TURBT for re-resection with invasive high-grade urothelial carcinoma with non-focal lamina propria invasion and CIS, no involvement of muscularis propria    11/20/2020 - 09/17/2021 Chemotherapy   C1 - 8 Pembrolizumab    12/05/2022 Imaging   MRI pelvis wo contrast -Increased size of right posterolateral bladder wall mass, now measuring up to 3.2 cm, previously 1.2 cm, with extramural extension of the mass encasing the  distal right ureter and resulting in upstream right ureteral dilation. The extravesicular extension of the bladder mass also is in close contact with the vas deferens near the seminal vesicles for which abutment or invasion cannot be excluded.     01/03/2023 Imaging   CT AP -Increasing right posterior lateral bladder wall thickening,  concerning for disease progression. New right ureteral obstruction associated hydroureteronephrosis.  -Multiple subcentimeter hypoattenuating foci within the liver, favored to be benign given stability since 2016.   CT Chest: No intrathoracic metastasis with stable findings since December 28.     01/17/2023 PET scan   PET from [facility] - Thickening of the right posterolateral bladder wall, similar to prior CT dated 01/03/2023.  - Minimally FDG avid bilateral inguinal lymph nodes which do not appear pathologically enlarged or abnormal in appearance. These are favored to be reactive.  -There is a possible mildly hypermetabolic nodule along the posterior wall of the rectum which is not completely characterized on this study. Suggest correlation with colonoscopy, which patient has scheduled 01/20/2023.    02/07/2023 Pathology Results   A: Ureter margin, right distal, biopsy - Benign ureter with chronic inflammation - Negative for malignancy   B: Ureter margin, left distal, biopsy - Focal atypia, worrisome for focal involvement by urothelial carcinoma in situ   C: Bladder and prostate, cystoprostatectomy   BLADDER - Multifocal invasive high grade papillary urothelial carcinoma              - Dominant tumor is in right posterior upper bladder wall, 3.0 cm, invasive through muscularis propria with macroscopic invasion of peri-vesicle adipose tissue              - Satellite tumor in left bladder extending into left ureter, 0.9 cm, invasive into lamina propria - Extensive multifocal high grade papillary urothelial carcinoma and urothelial carcinoma in situ present in all random sections of the bladder mucosa (anterior, posterior, dome, trigone, right lateral, left lateral walls) away from invasive cancers - 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 (contiguous with carcinoma in situ of trigone), distal margin uninvolved - Margins of resection negative for invasive carcinoma - Left ureter  margin of this specimen shows urothelial carcinoma in situ, superceded by additional specimens, final left ureter margin is in specimen F, which shows patchy atypia 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 - See synoptic report    PROSTATE - 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 and involves prostatic ducts - Prostatic stroma with no evidence of involvement by urothelial carcinoma, negative for prostate carcinoma - Prostatic urethral distal margin negative   D: Lymph node, right pelvic, biopsy - Five lymph nodes with no evidence of malignancy (0/5)    E: Lymph node, left pelvic, biopsy - Metastatic carcinoma present in one of five lymph nodes (1/5), size of metastatic deposit 1.4 mm, without extranodal extension   F: True ureteral margin, left distal, biopsy - 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 - Negative for invasive carcinoma   G: True ureteral margin, right distal, biopsy - Benign ureter with chronic inflammation - Negative for malignancy  SPECIMEN    Procedure:    Radical cystoprostatectomy   TUMOR    Tumor Site:    Right lateral wall     Tumor Site:    Left lateral wall     Tumor Site:    Posterior wall     Histologic Type:    Urothelial carcinoma, invasive (conventional)  Histologic Grade:    High-grade     Tumor Size:    Greatest Dimension (Centimeters): 3.0 cm       Additional Dimension (Centimeters):    2.9 cm       Additional Dimension (Centimeters):    2.8 cm     Tumor Extent:    Invades perivesical soft tissue microscopically     Lymphatic and / or Vascular Invasion:    Not identified     Tumor Configuration:    Papillary     Tumor Configuration:    Solid / nodule     Tumor Configuration:    Flat     Treatment Effect Post Neoadjuvant Chemotherapy:    Weak and no response: residual cancer cells occupying greater than or equal to 50% of the tumor bed or absence of regressive changes (TRG3)     Tumor  Comment:    Appears to have received 1 dose of pembrolizumab, no apparent histologic response.   MARGINS    Margin Status for Invasive Tumor:    All margins negative for invasive tumor       Closest Margin(s) to Invasive tumor:    Soft tissue: right peri-vesicular margin       Distance from Invasive Tumor to Closest Margin:    13 mm     Margin Status for Carcinoma in Situ / Noninvasive Papillary Urothelial Carcinoma:    Carcinoma in situ / noninvasive papillary urothelial carcinoma present at margin       Margin(s) Involved by Carcinoma in Situ / Noninvasive Papillary Urothelial Carcinoma:    Left ureteral   REGIONAL LYMPH NODES     Regional Lymph Node Status:          :    Tumor present in regional lymph node(s)         Number of Lymph Nodes with Tumor:    1         Size of Largest Nodal Metastatic Deposit:    0.14 cm         Nodal Site with Largest Metastatic Deposit:    Left pelvic         Size of Largest Lymph Node with Tumor:    0.7 cm         Largest Lymph Node with Tumor:    left pelvic         Extranodal Extension (ENE):    Not identified       Number of Lymph Nodes Examined:    10   pTNM CLASSIFICATION (AJCC 8th Edition)     Reporting of pT, pN, and (when applicable) pM categories is based on information available to the pathologist at the time the report is issued. As per the AJCC (Chapter 1, 8th Ed.) it is the managing physician's responsibility to establish the final pathologic stage based upon all pertinent information, including but potentially not limited to this pathology report.     Modified Classification:    y     pT Category:    pT3a     T Suffix:    (m)     pN Category:    pN1    02/20/2023 Procedure   Colonoscopy Impression:    - The examined portion of the ileum was normal.                         - The entire examined colon is normal on direct and retroflexion views.                         -  Views were limited by liquid stool throughout the colon and difficulty  suctioning the stool.                         - No specimens collected.    02/23/2023 Imaging   CT AP Hepatobiliary: Hypodensities in liver consistent benign cysts. Postcholecystectomy. Mild extrahepatic biliary duct dilatation not changed from prior.   03/03/2023 -  Chemotherapy   First visit to [facility]. Initially planned for chemoradiation. Due to decrease PS and recurrent hospitalization, did not pursue chemotherapy. Discussed pending PS to consider adjuvant nivolumab  after radiation.     04/06/2023 Imaging   CT [facility] Relative hypoenhancement of the right kidney when compared to the left which is nonspecific. This could be related to altered perfusion or infection/pyelonephritis. No significant hydronephrosis is identified. Clinical correlation is recommended.  LIVER: Normal liver contour. Subcentimeter hypoattenuating hepatic foci too small to characterize further on CT.    05/09/2023 - 06/10/2023 Radiation Therapy   Adjuvant IMRT to bladder 45 Gy/ 25 fxs   05/10/2023 Cancer Staging   Staging form: Urinary Bladder, AJCC 8th Edition - Clinical: Stage IIIA (cT3, cN1, cM0) - Signed by Tina Pauletta BROCKS, MD on 05/10/2023 WHO/ISUP grade (low/high): High Grade Histologic grading system: 2 grade system   05/19/2023 Imaging   Presented to ED with abdominal pain CT AP: IMPRESSION: 1. Surgical resection of the bladder and prostate with ileal conduit present in the right lower quadrant. 2. No evidence of metastatic disease. 3. Mild delay of nephrogram on the right without hydronephrosis, possibly renovascular disease. 4. Aortic atherosclerosis.  CT chest: no metastases   08/23/2023 Imaging   CT AP IMPRESSION: Postsurgical changes as described above.   No acute abnormality is noted   09/12/2023 Imaging   CT chest: NED   10/13/2023 -  Chemotherapy   Patient is on Treatment Plan : BLADDER Nivolumab  (240) q14d         INTERVAL HISTORY  Discussed the use of AI scribe  software for clinical note transcription with the patient, who gave verbal consent to proceed.    Todd [name]. is a 68 y.o. male with oncologic history as above presenting to [facility] today with chief complaint of decreased PO intake. Patient presents unaccompanied to visit today.  He is consuming approximately 24 ounces of fluid per day, which is less than usual, and prefers drinking Sprite. No nausea, vomiting, or fevers are present.  He admits to feeling unhappy, attributing this to his treatment and overall condition. He has a lack of appetite and is reluctant to use the microwave in his current living arrangement. He desires his own place and is connected with social workers here at the [facility] [employer] already.  He experiences abdominal pain and has a history of constipation, for which he has previously used Miralax . He cannot remember if he is taking anything to help his constipation right now. He admits to passing flatus.    ROS  All other systems are reviewed and are negative for acute change except as noted in the HPI.    Allergies  Allergen Reactions   Clindamycin /Lincomycin Hives, Itching, Rash and Other (See Comments)    Skin peeled off hands   Milk-Related Compounds Nausea And Vomiting and Other (See Comments)    Lactose intolerant   Hydrocodone Itching and Other (See Comments)    feet and hands itch     Past Medical History:  Diagnosis Date  Allergy    Anxiety    Arthritis    Asthma    Bladder cancer (HCC) dx'd 10/2013   recurrent   Chronic gastritis    Chronic low back pain    Colon cancer (HCC) dx'd 09/2011   COPD (chronic obstructive pulmonary disease) (HCC)    Depression    Diverticulosis    Dysuria    GERD (gastroesophageal reflux disease)    History of acute pancreatitis    due to SIRS   History of adenomatous polyp of colon    04-04-2015  last colonoscopy w/ multiple polyps--  tubular adenomas, beign polypoid colonic mucous   History of bladder  cancer monitory by  dr Ceil   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   History of colon cancer oncologist-- dr cloretta--  dx  with Hereditary non-polyposis colon cancer syndrome,  MLH1 gene mutation--     dx june 2013--  stage II, T3,N0--  s/p  transverse colectomy--  completed 2 cycles  chemotherapy--  NO RECURRENCE in clinical remission   Hypertension    Lactose intolerance    Lower urinary tract symptoms (LUTS)    Lynch syndrome    MLH1 gene mutation    Short of breath on exertion    Wears glasses      Past Surgical History:  Procedure Laterality Date   CHOLECYSTECTOMY  2013   COLON SURGERY  06/ 2013   Transverse   COLONOSCOPY N/A 12/04/2013   Procedure: COLONOSCOPY;  Surgeon: Lamar JONETTA Aho, MD;  Location: [facility];  Service: Endoscopy;  Laterality: N/A;   COLONOSCOPY WITH ESOPHAGOGASTRODUODENOSCOPY (EGD)  last one 04-04-2015   polypecotmy's gastric and colon    CYSTOSCOPY W/ RETROGRADES Bilateral 06/06/2015   Procedure: BILATERAL  RETROGRADE PYELOGRAM;  Surgeon: Mark Ottelin, MD;  Location: [facility];  Service: Urology;  Laterality: Bilateral;   ERCP N/A 01/01/2014   Procedure: ENDOSCOPIC RETROGRADE CHOLANGIOPANCREATOGRAPHY (ERCP);  Surgeon: Lamar JONETTA Aho, MD;  Location: [facility];  Service: Endoscopy;  Laterality: N/A;  with spyglass - preprocedure ATB's   LUMBAR DISC SURGERY  x4   last one 2001   fusion L5 -- S1   SPYGLASS CHOLANGIOSCOPY N/A 01/01/2014   Procedure: SPYGLASS CHOLANGIOSCOPY;  Surgeon: Lamar JONETTA Aho, MD;  Location: [facility];  Service: Endoscopy;  Laterality: N/A;   TONSILLECTOMY  age 62   TRANSURETHRAL RESECTION OF BLADDER TUMOR WITH GYRUS (TURBT-GYRUS) N/A 10/26/2013   Procedure: TRANSURETHRAL RESECTION OF BLADDER TUMOR WITH GYRUS (TURBT-GYRUS) ;  Surgeon: Mark C Ottelin, MD;  Location: [facility];  Service: Urology;  Laterality: N/A;   TRANSURETHRAL RESECTION OF BLADDER TUMOR WITH GYRUS (TURBT-GYRUS) N/A 05/27/2014    Procedure: TRANSURETHRAL RESECTION OF BLADDER TUMOR  AND TRANSURETHERAL BIOPSY OF PROSTATE;  Surgeon: Mark C Ottelin, MD;  Location: [facility];  Service: Urology;  Laterality: N/A;   TRANSURETHRAL RESECTION OF BLADDER TUMOR WITH GYRUS (TURBT-GYRUS) N/A 06/06/2015   Procedure: TRANSURETHRAL RESECTION OF BLADDER TUMOR WITH GYRUS (TURBT-GYRUS);  Surgeon: Mark Ottelin, MD;  Location: [facility];  Service: Urology;  Laterality: N/A;    Social History   Socioeconomic History   Marital status: Single    Spouse name: Not on file   Number of children: 6   Years of education: Not on file   Highest education level: Not on file  Occupational History   Occupation: disabled  Tobacco Use   Smoking status: Every Day    Current packs/day: 0.50    Average packs/day:  0.5 packs/day for 20.0 years (10.0 ttl pk-yrs)    Types: Cigarettes, E-cigarettes   Smokeless tobacco: Never   Tobacco comments:    hx 1PPD smoker quit jan 2015 for 20 yrs.  Pt has cut back to 1 pk /wk  Vaping Use   Vaping status: Never Used  Substance and Sexual Activity   Alcohol use: No   Drug use: No   Sexual activity: Not Currently  Other Topics Concern   Not on file  Social History Narrative   ** Merged History Encounter **       Social Drivers of Health   Financial Resource Strain: Medium Risk (06/25/2023)   Received from [facility]   Overall Financial Resource Strain (CARDIA)    Difficulty of Paying Living Expenses: Somewhat hard  Food Insecurity: Food Insecurity Present (06/03/2023)   Hunger Vital Sign    Worried About Running Out of Food in the Last Year: Sometimes true    Ran Out of Food in the Last Year: Sometimes true  Transportation Needs: No Transportation Needs (10/12/2023)   PRAPARE - Administrator, Civil Service (Medical): No    Lack of Transportation (Non-Medical): No  Physical Activity: Inactive (12/28/2021)   Received from [facility]   Exercise Vital  Sign    On average, how many days per week do you engage in moderate to strenuous exercise (like a brisk walk)?: 0 days    On average, how many minutes do you engage in exercise at this level?: 0 min  Stress: Stress Concern Present (06/25/2023)   Received from [facility] - Occupational Stress Questionnaire    Feeling of Stress : Very much  Social Connections: Moderately Isolated (12/28/2021)   Received from [facility]   Social Connection and Isolation Panel    In a typical week, how many times do you talk on the phone with family, friends, or neighbors?: More than three times a week    How often do you get together with friends or relatives?: More than three times a week    How often do you attend church or religious services?: Never    Do you belong to any clubs or organizations such as church groups, unions, fraternal or athletic groups, or school groups?: Yes    How often do you attend meetings of the clubs or organizations you belong to?: Never    Are you married, widowed, divorced, separated, never married, or living with a partner?: Divorced  Intimate Partner Violence: Not At Risk (03/14/2023)   Humiliation, Afraid, Rape, and Kick questionnaire    Fear of Current or Ex-Partner: No    Emotionally Abused: No    Physically Abused: No    Sexually Abused: No    Family History  Problem Relation Age of Onset   Colon cancer Brother        x2   Rectal cancer Brother    Colon cancer Sister    Colon cancer Maternal Uncle    Colon cancer Cousin        x 2   Breast cancer Maternal Grandmother    Colon polyps Daughter        x 3   Diabetes Father    Stroke Father    Esophageal cancer Neg Hx    Liver cancer Neg Hx    Pancreatic cancer Neg Hx    Stomach cancer Neg Hx      Current Outpatient  Medications:    acetaminophen  (TYLENOL ) 500 MG tablet, Take 1,000 mg by mouth in the morning, at noon, and at bedtime., Disp: , Rfl:     albuterol (PROVENTIL HFA;VENTOLIN HFA) 108 (90 BASE) MCG/ACT inhaler, Inhale 1 puff into the lungs every 6 (six) hours as needed for wheezing or shortness of breath., Disp: , Rfl:    aspirin 81 MG chewable tablet, Chew 81 mg by mouth daily., Disp: , Rfl:    atorvastatin (LIPITOR) 80 MG tablet, Take 1 tablet by mouth daily., Disp: , Rfl:    bisacodyl (DULCOLAX) 10 MG suppository, Place 10 mg rectally daily as needed for moderate constipation., Disp: , Rfl:    budesonide-formoterol (SYMBICORT) 160-4.5 MCG/ACT inhaler, Inhale 2 puffs into the lungs 2 (two) times daily as needed (for respiratory flares). (Patient not taking: Reported on 08/23/2023), Disp: , Rfl:    clonazePAM  (KLONOPIN ) 1 MG tablet, Take 1 tablet (1 mg total) by mouth 3 (three) times daily as needed for anxiety. (Patient not taking: Reported on 08/23/2023), Disp: 30 tablet, Rfl: 0   cyanocobalamin  (VITAMIN B12) 1000 MCG tablet, Take 1,000 mcg by mouth daily., Disp: , Rfl:    diclofenac Sodium (VOLTAREN) 1 % GEL, Apply 2 g topically 4 (four) times daily as needed (pain)., Disp: , Rfl:    docusate sodium  (COLACE) 100 MG capsule, Take 1 capsule (100 mg total) by mouth 2 (two) times daily as needed for mild constipation., Disp: 60 capsule, Rfl: 1   DULoxetine (CYMBALTA) 60 MG capsule, Take 1 capsule by mouth daily., Disp: , Rfl:    gabapentin  (NEURONTIN ) 100 MG capsule, Take 100 mg by mouth at bedtime. Bedtime dose = 400 mg, Disp: , Rfl:    gabapentin  (NEURONTIN ) 300 MG capsule, Take 300 mg by mouth 3 (three) times daily., Disp: , Rfl:    lidocaine  (LIDODERM ) 5 %, Place 1 patch onto the skin daily. Remove & Discard patch within 12 hours or as directed by MD, Disp: , Rfl:    melatonin 3 MG TABS tablet, Take 3 mg by mouth at bedtime as needed (sleep)., Disp: , Rfl:    mirtazapine (REMERON) 7.5 MG tablet, Take 7.5 mg by mouth at bedtime., Disp: , Rfl:    morphine  (AVINZA ) 30 MG 24 hr capsule, Take 30 mg by mouth in the morning and at bedtime.,  Disp: , Rfl:    Multiple Vitamin (MULTIVITAMIN WITH MINERALS) TABS tablet, Take 1 tablet by mouth daily., Disp: , Rfl:    Oxycodone  HCl 10 MG TABS, Take 20 mg by mouth every 4 (four) hours as needed (moderate to severe pain)., Disp: , Rfl:    pantoprazole  (PROTONIX ) 40 MG tablet, Take 1 tablet (40 mg total) by mouth 2 (two) times daily. (Patient taking differently: Take 40 mg by mouth daily.), Disp: 180 tablet, Rfl: 0   polyethylene glycol (MIRALAX  / GLYCOLAX ) 17 g packet, Take 17 g by mouth daily., Disp: , Rfl:    tiZANidine (ZANAFLEX) 2 MG tablet, Take 2 mg by mouth every 6 (six) hours as needed for muscle spasms., Disp: , Rfl:    valsartan  (DIOVAN ) 160 MG tablet, Take 160 mg by mouth daily., Disp: , Rfl:    valsartan -hydrochlorothiazide  (DIOVAN -HCT) 160-12.5 MG tablet, Take 1 tablet by mouth daily. (Patient not taking: Reported on 08/23/2023), Disp: , Rfl:  No current facility-administered medications for this visit.  Facility-Administered Medications Ordered in Other Visits:    0.9 %  sodium chloride  infusion, , Intravenous, Once, Walisiewicz, Yonael Tulloch E, PA-C, Last Rate:  500 mL/hr at 10/21/23 1000, New Bag at 10/21/23 1000  PHYSICAL EXAM ECOG FS:1 - Symptomatic but completely ambulatory    Vitals:   10/21/23 0851  BP: 137/70  Pulse: 94  Resp: 16  Temp: 98.5 F (36.9 C)  TempSrc: Oral  SpO2: 95%  Weight: 185 lb (83.9 kg)   Physical Exam Vitals and nursing note reviewed.  Constitutional:      Appearance: He is not ill-appearing or toxic-appearing.  HENT:     Head: Normocephalic.  Eyes:     Conjunctiva/sclera: Conjunctivae normal.  Cardiovascular:     Rate and Rhythm: Normal rate and regular rhythm.     Pulses: Normal pulses.     Heart sounds: Normal heart sounds.  Pulmonary:     Effort: Pulmonary effort is normal.     Breath sounds: Normal breath sounds.  Abdominal:     General: There is no distension.     Comments: Ostomy site without purulent drainage, erythema,  induration, palpable fluctuance  Musculoskeletal:     Cervical back: Normal range of motion.  Skin:    General: Skin is warm and dry.  Neurological:     Mental Status: He is alert.        LABORATORY DATA I have reviewed the data as listed    Latest Ref Rng & Units 10/21/2023    8:32 AM 10/13/2023    2:12 PM 09/20/2023    1:15 PM  CBC  WBC 4.0 - 10.5 K/uL 5.4  3.6  3.6   Hemoglobin 13.0 - 17.0 g/dL 86.7  85.4  86.8   Hematocrit 39.0 - 52.0 % 39.4  43.5  39.6   Platelets 150 - 400 K/uL 236  201  235         Latest Ref Rng & Units 10/21/2023    8:32 AM 10/13/2023    2:12 PM 09/20/2023    1:15 PM  CMP  Glucose 70 - 99 mg/dL 874  91  883   BUN 8 - 23 mg/dL 21  18  28    Creatinine 0.61 - 1.24 mg/dL 8.24  8.46  8.01   Sodium 135 - 145 mmol/L 141  141  139   Potassium 3.5 - 5.1 mmol/L 4.0  4.5  4.1   Chloride 98 - 111 mmol/L 108  107  106   CO2 22 - 32 mmol/L 27  29  25    Calcium 8.9 - 10.3 mg/dL 9.0  9.2  8.8   Total Protein 6.5 - 8.1 g/dL 7.0  7.0  7.0   Total Bilirubin 0.0 - 1.2 mg/dL 0.5  0.6  0.4   Alkaline Phos 38 - 126 U/L 68  58  50   AST 15 - 41 U/L 12  13  19    ALT 0 - 44 U/L 9  9  10         RADIOGRAPHIC STUDIES (from last 24 hours if applicable) I have personally reviewed the radiological images as listed and agreed with the findings in the report. No results found.      Visit Diagnosis: 1. Decreased oral intake   2. Cancer associated pain   3. 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   All questions were answered. The patient knows to call the clinic with any problems, questions or concerns. No barriers to learning was detected.  A total of more than 30 minutes were  spent on this encounter with face-to-face time and non-face-to-face time, including preparing to see the patient, ordering tests and/or medications, counseling the patient and coordination of care as  outlined above.    Thank you for allowing me to participate in the care of this patient.    Katilyn Miltenberger E  Walisiewicz, PA-C Department of Hematology/Oncology [facility] at [facility] Phone: [phone]  [phone]    10/21/2023 11:33 AM

## 2023-10-21 ENCOUNTER — Inpatient Hospital Stay (HOSPITAL_BASED_OUTPATIENT_CLINIC_OR_DEPARTMENT_OTHER): Payer: Medicare (Managed Care) | Admitting: Physician Assistant

## 2023-10-21 ENCOUNTER — Inpatient Hospital Stay: Payer: Medicare (Managed Care)

## 2023-10-21 ENCOUNTER — Encounter: Payer: Self-pay | Admitting: General Practice

## 2023-10-21 ENCOUNTER — Encounter: Payer: Self-pay | Admitting: *Deleted

## 2023-10-21 VITALS — BP 137/70 | HR 94 | Temp 98.5°F | Resp 16 | Wt 185.0 lb

## 2023-10-21 DIAGNOSIS — R638 Other symptoms and signs concerning food and fluid intake: Secondary | ICD-10-CM

## 2023-10-21 DIAGNOSIS — C679 Malignant neoplasm of bladder, unspecified: Secondary | ICD-10-CM

## 2023-10-21 DIAGNOSIS — C775 Secondary and unspecified malignant neoplasm of intrapelvic lymph nodes: Secondary | ICD-10-CM | POA: Diagnosis not present

## 2023-10-21 DIAGNOSIS — G893 Neoplasm related pain (acute) (chronic): Secondary | ICD-10-CM

## 2023-10-21 DIAGNOSIS — Z5112 Encounter for antineoplastic immunotherapy: Secondary | ICD-10-CM | POA: Diagnosis not present

## 2023-10-21 LAB — CMP (CANCER CENTER ONLY)
ALT: 9 U/L (ref 0–44)
AST: 12 U/L — ABNORMAL LOW (ref 15–41)
Albumin: 3.8 g/dL (ref 3.5–5.0)
Alkaline Phosphatase: 68 U/L (ref 38–126)
Anion gap: 6 (ref 5–15)
BUN: 21 mg/dL (ref 8–23)
CO2: 27 mmol/L (ref 22–32)
Calcium: 9 mg/dL (ref 8.9–10.3)
Chloride: 108 mmol/L (ref 98–111)
Creatinine: 1.75 mg/dL — ABNORMAL HIGH (ref 0.61–1.24)
GFR, Estimated: 42 mL/min — ABNORMAL LOW (ref >60)
Glucose, Bld: 125 mg/dL — ABNORMAL HIGH (ref 70–99)
Potassium: 4 mmol/L (ref 3.5–5.1)
Sodium: 141 mmol/L (ref 135–145)
Total Bilirubin: 0.5 mg/dL (ref 0.0–1.2)
Total Protein: 7 g/dL (ref 6.5–8.1)

## 2023-10-21 LAB — CBC WITH DIFFERENTIAL (CANCER CENTER ONLY)
Abs Immature Granulocytes: 0.02 K/uL (ref 0.00–0.07)
Basophils Absolute: 0 K/uL (ref 0.0–0.1)
Basophils Relative: 0 %
Eosinophils Absolute: 0.1 K/uL (ref 0.0–0.5)
Eosinophils Relative: 2 %
HCT: 39.4 % (ref 39.0–52.0)
Hemoglobin: 13.2 g/dL (ref 13.0–17.0)
Immature Granulocytes: 0 %
Lymphocytes Relative: 13 %
Lymphs Abs: 0.7 K/uL (ref 0.7–4.0)
MCH: 32.7 pg (ref 26.0–34.0)
MCHC: 33.5 g/dL (ref 30.0–36.0)
MCV: 97.5 fL (ref 80.0–100.0)
Monocytes Absolute: 0.5 K/uL (ref 0.1–1.0)
Monocytes Relative: 9 %
Neutro Abs: 4 K/uL (ref 1.7–7.7)
Neutrophils Relative %: 76 %
Platelet Count: 236 K/uL (ref 150–400)
RBC: 4.04 MIL/uL — ABNORMAL LOW (ref 4.22–5.81)
RDW: 12.8 % (ref 11.5–15.5)
WBC Count: 5.4 K/uL (ref 4.0–10.5)
nRBC: 0 % (ref 0.0–0.2)

## 2023-10-21 LAB — MAGNESIUM: Magnesium: 2.1 mg/dL (ref 1.7–2.4)

## 2023-10-21 MED ORDER — SODIUM CHLORIDE 0.9 % IV SOLN: Freq: Once | INTRAVENOUS | Status: AC

## 2023-10-21 MED ORDER — OXYCODONE HCL 5 MG PO TABS
20.0000 mg | ORAL_TABLET | Freq: Once | ORAL | Status: AC
  Administered 2023-10-21: 20 mg via ORAL
  Filled 2023-10-21: qty 4

## 2023-10-21 NOTE — Progress Notes (Signed)
 [facility] Spiritual Care Note  Met Mr Luckenbach in infusion per referral from Nursing for additional layer of support. He particularly identifies desire for help with needs that are beyond the scope of Spiritual Care, such as housing and financial concerns, and verbalizes frustration with chaplain's lack of practical resources for assistance. Worked to establish rapport and to identify what Spiritual Care can provide, including social, emotional, and spiritual support.   Mr Ptacek verbalized appreciation for food bag provided by [facility] via chaplain, per referral from PA. He welcomes a follow-up Spiritual Care visit at a future treatment.   10/21/23 1500  Spiritual Encounters  Type of Visit Initial  Care provided to: Patient  Referral source Nurse (RN/NT/LPN)  Reason for visit Routine spiritual support  Spiritual Framework  Presenting Themes Caregiving needs;Goals in life/care  Community/Connection Limited  Strengths direct communication about personal needs  Needs/Challenges/Barriers practical needs that exceed health system resources  Patient Stress Factors Loss of control;Financial concerns  Goals  Clinical Care Goals build rapport, provide social/emotional/spiritual support  Interventions  Spiritual Care Interventions Made Established relationship of care and support;Compassionate presence;Reflective listening;Normalization of emotions   Elia Olam Filiberto Fae, [facility] Pager [phone] Voicemail [phone]

## 2023-10-21 NOTE — Patient Instructions (Signed)

## 2023-10-24 ENCOUNTER — Telehealth: Payer: Self-pay

## 2023-10-24 NOTE — Telephone Encounter (Signed)
 [facility] CSW Progress Note  Clinical Social Worker attempted to contact patient by phone to follow-up on psychosocial needs as treatment begins.    Interventions: CSW unable to leave vm      Follow Up Plan:  No follow up scheduled at this time.    Lizbeth Sprague, LCSW Clinical Social Worker [facility]

## 2023-10-26 NOTE — Telephone Encounter (Signed)
 Upcoming Appt: Future Appointments  Date Time Provider Department Center  11/21/2023  3:00 PM [name] LABOR, MD [facility]    Disposition: Go to ED Now  Encounter Reason for Disposition: .  [1] Tooth pushed out of its normal position AND [2] interferes with normal bite or chewing   Is this a pediatric patient?  No   Any recent, relevant visit?  No  Date/location of recent visit? Not seen for this broken tooth  Any related medications? Yes   Name of medication and dose? About to take Oxycodone  in a half hour  Any relevant medical history?  Yes   What history? Hx colon and bladder cancer (has no bladder) - [facility] Hx head injury - 6/6 - seen at [facility]  Any interventions?  Yes   What has been done? (include related medication given, dose, and date/time) Morphine  - 15  mg - 7:30 am   [employer] Eligibility  Patient is eligible for [employer] (according to most recent zip code and insurance information)?  No    Encounter Initial Assessment: 1. MECHANISM: How did the injury happen?      Molar - Bit down on a piece of chicken 2. ONSET: When did the injury happen? (e.g., minutes or hours ago)      Last night 3. LOCATION: What part of the tooth is injured?      Molar, top tooth on the right side 4. APPEARANCE: What does the tooth look like?      He states it's rotted and black 5. BLEEDING: Is the mouth still bleeding? If Yes, ask: Is it difficult to stop?      No bleeding 6. PAIN: Is it painful? If Yes, ask: How bad is the pain? (Scale 0-10; or none, mild, moderate, severe)     7-8/10 7. TETANUS: For any breaks in the skin, ask: When was the last tetanus booster?     No break in his skin 8. OTHER SYMPTOMS: Do you have any other symptoms?      Face is swollen - upper right side; cheek is getting bigger; doesn't think he has fever 9. PREGNANCY: Is there any chance you are pregnant? When was  your last menstrual period?     N/a   Encounter Protocols Used: Tooth Injury-A-AH

## 2023-10-27 ENCOUNTER — Inpatient Hospital Stay: Payer: Medicare (Managed Care)

## 2023-10-28 ENCOUNTER — Telehealth: Payer: Self-pay

## 2023-10-28 NOTE — Telephone Encounter (Signed)
 [facility] CSW Progress Note  Clinical Child psychotherapist contacted patient to assessed psychosocial needs as patient begins treatment. Patient continues be housed at [facility] room locally. Patient reported no concerns with treatment. Patient's primary concern is urostomy bag supplies. CSW reviewed  notes and referred patient to the urologist.       Follow Up Plan:  No follow up scheduled.    Lizbeth Sprague, LCSW Clinical Social Worker [facility]

## 2023-11-05 ENCOUNTER — Other Ambulatory Visit: Payer: Self-pay

## 2023-11-07 NOTE — Progress Notes (Unsigned)
 [facility] OFFICE PROGRESS NOTE  Patient Care Team: System, Provider Not In as PCP - General  Todd [name] is a 68 y.o.male with history of colon cancer, MIBC, MLH1 mutation, COPD, back fusion with chronic back pain being follow up with for urothelial carcinoma.    Assessment & Plan   No orders of the defined types were placed in this encounter.    Todd [name], MD  INTERVAL HISTORY: Patient returns for follow-up.  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  2016-2018 He then had multiple other TURBTs with intermittent adherence/tolerability to BCG     05/27/2014 Procedure   Repeat TURBT, path again with LgTa. Only completed 2/6 BCG treatments, then lost to follow-up.    06/09/2015 Procedure   Repeat TURBT with 3 papillary tumors, pathology with LgTa and CIS.    06/2015 Imaging   CT A/P with diffuse bladder wall thickening, no definitive evidence of metastatic disease    07/21/2015 Procedure   Repeat TURBT with path showing high grade dysplasia. Again scheduled for BCG but only completed 1-2 doses.    10/19/2016 Procedure   Repeat TURBT with low grade papillary urothelial carcinoma/CIS. Received 4/6 doses of BCG. Lost to follow-up.    03/09/2019 Imaging   CT CAP with LUL 2cm GGO, minimal right urinary bladder wall thickening of 4mm    01/22/2020 Imaging   CT A/P in the ED for N/V/D/weight loss. Bladder thickening more pronounced at 7mm.    07/01/2020 Imaging   CT urogram for hematuria showed new right lateral masslike thickening of bladder wall, measure 8mm. No evidence metastatic disease.    08/13/2020 Procedure   Repeat TURBT with invasive high-grade urothelial carcinoma with nonfocal invasion into lamina propria (CIS present), second specimen with invasive high-grade urothelial carcinoma  extensively involving lamina propria, with one fragment showing involvement of muscle bundles which are equivocal for muscularis mucosae or muscularis propria (CIS again present).    09/19/2020 Procedure   Repeat TURBT for re-resection with invasive high-grade urothelial carcinoma with non-focal lamina propria invasion and CIS, no involvement of muscularis propria    11/20/2020 - 09/17/2021 Chemotherapy   C1 - 8 Pembrolizumab    12/05/2022 Imaging   MRI pelvis wo contrast -Increased size of right posterolateral bladder wall mass, now measuring up to 3.2 cm, previously 1.2 cm, with extramural extension of the mass encasing the distal right ureter and resulting in upstream right ureteral dilation. The extravesicular extension of the bladder mass also is in close contact with the vas deferens near the seminal vesicles for which abutment or invasion cannot be excluded.     01/03/2023 Imaging   CT AP -Increasing right posterior lateral bladder wall thickening, concerning for disease progression. New right ureteral obstruction associated hydroureteronephrosis.  -Multiple subcentimeter hypoattenuating foci within the liver, favored to be benign given stability since 2016.   CT Chest: No intrathoracic metastasis with stable findings since December 28.     01/17/2023 PET scan   PET from [facility] - Thickening of the right posterolateral bladder wall, similar to prior CT dated 01/03/2023.  - Minimally FDG avid bilateral inguinal lymph nodes which do not appear pathologically enlarged or abnormal in appearance. These are favored to be reactive.  -There is a possible mildly hypermetabolic nodule along the posterior wall of the rectum which is not completely characterized on this  study. Suggest correlation with colonoscopy, which patient has scheduled 01/20/2023.    02/07/2023 Pathology Results   A: Ureter margin, right distal, biopsy - Benign ureter with chronic inflammation - Negative for malignancy   B:  Ureter margin, left distal, biopsy - Focal atypia, worrisome for focal involvement by urothelial carcinoma in situ   C: Bladder and prostate, cystoprostatectomy   BLADDER - Multifocal invasive high grade papillary urothelial carcinoma              - Dominant tumor is in right posterior upper bladder wall, 3.0 cm, invasive through muscularis propria with macroscopic invasion of peri-vesicle adipose tissue              - Satellite tumor in left bladder extending into left ureter, 0.9 cm, invasive into lamina propria - Extensive multifocal high grade papillary urothelial carcinoma and urothelial carcinoma in situ present in all random sections of the bladder mucosa (anterior, posterior, dome, trigone, right lateral, left lateral walls) away from invasive cancers - 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 (contiguous with carcinoma in situ of trigone), distal margin uninvolved - Margins of resection negative for invasive carcinoma - Left ureter margin of this specimen shows urothelial carcinoma in situ, superceded by additional specimens, final left ureter margin is in specimen F, which shows patchy atypia 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 - See synoptic report    PROSTATE - 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 and involves prostatic ducts - Prostatic stroma with no evidence of involvement by urothelial carcinoma, negative for prostate carcinoma - Prostatic urethral distal margin negative   D: Lymph node, right pelvic, biopsy - Five lymph nodes with no evidence of malignancy (0/5)    E: Lymph node, left pelvic, biopsy - Metastatic carcinoma present in one of five lymph nodes (1/5), size of metastatic deposit 1.4 mm, without extranodal extension   F: True ureteral margin, left distal, biopsy - 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 - Negative for invasive carcinoma   G: True ureteral margin, right distal,  biopsy - Benign ureter with chronic inflammation - Negative for malignancy  SPECIMEN    Procedure:    Radical cystoprostatectomy   TUMOR    Tumor Site:    Right lateral wall     Tumor Site:    Left lateral wall     Tumor Site:    Posterior wall     Histologic Type:    Urothelial carcinoma, invasive (conventional)     Histologic Grade:    High-grade     Tumor Size:    Greatest Dimension (Centimeters): 3.0 cm       Additional Dimension (Centimeters):    2.9 cm       Additional Dimension (Centimeters):    2.8 cm     Tumor Extent:    Invades perivesical soft tissue microscopically     Lymphatic and / or Vascular Invasion:    Not identified     Tumor Configuration:    Papillary     Tumor Configuration:    Solid / nodule     Tumor Configuration:    Flat     Treatment Effect Post Neoadjuvant Chemotherapy:    Weak and no response: residual cancer cells occupying greater than or equal to 50% of the tumor bed or absence of regressive changes (TRG3)     Tumor Comment:    Appears to have received 1 dose of pembrolizumab, no apparent histologic response.  MARGINS    Margin Status for Invasive Tumor:    All margins negative for invasive tumor       Closest Margin(s) to Invasive tumor:    Soft tissue: right peri-vesicular margin       Distance from Invasive Tumor to Closest Margin:    13 mm     Margin Status for Carcinoma in Situ / Noninvasive Papillary Urothelial Carcinoma:    Carcinoma in situ / noninvasive papillary urothelial carcinoma present at margin       Margin(s) Involved by Carcinoma in Situ / Noninvasive Papillary Urothelial Carcinoma:    Left ureteral   REGIONAL LYMPH NODES     Regional Lymph Node Status:          :    Tumor present in regional lymph node(s)         Number of Lymph Nodes with Tumor:    1         Size of Largest Nodal Metastatic Deposit:    0.14 cm         Nodal Site with Largest Metastatic Deposit:    Left pelvic         Size of Largest Lymph Node with Tumor:     0.7 cm         Largest Lymph Node with Tumor:    left pelvic         Extranodal Extension (ENE):    Not identified       Number of Lymph Nodes Examined:    10   pTNM CLASSIFICATION (AJCC 8th Edition)     Reporting of pT, pN, and (when applicable) pM categories is based on information available to the pathologist at the time the report is issued. As per the AJCC (Chapter 1, 8th Ed.) it is the managing physician's responsibility to establish the final pathologic stage based upon all pertinent information, including but potentially not limited to this pathology report.     Modified Classification:    y     pT Category:    pT3a     T Suffix:    (m)     pN Category:    pN1    02/20/2023 Procedure   Colonoscopy Impression:    - The examined portion of the ileum was normal.                         - The entire examined colon is normal on direct and retroflexion views.                         - Views were limited by liquid stool throughout the colon and difficulty suctioning the stool.                         - No specimens collected.    02/23/2023 Imaging   CT AP Hepatobiliary: Hypodensities in liver consistent benign cysts. Postcholecystectomy. Mild extrahepatic biliary duct dilatation not changed from prior.   03/03/2023 -  Chemotherapy   First visit to [facility] onc. Initially planned for chemoradiation. Due to decrease PS and recurrent hospitalization, did not pursue chemotherapy. Discussed pending PS to consider adjuvant nivolumab  after radiation.     04/06/2023 Imaging   CT AP from [facility] Relative hypoenhancement of the right kidney when compared to the left which is nonspecific. This could be related to altered perfusion or infection/pyelonephritis. No significant hydronephrosis  is identified. Clinical correlation is recommended.  LIVER: Normal liver contour. Subcentimeter hypoattenuating hepatic foci too small to characterize further on CT.    05/09/2023 - 06/10/2023 Radiation  Therapy   Adjuvant IMRT to bladder 45 Gy/ 25 fxs   05/10/2023 Cancer Staging   Staging form: Urinary Bladder, AJCC 8th Edition - Clinical: Stage IIIA (cT3, cN1, cM0) - Signed by Tina Todd BROCKS, MD on 05/10/2023 WHO/ISUP grade (low/high): High Grade Histologic grading system: 2 grade system   05/19/2023 Imaging   Presented to ED with abdominal pain CT AP: IMPRESSION: 1. Surgical resection of the bladder and prostate with ileal conduit present in the right lower quadrant. 2. No evidence of metastatic disease. 3. Mild delay of nephrogram on the right without hydronephrosis, possibly renovascular disease. 4. Aortic atherosclerosis.  CT chest: no metastases   08/23/2023 Imaging   CT AP IMPRESSION: Postsurgical changes as described above.   No acute abnormality is noted   09/12/2023 Imaging   CT chest: NED   10/13/2023 -  Chemotherapy   Patient is on Treatment Plan : BLADDER Nivolumab  (240) q14d        PHYSICAL EXAMINATION: ECOG PERFORMANCE STATUS: {CHL ONC ECOG PS:[phone]}  There were no vitals filed for this visit. There were no vitals filed for this visit.  GENERAL: alert, no distress and comfortable SKIN: skin color normal and no bruising or petechiae or jaundice on exposed skin EYES: normal, sclera clear OROPHARYNX: no exudate  NECK: No palpable mass LYMPH:  no palpable cervical, axillary lymphadenopathy  LUNGS: clear to auscultation and percussion with normal breathing effort HEART: regular rate & rhythm  ABDOMEN: abdomen soft, non-tender and nondistended. Musculoskeletal: no edema NEURO: no focal motor/sensory deficits  Relevant data reviewed during this visit included ***

## 2023-11-09 NOTE — Progress Notes (Signed)
 CBC with WBC less than 4. If patient worsens clinically, he needs to be re-evaluated in the ER.

## 2023-11-10 ENCOUNTER — Encounter

## 2023-11-10 ENCOUNTER — Inpatient Hospital Stay: Payer: Medicare (Managed Care)

## 2023-11-10 ENCOUNTER — Telehealth: Payer: Self-pay

## 2023-11-10 ENCOUNTER — Inpatient Hospital Stay (HOSPITAL_BASED_OUTPATIENT_CLINIC_OR_DEPARTMENT_OTHER): Payer: Medicare (Managed Care)

## 2023-11-10 VITALS — BP 139/80 | HR 81 | Temp 97.5°F | Resp 18 | Wt 185.1 lb

## 2023-11-10 DIAGNOSIS — Z936 Other artificial openings of urinary tract status: Secondary | ICD-10-CM | POA: Diagnosis not present

## 2023-11-10 DIAGNOSIS — N1832 Chronic kidney disease, stage 3b: Secondary | ICD-10-CM

## 2023-11-10 DIAGNOSIS — C679 Malignant neoplasm of bladder, unspecified: Secondary | ICD-10-CM

## 2023-11-10 DIAGNOSIS — R21 Rash and other nonspecific skin eruption: Secondary | ICD-10-CM | POA: Diagnosis not present

## 2023-11-10 DIAGNOSIS — C775 Secondary and unspecified malignant neoplasm of intrapelvic lymph nodes: Secondary | ICD-10-CM

## 2023-11-10 DIAGNOSIS — Z5112 Encounter for antineoplastic immunotherapy: Secondary | ICD-10-CM | POA: Diagnosis not present

## 2023-11-10 LAB — CBC WITH DIFFERENTIAL (CANCER CENTER ONLY)
Abs Immature Granulocytes: 0.01 K/uL (ref 0.00–0.07)
Basophils Absolute: 0 K/uL (ref 0.0–0.1)
Basophils Relative: 0 %
Eosinophils Absolute: 0 K/uL (ref 0.0–0.5)
Eosinophils Relative: 1 %
HCT: 41.5 % (ref 39.0–52.0)
Hemoglobin: 13.6 g/dL (ref 13.0–17.0)
Immature Granulocytes: 0 %
Lymphocytes Relative: 13 %
Lymphs Abs: 0.8 K/uL (ref 0.7–4.0)
MCH: 31.9 pg (ref 26.0–34.0)
MCHC: 32.8 g/dL (ref 30.0–36.0)
MCV: 97.2 fL (ref 80.0–100.0)
Monocytes Absolute: 0.7 K/uL (ref 0.1–1.0)
Monocytes Relative: 10 %
Neutro Abs: 4.8 K/uL (ref 1.7–7.7)
Neutrophils Relative %: 76 %
Platelet Count: 229 K/uL (ref 150–400)
RBC: 4.27 MIL/uL (ref 4.22–5.81)
RDW: 12.8 % (ref 11.5–15.5)
WBC Count: 6.3 K/uL (ref 4.0–10.5)
nRBC: 0 % (ref 0.0–0.2)

## 2023-11-10 LAB — CMP (CANCER CENTER ONLY)
ALT: 8 U/L (ref 0–44)
AST: 12 U/L — ABNORMAL LOW (ref 15–41)
Albumin: 3.8 g/dL (ref 3.5–5.0)
Alkaline Phosphatase: 58 U/L (ref 38–126)
Anion gap: 5 (ref 5–15)
BUN: 21 mg/dL (ref 8–23)
CO2: 28 mmol/L (ref 22–32)
Calcium: 9 mg/dL (ref 8.9–10.3)
Chloride: 108 mmol/L (ref 98–111)
Creatinine: 1.65 mg/dL — ABNORMAL HIGH (ref 0.61–1.24)
GFR, Estimated: 45 mL/min — ABNORMAL LOW (ref >60)
Glucose, Bld: 99 mg/dL (ref 70–99)
Potassium: 4.5 mmol/L (ref 3.5–5.1)
Sodium: 141 mmol/L (ref 135–145)
Total Bilirubin: 0.4 mg/dL (ref 0.0–1.2)
Total Protein: 7.2 g/dL (ref 6.5–8.1)

## 2023-11-10 LAB — T4, FREE: Free T4: 1.09 ng/dL (ref 0.61–1.12)

## 2023-11-10 LAB — TSH: TSH: 1.25 u[IU]/mL (ref 0.350–4.500)

## 2023-11-10 NOTE — Progress Notes (Signed)
 Patient arrived for treatment today and was cleared for his opdivo  by Dr. Tina. Patient is a very difficult IV stick and IV team had to be called. After waiting for quite a while the patient decided to go home and be rescheduled for treatment tomorrow. PAC placement was discussed and patient was interested. Dr. Tina was contacted and stated that he would order a [facility] for the patient.

## 2023-11-10 NOTE — Assessment & Plan Note (Addendum)
 Repeat B12 and MMA next month

## 2023-11-10 NOTE — Assessment & Plan Note (Addendum)
 Mild and improving Recommend 1% hydrocortisone twice daily

## 2023-11-10 NOTE — Assessment & Plan Note (Addendum)
 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 Discussed adjuvant therapy. He elected to proceed. Continue nivo, plan on 1 year as tolerated. Return every 2 weeks with lab, visit and then infusion Repeat CT about end of Oct

## 2023-11-10 NOTE — Assessment & Plan Note (Addendum)
 Fluid goal 60-70 oz per day Monitor lab each visit.

## 2023-11-10 NOTE — Addendum Note (Signed)
 Addended by: TINA HUDSON on: 11/10/2023 03:38 PM   Modules accepted: Orders

## 2023-11-11 ENCOUNTER — Inpatient Hospital Stay

## 2023-11-11 ENCOUNTER — Other Ambulatory Visit: Payer: Self-pay | Admitting: Radiology

## 2023-11-11 VITALS — BP 132/78 | HR 78 | Temp 98.2°F | Resp 18

## 2023-11-11 DIAGNOSIS — C678 Malignant neoplasm of overlapping sites of bladder: Secondary | ICD-10-CM | POA: Diagnosis present

## 2023-11-11 DIAGNOSIS — Z85038 Personal history of other malignant neoplasm of large intestine: Secondary | ICD-10-CM

## 2023-11-11 DIAGNOSIS — Z5112 Encounter for antineoplastic immunotherapy: Secondary | ICD-10-CM | POA: Diagnosis present

## 2023-11-11 DIAGNOSIS — C679 Malignant neoplasm of bladder, unspecified: Secondary | ICD-10-CM

## 2023-11-11 DIAGNOSIS — Z79620 Long term (current) use of immunosuppressive biologic: Secondary | ICD-10-CM | POA: Diagnosis not present

## 2023-11-11 DIAGNOSIS — C775 Secondary and unspecified malignant neoplasm of intrapelvic lymph nodes: Secondary | ICD-10-CM | POA: Insufficient documentation

## 2023-11-11 MED ORDER — SODIUM CHLORIDE 0.9 % IV SOLN
240.0000 mg | Freq: Once | INTRAVENOUS | Status: AC
  Administered 2023-11-11: 240 mg via INTRAVENOUS
  Filled 2023-11-11: qty 24

## 2023-11-11 MED ORDER — SODIUM CHLORIDE 0.9 % IV SOLN: INTRAVENOUS | Status: DC

## 2023-11-11 NOTE — Patient Instructions (Signed)
 [facility] - A DEPT OF [facility]  Discharge Instructions: Thank you for choosing [facility] to provide your oncology and hematology care.   If you have a lab appointment with the Cancer Center, please go directly to the Cancer Center and check in at the registration area.   Wear comfortable clothing and clothing appropriate for easy access to any Portacath or PICC line.   We strive to give you quality time with your provider. You may need to reschedule your appointment if you arrive late (15 or more minutes).  Arriving late affects you and other patients whose appointments are after yours.  Also, if you miss three or more appointments without notifying the office, you may be dismissed from the clinic at the provider's discretion.      For prescription refill requests, have your pharmacy contact our office and allow 72 hours for refills to be completed.    Today you received the following chemotherapy and/or immunotherapy agents: Opdivo (Nivolumab)      To help prevent nausea and vomiting after your treatment, we encourage you to take your nausea medication as directed.  BELOW ARE SYMPTOMS THAT SHOULD BE REPORTED IMMEDIATELY: *FEVER GREATER THAN 100.4 F (38 C) OR HIGHER *CHILLS OR SWEATING *NAUSEA AND VOMITING THAT IS NOT CONTROLLED WITH YOUR NAUSEA MEDICATION *UNUSUAL SHORTNESS OF BREATH *UNUSUAL BRUISING OR BLEEDING *URINARY PROBLEMS (pain or burning when urinating, or frequent urination) *BOWEL PROBLEMS (unusual diarrhea, constipation, pain near the anus) TENDERNESS IN MOUTH AND THROAT WITH OR WITHOUT PRESENCE OF ULCERS (sore throat, sores in mouth, or a toothache) UNUSUAL RASH, SWELLING OR PAIN  UNUSUAL VAGINAL DISCHARGE OR ITCHING   Items with * indicate a potential emergency and should be followed up as soon as possible or go to the Emergency Department if any problems should occur.  Please show the CHEMOTHERAPY ALERT CARD or  IMMUNOTHERAPY ALERT CARD at check-in to the Emergency Department and triage nurse.  Should you have questions after your visit or need to cancel or reschedule your appointment, please contact [facility] - A DEPT OF [facility]  Dept: [phone]  and follow the prompts.  Office hours are 8:00 a.m. to 4:30 p.m. Monday - Friday. Please note that voicemails left after 4:00 p.m. may not be returned until the following business day.  We are closed weekends and major holidays. You have access to a nurse at all times for urgent questions. Please call the main number to the clinic Dept: [phone] and follow the prompts.   For any non-urgent questions, you may also contact your provider using MyChart. We now offer e-Visits for anyone 91 and older to request care online for non-urgent symptoms. For details visit [email or web address].   Also download the MyChart app! Go to the app store, search MyChart, open the app, select [place], and log in with your MyChart username and password.  Nivolumab Injection What is this medication? NIVOLUMAB (nye VOL ue mab) treats some types of cancer. It works by helping your immune system slow or stop the spread of cancer cells. It is a monoclonal antibody. This medicine may be used for other purposes; ask your health care provider or pharmacist if you have questions. COMMON BRAND NAME(S): Opdivo What should I tell my care team before I take this medication? They need to know if you have any of these conditions: Allogeneic stem cell transplant (uses someone else's stem cells)  Autoimmune diseases, such as Crohn disease, ulcerative colitis, lupus History of chest radiation Nervous system problems, such as Guillain-Barre syndrome or myasthenia gravis Organ transplant An unusual or allergic reaction to nivolumab, other medications, foods, dyes, or preservatives Pregnant or trying to get pregnant Breast-feeding How should I use  this medication? This medication is infused into a vein. It is given in a hospital or clinic setting. A special MedGuide will be given to you before each treatment. Be sure to read this information carefully each time. Talk to your care team about the use of this medication in children. While it may be prescribed for children as young as 12 years for selected conditions, precautions do apply. Overdosage: If you think you have taken too much of this medicine contact a poison control center or emergency room at once. NOTE: This medicine is only for you. Do not share this medicine with others. What if I miss a dose? Keep appointments for follow-up doses. It is important not to miss your dose. Call your care team if you are unable to keep an appointment. What may interact with this medication? Interactions have not been studied. This list may not describe all possible interactions. Give your health care provider a list of all the medicines, herbs, non-prescription drugs, or dietary supplements you use. Also tell them if you smoke, drink alcohol, or use illegal drugs. Some items may interact with your medicine. What should I watch for while using this medication? Your condition will be monitored carefully while you are receiving this medication. You may need blood work while taking this medication. This medication may cause serious skin reactions. They can happen weeks to months after starting the medication. Contact your care team right away if you notice fevers or flu-like symptoms with a rash. The rash may be red or purple and then turn into blisters or peeling of the skin. You may also notice a red rash with swelling of the face, lips, or lymph nodes in your neck or under your arms. Tell your care team right away if you have any change in your eyesight. Talk to your care team if you are pregnant or think you might be pregnant. A negative pregnancy test is required before starting this medication. A  reliable form of contraception is recommended while taking this medication and for 5 months after the last dose. Talk to your care team about effective forms of contraception. Do not breast-feed while taking this medication and for 5 months after the last dose. What side effects may I notice from receiving this medication? Side effects that you should report to your care team as soon as possible: Allergic reactions--skin rash, itching, hives, swelling of the face, lips, tongue, or throat Dry cough, shortness of breath or trouble breathing Eye pain, redness, irritation, or discharge with blurry or decreased vision Heart muscle inflammation--unusual weakness or fatigue, shortness of breath, chest pain, fast or irregular heartbeat, dizziness, swelling of the ankles, feet, or hands Hormone gland problems--headache, sensitivity to light, unusual weakness or fatigue, dizziness, fast or irregular heartbeat, increased sensitivity to cold or heat, excessive sweating, constipation, hair loss, increased thirst or amount of urine, tremors or shaking, irritability Infusion reactions--chest pain, shortness of breath or trouble breathing, feeling faint or lightheaded Kidney injury (glomerulonephritis)--decrease in the amount of urine, red or dark brown urine, foamy or bubbly urine, swelling of the ankles, hands, or feet Liver injury--right upper belly pain, loss of appetite, nausea, light-colored stool, dark yellow or brown urine, yellowing skin  or eyes, unusual weakness or fatigue Pain, tingling, or numbness in the hands or feet, muscle weakness, change in vision, confusion or trouble speaking, loss of balance or coordination, trouble walking, seizures Rash, fever, and swollen lymph nodes Redness, blistering, peeling, or loosening of the skin, including inside the mouth Sudden or severe stomach pain, bloody diarrhea, fever, nausea, vomiting Side effects that usually do not require medical attention (report these  to your care team if they continue or are bothersome): Bone, joint, or muscle pain Diarrhea Fatigue Loss of appetite Nausea Skin rash This list may not describe all possible side effects. Call your doctor for medical advice about side effects. You may report side effects to FDA at 1-800-FDA-1088. Where should I keep my medication? This medication is given in a hospital or clinic. It will not be stored at home. NOTE: This sheet is a summary. It may not cover all possible information. If you have questions about this medicine, talk to your doctor, pharmacist, or health care provider.  2024 Elsevier/Gold Standard (2021-07-27 00:00:00)

## 2023-11-12 NOTE — H&P (Signed)
 Chief Complaint: Metastatic bladder cancer, poor venous access; referred for image guided port a cath placement to assist with treatment  Referring Provider(s): Chang,R  Supervising Physician: Philip Cornet  Patient Status: [facility] - Out-pt  History of Present Illness: Todd [name]. is a 68 y.o. male smoker with PMH sig for anxiety/depression, arthritis, chronic low back pain, COPD, remote colon cancer, diverticulosis, GERD, CKD, HTN, Lynch syndrome, and metastatic bladder cancer with prior cystoprostatectomy/ileal conduit. He has poor venous access and presents today for port a cath placement to assist with treatment.   *** Patient is Full Code  Past Medical History:  Diagnosis Date   Allergy    Anxiety    Arthritis    Asthma    Bladder cancer (HCC) dx'd 10/2013   recurrent   Chronic gastritis    Chronic low back pain    Colon cancer (HCC) dx'd 09/2011   COPD (chronic obstructive pulmonary disease) (HCC)    Depression    Diverticulosis    Dysuria    GERD (gastroesophageal reflux disease)    History of acute pancreatitis    due to SIRS   History of adenomatous polyp of colon    04-04-2015  last colonoscopy w/ multiple polyps--  tubular adenomas, beign polypoid colonic mucous   History of bladder cancer monitory by  dr Ceil   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   History of colon cancer oncologist-- dr cloretta--  dx  with Hereditary non-polyposis colon cancer syndrome,  MLH1 gene mutation--     dx june 2013--  stage II, T3,N0--  s/p  transverse colectomy--  completed 2 cycles  chemotherapy--  NO RECURRENCE in clinical remission   Hypertension    Lactose intolerance    Lower urinary tract symptoms (LUTS)    Lynch syndrome    MLH1 gene mutation    Short of breath on exertion    Wears glasses     Past Surgical History:  Procedure Laterality Date   CHOLECYSTECTOMY  2013   COLON SURGERY  06/ 2013   Transverse   COLONOSCOPY N/A 12/04/2013   Procedure:  COLONOSCOPY;  Surgeon: Lamar JONETTA Aho, MD;  Location: [facility];  Service: Endoscopy;  Laterality: N/A;   COLONOSCOPY WITH ESOPHAGOGASTRODUODENOSCOPY (EGD)  last one 04-04-2015   polypecotmy's gastric and colon    CYSTOSCOPY W/ RETROGRADES Bilateral 06/06/2015   Procedure: BILATERAL  RETROGRADE PYELOGRAM;  Surgeon: Mark Ottelin, MD;  Location: [facility];  Service: Urology;  Laterality: Bilateral;   ERCP N/A 01/01/2014   Procedure: ENDOSCOPIC RETROGRADE CHOLANGIOPANCREATOGRAPHY (ERCP);  Surgeon: Lamar JONETTA Aho, MD;  Location: [facility];  Service: Endoscopy;  Laterality: N/A;  with spyglass - preprocedure ATB's   LUMBAR DISC SURGERY  x4   last one 2001   fusion L5 -- S1   SPYGLASS CHOLANGIOSCOPY N/A 01/01/2014   Procedure: SPYGLASS CHOLANGIOSCOPY;  Surgeon: Lamar JONETTA Aho, MD;  Location: [facility];  Service: Endoscopy;  Laterality: N/A;   TONSILLECTOMY  age 54   TRANSURETHRAL RESECTION OF BLADDER TUMOR WITH GYRUS (TURBT-GYRUS) N/A 10/26/2013   Procedure: TRANSURETHRAL RESECTION OF BLADDER TUMOR WITH GYRUS (TURBT-GYRUS) ;  Surgeon: Mark C Ottelin, MD;  Location: [facility];  Service: Urology;  Laterality: N/A;   TRANSURETHRAL RESECTION OF BLADDER TUMOR WITH GYRUS (TURBT-GYRUS) N/A 05/27/2014   Procedure: TRANSURETHRAL RESECTION OF BLADDER TUMOR  AND TRANSURETHERAL BIOPSY OF PROSTATE;  Surgeon: Mark C Ottelin, MD;  Location: [facility];  Service: Urology;  Laterality: N/A;  TRANSURETHRAL RESECTION OF BLADDER TUMOR WITH GYRUS (TURBT-GYRUS) N/A 06/06/2015   Procedure: TRANSURETHRAL RESECTION OF BLADDER TUMOR WITH GYRUS (TURBT-GYRUS);  Surgeon: Mark Ottelin, MD;  Location: [facility];  Service: Urology;  Laterality: N/A;    Allergies: Clindamycin /lincomycin, Milk-related compounds, and Hydrocodone  Medications: Prior to Admission medications   Medication Sig Start Date End Date Taking? Authorizing Provider  acetaminophen  (TYLENOL ) 500 MG tablet  Take 1,000 mg by mouth in the morning, at noon, and at bedtime.    [provider]  albuterol (PROVENTIL HFA;VENTOLIN HFA) 108 (90 BASE) MCG/ACT inhaler Inhale 1 puff into the lungs every 6 (six) hours as needed for wheezing or shortness of breath.    [provider]  aspirin 81 MG chewable tablet Chew 81 mg by mouth daily. 06/30/23   [provider]  atorvastatin (LIPITOR) 80 MG tablet Take 1 tablet by mouth daily. 06/30/23   [provider]  bisacodyl (DULCOLAX) 10 MG suppository Place 10 mg rectally daily as needed for moderate constipation.    [provider]  budesonide-formoterol (SYMBICORT) 160-4.5 MCG/ACT inhaler Inhale 2 puffs into the lungs 2 (two) times daily as needed (for respiratory flares). Patient not taking: Reported on 08/23/2023    [provider]  clonazePAM  (KLONOPIN ) 1 MG tablet Take 1 tablet (1 mg total) by mouth 3 (three) times daily as needed for anxiety. Patient not taking: Reported on 08/23/2023 06/03/23   Patrcia Cough, MD  cyanocobalamin  (VITAMIN B12) 1000 MCG tablet Take 1,000 mcg by mouth daily.    [provider]  diclofenac Sodium (VOLTAREN) 1 % GEL Apply 2 g topically 4 (four) times daily as needed (pain). 05/05/23 05/04/24  [provider]  docusate sodium  (COLACE) 100 MG capsule Take 1 capsule (100 mg total) by mouth 2 (two) times daily as needed for mild constipation. 10/05/23   Tina Pauletta BROCKS, MD  DULoxetine (CYMBALTA) 60 MG capsule Take 1 capsule by mouth daily. 07/01/23   [provider]  gabapentin  (NEURONTIN ) 100 MG capsule Take 100 mg by mouth at bedtime. Bedtime dose = 400 mg    [provider]  gabapentin  (NEURONTIN ) 300 MG capsule Take 300 mg by mouth 3 (three) times daily. 06/30/23   [provider]  lidocaine  (LIDODERM ) 5 % Place 1 patch onto the skin daily. Remove & Discard patch within 12 hours or as directed by MD    [provider]  melatonin 3 MG  TABS tablet Take 3 mg by mouth at bedtime as needed (sleep). 06/30/23   [provider]  mirtazapine (REMERON) 7.5 MG tablet Take 7.5 mg by mouth at bedtime. 06/30/23   [provider]  morphine  (AVINZA ) 30 MG 24 hr capsule Take 30 mg by mouth in the morning and at bedtime.    [provider]  Multiple Vitamin (MULTIVITAMIN WITH MINERALS) TABS tablet Take 1 tablet by mouth daily.    [provider]  Oxycodone  HCl 10 MG TABS Take 20 mg by mouth every 4 (four) hours as needed (moderate to severe pain). 08/11/23   [provider]  pantoprazole  (PROTONIX ) 40 MG tablet Take 1 tablet (40 mg total) by mouth 2 (two) times daily. Patient taking differently: Take 40 mg by mouth daily. 05/21/19   Armbruster, Elspeth SQUIBB, MD  polyethylene glycol (MIRALAX  / GLYCOLAX ) 17 g packet Take 17 g by mouth daily.    [provider]  tiZANidine (ZANAFLEX) 2 MG tablet Take 2 mg by mouth every 6 (six) hours  as needed for muscle spasms. 06/30/23   [provider]  valsartan  (DIOVAN ) 160 MG tablet Take 160 mg by mouth daily.    [provider]  valsartan -hydrochlorothiazide  (DIOVAN -HCT) 160-12.5 MG tablet Take 1 tablet by mouth daily. Patient not taking: Reported on 08/23/2023    [provider]     Family History  Problem Relation Age of Onset   Colon cancer Brother        x2   Rectal cancer Brother    Colon cancer Sister    Colon cancer Maternal Uncle    Colon cancer Cousin        x 2   Breast cancer Maternal Grandmother    Colon polyps Daughter        x 3   Diabetes Father    Stroke Father    Esophageal cancer Neg Hx    Liver cancer Neg Hx    Pancreatic cancer Neg Hx    Stomach cancer Neg Hx     Social History   Socioeconomic History   Marital status: Single    Spouse name: Not on file   Number of children: 6   Years of education: Not on file   Highest education level: Not on file  Occupational History   Occupation: disabled   Tobacco Use   Smoking status: Every Day    Current packs/day: 0.50    Average packs/day: 0.5 packs/day for 20.0 years (10.0 ttl pk-yrs)    Types: Cigarettes, E-cigarettes   Smokeless tobacco: Never   Tobacco comments:    hx 1PPD smoker quit jan 2015 for 20 yrs.  Pt has cut back to 1 pk /wk  Vaping Use   Vaping status: Never Used  Substance and Sexual Activity   Alcohol use: No   Drug use: No   Sexual activity: Not Currently  Other Topics Concern   Not on file  Social History Narrative   ** Merged History Encounter **       Social Drivers of Health   Financial Resource Strain: Medium Risk (06/25/2023)   Received from [facility]   Overall Financial Resource Strain (CARDIA)    Difficulty of Paying Living Expenses: Somewhat hard  Food Insecurity: Food Insecurity Present (06/03/2023)   Hunger Vital Sign    Worried About Running Out of Food in the Last Year: Sometimes true    Ran Out of Food in the Last Year: Sometimes true  Transportation Needs: No Transportation Needs (10/12/2023)   PRAPARE - Administrator, Civil Service (Medical): No    Lack of Transportation (Non-Medical): No  Physical Activity: Inactive (12/28/2021)   Received from [facility]   Exercise Vital Sign    On average, how many days per week do you engage in moderate to strenuous exercise (like a brisk walk)?: 0 days    On average, how many minutes do you engage in exercise at this level?: 0 min  Stress: Stress Concern Present (06/25/2023)   Received from [facility] - Occupational Stress Questionnaire    Feeling of Stress : Very much  Social Connections: Moderately Isolated (12/28/2021)   Received from [facility]   Social Connection and Isolation Panel    In a typical week, how many times do you talk on the phone with family, friends, or neighbors?: More than three times a week    How often do you get together with friends or relatives?: More  than three times a week    How often do you attend church or religious services?: Never    Do you belong to any clubs or organizations such as church groups, unions, fraternal or athletic groups, or school groups?: Yes    How often do you attend meetings of the clubs or organizations you belong to?: Never    Are you married, widowed, divorced, separated, never married, or living with a partner?: Divorced      Review of Systems  Vital Signs:   Advance Care Plan: no documents on file   Physical Exam  Imaging: No results found.  Labs:  CBC: Recent Labs    09/20/23 1315 10/13/23 1412 10/21/23 0832 11/10/23 1316  WBC 3.6* 3.6* 5.4 6.3  HGB 13.1 14.5 13.2 13.6  HCT 39.6 43.5 39.4 41.5  PLT 235 201 236 229    COAGS: No results for input(s): INR, APTT in the last 8760 hours.  BMP: Recent Labs    09/20/23 1315 10/13/23 1412 10/21/23 0832 11/10/23 1316  NA 139 141 141 141  K 4.1 4.5 4.0 4.5  CL 106 107 108 108  CO2 25 29 27 28   GLUCOSE 116* 91 125* 99  BUN 28* 18 21 21   CALCIUM 8.8* 9.2 9.0 9.0  CREATININE 1.98* 1.53* 1.75* 1.65*  GFRNONAA 36* 50* 42* 45*    LIVER FUNCTION TESTS: Recent Labs    09/20/23 1315 10/13/23 1412 10/21/23 0832 11/10/23 1316  BILITOT 0.4 0.6 0.5 0.4  AST 19 13* 12* 12*  ALT 10 9 9 8   ALKPHOS 50 58 68 58  PROT 7.0 7.0 7.0 7.2  ALBUMIN 4.1 3.9 3.8 3.8    TUMOR MARKERS: No results for input(s): AFPTM, CEA, CA199, CHROMGRNA in the last 8760 hours.  Assessment and Plan: 68 y.o. male smoker with PMH sig for anxiety/depression, arthritis, chronic low back pain, COPD, remote colon cancer, diverticulosis, GERD, CKD, HTN, Lynch syndrome, and metastatic bladder cancer with prior cystoprostatectomy/ileal conduit. He has poor venous access and presents today for port a cath placement to assist with treatment. Risks and benefits of image guided port-a-catheter placement was discussed with the patient including, but not limited  to bleeding, infection, pneumothorax, or fibrin sheath development and need for additional procedures.  All of the patient's questions were answered, patient is agreeable to proceed. Consent signed and in chart.    Thank you for allowing our service to participate in Ruble Pumphrey. 's care.  Electronically Signed: D. Franky Rakers, PA-C   11/12/2023, 5:49 PM      I spent a total of 20 minutes    in face to face in clinical consultation, greater than 50% of which was counseling/coordinating care for port a cath placement

## 2023-11-13 ENCOUNTER — Other Ambulatory Visit: Payer: Self-pay | Admitting: Student

## 2023-11-14 ENCOUNTER — Other Ambulatory Visit: Payer: Self-pay

## 2023-11-14 ENCOUNTER — Ambulatory Visit (HOSPITAL_COMMUNITY)
Admission: RE | Admit: 2023-11-14 | Discharge: 2023-11-14 | Disposition: A | Discharge status: Home / Self Care | Source: Ambulatory Visit

## 2023-11-14 ENCOUNTER — Encounter (HOSPITAL_COMMUNITY): Payer: Self-pay

## 2023-11-14 DIAGNOSIS — C189 Malignant neoplasm of colon, unspecified: Secondary | ICD-10-CM | POA: Insufficient documentation

## 2023-11-14 DIAGNOSIS — Z85038 Personal history of other malignant neoplasm of large intestine: Secondary | ICD-10-CM

## 2023-11-14 DIAGNOSIS — F419 Anxiety disorder, unspecified: Secondary | ICD-10-CM | POA: Insufficient documentation

## 2023-11-14 DIAGNOSIS — G8929 Other chronic pain: Secondary | ICD-10-CM | POA: Diagnosis not present

## 2023-11-14 DIAGNOSIS — C775 Secondary and unspecified malignant neoplasm of intrapelvic lymph nodes: Secondary | ICD-10-CM | POA: Insufficient documentation

## 2023-11-14 DIAGNOSIS — N189 Chronic kidney disease, unspecified: Secondary | ICD-10-CM | POA: Insufficient documentation

## 2023-11-14 DIAGNOSIS — K219 Gastro-esophageal reflux disease without esophagitis: Secondary | ICD-10-CM | POA: Diagnosis not present

## 2023-11-14 DIAGNOSIS — I129 Hypertensive chronic kidney disease with stage 1 through stage 4 chronic kidney disease, or unspecified chronic kidney disease: Secondary | ICD-10-CM | POA: Diagnosis not present

## 2023-11-14 DIAGNOSIS — F32A Depression, unspecified: Secondary | ICD-10-CM | POA: Diagnosis not present

## 2023-11-14 DIAGNOSIS — F17210 Nicotine dependence, cigarettes, uncomplicated: Secondary | ICD-10-CM | POA: Insufficient documentation

## 2023-11-14 DIAGNOSIS — J4489 Other specified chronic obstructive pulmonary disease: Secondary | ICD-10-CM | POA: Insufficient documentation

## 2023-11-14 DIAGNOSIS — C679 Malignant neoplasm of bladder, unspecified: Secondary | ICD-10-CM | POA: Insufficient documentation

## 2023-11-14 DIAGNOSIS — Z1509 Genetic susceptibility to other malignant neoplasm: Secondary | ICD-10-CM | POA: Insufficient documentation

## 2023-11-14 HISTORY — PX: IR IMAGING GUIDED PORT INSERTION: IMG5740

## 2023-11-14 MED ORDER — LIDOCAINE-EPINEPHRINE 1 %-1:100000 IJ SOLN
20.0000 mL | Freq: Once | INTRAMUSCULAR | Status: AC
  Administered 2023-11-14: 20 mL via INTRADERMAL

## 2023-11-14 MED ORDER — FENTANYL CITRATE (PF) 100 MCG/2ML IJ SOLN
INTRAMUSCULAR | Status: AC
Start: 2023-11-14 — End: 2023-11-14
  Filled 2023-11-14: qty 2

## 2023-11-14 MED ORDER — LIDOCAINE HCL 1 % IJ SOLN
INTRAMUSCULAR | Status: AC
  Filled 2023-11-14: qty 20

## 2023-11-14 MED ORDER — LIDOCAINE-EPINEPHRINE 1 %-1:100000 IJ SOLN
INTRAMUSCULAR | Status: AC
  Filled 2023-11-14: qty 1

## 2023-11-14 MED ORDER — SODIUM CHLORIDE 0.9 % IV SOLN: INTRAVENOUS | Status: DC

## 2023-11-14 MED ORDER — MIDAZOLAM HCL 2 MG/2ML IJ SOLN
INTRAMUSCULAR | Status: AC | PRN
Start: 2023-11-14 — End: 2023-11-14
  Administered 2023-11-14 (×3): 1 mg via INTRAVENOUS

## 2023-11-14 MED ORDER — MIDAZOLAM HCL 2 MG/2ML IJ SOLN
INTRAMUSCULAR | Status: AC
Start: 2023-11-14 — End: 2023-11-14
  Filled 2023-11-14: qty 2

## 2023-11-14 MED ORDER — MIDAZOLAM HCL 2 MG/2ML IJ SOLN
INTRAMUSCULAR | Status: AC
  Filled 2023-11-14: qty 2

## 2023-11-14 MED ORDER — HEPARIN SOD (PORK) LOCK FLUSH 100 UNIT/ML IV SOLN
500.0000 [IU] | Freq: Once | INTRAVENOUS | Status: AC
  Administered 2023-11-14: 500 [IU] via INTRAVENOUS

## 2023-11-14 MED ORDER — HEPARIN SOD (PORK) LOCK FLUSH 100 UNIT/ML IV SOLN
INTRAVENOUS | Status: AC
  Filled 2023-11-14: qty 5

## 2023-11-14 MED ORDER — FENTANYL CITRATE (PF) 100 MCG/2ML IJ SOLN
INTRAMUSCULAR | Status: AC
  Filled 2023-11-14: qty 2

## 2023-11-14 MED ORDER — LIDOCAINE HCL 1 % IJ SOLN
20.0000 mL | Freq: Once | INTRAMUSCULAR | Status: AC
  Administered 2023-11-14: 10 mL via INTRADERMAL

## 2023-11-14 MED ORDER — FENTANYL CITRATE (PF) 100 MCG/2ML IJ SOLN
INTRAMUSCULAR | Status: AC | PRN
  Administered 2023-11-14 (×3): 50 ug via INTRAVENOUS

## 2023-11-14 NOTE — Procedures (Signed)
Interventional Radiology Procedure:   Indications: Bladder cancer  Procedure: Port placement  Findings: Right jugular port, tip at SVC/RA junction  Complications: None     EBL: Minimal, less than 10 ml  Plan: Discharge in one hour.  Keep port site and incisions dry for at least 24 hours.     Ivet Guerrieri R. Emelynn Rance, MD  Pager: [phone]    

## 2023-11-14 NOTE — Discharge Instructions (Signed)
Discharge Instructions:   Please call Interventional Radiology clinic [phone] with any questions or concerns.  You may remove your dressing and shower tomorrow.  Do not use EMLA / Lidocaine cream for 2 weeks post Port Insertion this will remove the surgical glue.   Implanted Port Insertion, Care After The following information offers guidance on how to care for yourself after your procedure. Your health care provider may also give you more specific instructions. If you have problems or questions, contact your health care provider. What can I expect after the procedure? After the procedure, it is common to have: Discomfort at the port insertion site. Bruising on the skin over the port. This should improve over 3-4 days. Follow these instructions at home: Port care After your port is placed, you will get a manufacturer's information card. The card has information about your port. Keep this card with you at all times. Take care of the port as told by your health care provider. Ask your health care provider if you or a family member can get training for taking care of the port at home. A home health care nurse will be be available to help care for the port. Make sure to remember what type of port you have. Incision care     Follow instructions from your health care provider about how to take care of your port insertion site. Make sure you: Wash your hands with soap and water for at least 20 seconds before and after you change your bandage (dressing). If soap and water are not available, use hand sanitizer. Change your dressing as told by your health care provider. Leave stitches (sutures), skin glue, or adhesive strips in place. These skin closures may need to stay in place for 2 weeks or longer. If adhesive strip edges start to loosen and curl up, you may trim the loose edges. Do not remove adhesive strips completely unless your health care provider tells you to do that. Check your  port insertion site every day for signs of infection. Check for: Redness, swelling, or pain. Fluid or blood. Warmth. Pus or a bad smell. Activity Return to your normal activities as told by your health care provider. Ask your health care provider what activities are safe for you. You may have to avoid lifting. Ask your health care provider how much you can safely lift. General instructions Take over-the-counter and prescription medicines only as told by your health care provider. Do not take baths, swim, or use a hot tub until your health care provider approves. Ask your health care provider if you may take showers. You may only be allowed to take sponge baths. If you were given a sedative during the procedure, it can affect you for several hours. Do not drive or operate machinery until your health care provider says that it is safe. Wear a medical alert bracelet in case of an emergency. This will tell any health care providers that you have a port. Keep all follow-up visits. This is important. Contact a health care provider if: You cannot flush your port with saline as directed, or you cannot draw blood from the port. You have a fever or chills. You have redness, swelling, or pain around your port insertion site. You have fluid or blood coming from your port insertion site. Your port insertion site feels warm to the touch. You have pus or a bad smell coming from the port insertion site. Get help right away if: You have chest pain or shortness of   breath. You have bleeding from your port that you cannot control. These symptoms may be an emergency. Get help right away. Call 911. Do not wait to see if the symptoms will go away. Do not drive yourself to the hospital. Summary Take care of the port as told by your health care provider. Keep the manufacturer's information card with you at all times. Change your dressing as told by your health care provider. Contact a health care provider if  you have a fever or chills or if you have redness, swelling, or pain around your port insertion site. Keep all follow-up visits. This information is not intended to replace advice given to you by your health care provider. Make sure you discuss any questions you have with your health care provider. Document Revised: 09/30/2020 Document Reviewed: 09/30/2020 Elsevier Patient Education  2023 Elsevier Inc.    Moderate Conscious Sedation, Adult, Care After This sheet gives you information about how to care for yourself after your procedure. Your health care provider may also give you more specific instructions. If you have problems or questions, contact your health care provider. What can I expect after the procedure? After the procedure, it is common to have: Sleepiness for several hours. Impaired judgment for several hours. Difficulty with balance. Vomiting if you eat too soon. Follow these instructions at home: For the time period you were told by your health care provider: Rest. Do not participate in activities where you could fall or become injured. Do not drive or use machinery. Do not drink alcohol. Do not take sleeping pills or medicines that cause drowsiness. Do not make important decisions or sign legal documents. Do not take care of children on your own. Eating and drinking  Follow the diet recommended by your health care provider. Drink enough fluid to keep your urine pale yellow. If you vomit: Drink water, juice, or soup when you can drink without vomiting. Make sure you have little or no nausea before eating solid foods. General instructions Take over-the-counter and prescription medicines only as told by your health care provider. Have a responsible adult stay with you for the time you are told. It is important to have someone help care for you until you are awake and alert. Do not smoke. Keep all follow-up visits as told by your health care provider. This is  important. Contact a health care provider if: You are still sleepy or having trouble with balance after 24 hours. You feel light-headed. You keep feeling nauseous or you keep vomiting. You develop a rash. You have a fever. You have redness or swelling around the IV site. Get help right away if: You have trouble breathing. You have new-onset confusion at home. Summary After the procedure, it is common to feel sleepy, have impaired judgment, or feel nauseous if you eat too soon. Rest after you get home. Know the things you should not do after the procedure. Follow the diet recommended by your health care provider and drink enough fluid to keep your urine pale yellow. Get help right away if you have trouble breathing or new-onset confusion at home. This information is not intended to replace advice given to you by your health care provider. Make sure you discuss any questions you have with your health care provider. Document Revised: 07/27/2019 Document Reviewed: 02/22/2019 Elsevier Patient Education  2023 Elsevier Inc.  

## 2023-11-14 NOTE — Progress Notes (Signed)
 1030 Ice pack given to use as needed for comfort to right upper neck and right upper chest as instructed.

## 2023-11-18 ENCOUNTER — Inpatient Hospital Stay

## 2023-11-18 NOTE — Progress Notes (Signed)
 [facility] CSW Progress Note  Clinical Child psychotherapist contacted patient by phone to follow-up on medical supplies as requested by provider.Patient has been in contact with supplier and the supplies are on the way (approx. delivery 8/08 or 8/09). Patient expressed frustrations with adjustment to diagnosis and effects of treatment. Patient will be discussing psychiatric medicine with PCP, patient is aware he can ask for a referral to psychiatric medicine.   Follow Up Plan:  No follow up scheduled.    Lizbeth Sprague, LCSW Clinical Social Worker [facility]

## 2023-11-23 ENCOUNTER — Other Ambulatory Visit: Payer: Self-pay

## 2023-11-23 NOTE — Assessment & Plan Note (Deleted)
 Continue adjuvant therapy with nivo, plan on 1 year as tolerated. Return every 2 weeks with lab, visit and then infusion Repeat CT about end of Oct

## 2023-11-23 NOTE — Assessment & Plan Note (Deleted)
 Mild and improving Recommend 1% hydrocortisone twice daily

## 2023-11-23 NOTE — Progress Notes (Deleted)
 [facility] OFFICE PROGRESS NOTE  Patient Care Team: System, Provider Not In as PCP - General  Todd [name] is a 68 y.o.male with history of colon cancer, MIBC, MLH1 mutation, COPD, back fusion with chronic back pain being follow up with for urothelial carcinoma.    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 Return every 2 weeks with lab, visit and then infusion Repeat CT about end of Oct Low serum vitamin B12 History of ileal conduit Has been B12 over few months. If persistently low, will start B12 injection Stage 3a chronic kidney disease (HCC) Fluid goal 60-70 oz per day Monitor lab each visit for potential irAE Rash Mild and improving Recommend 1% hydrocortisone twice daily  No orders of the defined types were placed in this encounter.    Todd [name], MD  INTERVAL HISTORY: Patient returns for follow-up.  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  2016-2018 He then had multiple other TURBTs with intermittent adherence/tolerability to BCG     05/27/2014 Procedure   Repeat TURBT, path again with LgTa. Only completed 2/6 BCG treatments, then lost to follow-up.    06/09/2015 Procedure   Repeat TURBT with 3 papillary tumors, pathology with LgTa and CIS.    06/2015 Imaging   CT A/P with diffuse bladder wall thickening, no definitive evidence of metastatic disease    07/21/2015 Procedure    Repeat TURBT with path showing high grade dysplasia. Again scheduled for BCG but only completed 1-2 doses.    10/19/2016 Procedure   Repeat TURBT with low grade papillary urothelial carcinoma/CIS. Received 4/6 doses of BCG. Lost to follow-up.    03/09/2019 Imaging   CT CAP with LUL 2cm GGO, minimal right urinary bladder wall thickening of 4mm    01/22/2020 Imaging   CT A/P in the ED for N/V/D/weight loss. Bladder thickening more pronounced at 7mm.    07/04/20 Imaging   CT urogram for hematuria showed new right lateral masslike thickening of bladder wall, measure 8mm. No evidence metastatic disease.    08/13/2020 Procedure   Repeat TURBT with invasive high-grade urothelial carcinoma with nonfocal invasion into lamina propria (CIS present), second specimen with invasive high-grade urothelial carcinoma extensively involving lamina propria, with one fragment showing involvement of muscle bundles which are equivocal for muscularis mucosae or muscularis propria (CIS again present).    09/19/2020 Procedure   Repeat TURBT for re-resection with invasive high-grade urothelial carcinoma with non-focal lamina propria invasion and CIS, no involvement of muscularis propria    11/20/2020 - 09/17/2021 Chemotherapy   C1 - 8 Pembrolizumab    12/05/2022 Imaging   MRI pelvis wo contrast -Increased size of right posterolateral bladder wall mass, now measuring up to 3.2 cm, previously 1.2 cm, with extramural extension of the mass encasing the distal right ureter and resulting in upstream right ureteral dilation. The extravesicular extension of the bladder mass also is in close contact with the vas deferens near the seminal vesicles for which abutment or invasion cannot be excluded.  01/03/2023 Imaging   CT AP -Increasing right posterior lateral bladder wall thickening, concerning for disease progression. New right ureteral obstruction associated hydroureteronephrosis.  -Multiple subcentimeter hypoattenuating  foci within the liver, favored to be benign given stability since 2016.   CT Chest: No intrathoracic metastasis with stable findings since December 28.     01/17/2023 PET scan   PET from [facility] - Thickening of the right posterolateral bladder wall, similar to prior CT dated 01/03/2023.  - Minimally FDG avid bilateral inguinal lymph nodes which do not appear pathologically enlarged or abnormal in appearance. These are favored to be reactive.  -There is a possible mildly hypermetabolic nodule along the posterior wall of the rectum which is not completely characterized on this study. Suggest correlation with colonoscopy, which patient has scheduled 01/20/2023.    02/07/2023 Pathology Results   A: Ureter margin, right distal, biopsy - Benign ureter with chronic inflammation - Negative for malignancy   B: Ureter margin, left distal, biopsy - Focal atypia, worrisome for focal involvement by urothelial carcinoma in situ   C: Bladder and prostate, cystoprostatectomy   BLADDER - Multifocal invasive high grade papillary urothelial carcinoma              - Dominant tumor is in right posterior upper bladder wall, 3.0 cm, invasive through muscularis propria with macroscopic invasion of peri-vesicle adipose tissue              - Satellite tumor in left bladder extending into left ureter, 0.9 cm, invasive into lamina propria - Extensive multifocal high grade papillary urothelial carcinoma and urothelial carcinoma in situ present in all random sections of the bladder mucosa (anterior, posterior, dome, trigone, right lateral, left lateral walls) away from invasive cancers - 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 (contiguous with carcinoma in situ of trigone), distal margin uninvolved - Margins of resection negative for invasive carcinoma - Left ureter margin of this specimen shows urothelial carcinoma in situ, superceded by additional specimens, final left ureter margin is in specimen F, which  shows patchy atypia 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 - See synoptic report    PROSTATE - 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 and involves prostatic ducts - Prostatic stroma with no evidence of involvement by urothelial carcinoma, negative for prostate carcinoma - Prostatic urethral distal margin negative   D: Lymph node, right pelvic, biopsy - Five lymph nodes with no evidence of malignancy (0/5)    E: Lymph node, left pelvic, biopsy - Metastatic carcinoma present in one of five lymph nodes (1/5), size of metastatic deposit 1.4 mm, without extranodal extension   F: True ureteral margin, left distal, biopsy - 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 - Negative for invasive carcinoma   G: True ureteral margin, right distal, biopsy - Benign ureter with chronic inflammation - Negative for malignancy  SPECIMEN    Procedure:    Radical cystoprostatectomy   TUMOR    Tumor Site:    Right lateral wall     Tumor Site:    Left lateral wall     Tumor Site:    Posterior wall     Histologic Type:    Urothelial carcinoma, invasive (conventional)     Histologic Grade:    High-grade     Tumor Size:    Greatest Dimension (Centimeters): 3.0 cm       Additional Dimension (Centimeters):    2.9 cm       Additional Dimension (  Centimeters):    2.8 cm     Tumor Extent:    Invades perivesical soft tissue microscopically     Lymphatic and / or Vascular Invasion:    Not identified     Tumor Configuration:    Papillary     Tumor Configuration:    Solid / nodule     Tumor Configuration:    Flat     Treatment Effect Post Neoadjuvant Chemotherapy:    Weak and no response: residual cancer cells occupying greater than or equal to 50% of the tumor bed or absence of regressive changes (TRG3)     Tumor Comment:    Appears to have received 1 dose of pembrolizumab, no apparent histologic response.   MARGINS    Margin Status for Invasive Tumor:     All margins negative for invasive tumor       Closest Margin(s) to Invasive tumor:    Soft tissue: right peri-vesicular margin       Distance from Invasive Tumor to Closest Margin:    13 mm     Margin Status for Carcinoma in Situ / Noninvasive Papillary Urothelial Carcinoma:    Carcinoma in situ / noninvasive papillary urothelial carcinoma present at margin       Margin(s) Involved by Carcinoma in Situ / Noninvasive Papillary Urothelial Carcinoma:    Left ureteral   REGIONAL LYMPH NODES     Regional Lymph Node Status:          :    Tumor present in regional lymph node(s)         Number of Lymph Nodes with Tumor:    1         Size of Largest Nodal Metastatic Deposit:    0.14 cm         Nodal Site with Largest Metastatic Deposit:    Left pelvic         Size of Largest Lymph Node with Tumor:    0.7 cm         Largest Lymph Node with Tumor:    left pelvic         Extranodal Extension (ENE):    Not identified       Number of Lymph Nodes Examined:    10   pTNM CLASSIFICATION (AJCC 8th Edition)     Reporting of pT, pN, and (when applicable) pM categories is based on information available to the pathologist at the time the report is issued. As per the AJCC (Chapter 1, 8th Ed.) it is the managing physician's responsibility to establish the final pathologic stage based upon all pertinent information, including but potentially not limited to this pathology report.     Modified Classification:    y     pT Category:    pT3a     T Suffix:    (m)     pN Category:    pN1    02/20/2023 Procedure   Colonoscopy Impression:    - The examined portion of the ileum was normal.                         - The entire examined colon is normal on direct and retroflexion views.                         - Views were limited by liquid stool throughout the colon and difficulty suctioning the stool.                         -  No specimens collected.    02/23/2023 Imaging   CT AP Hepatobiliary: Hypodensities in liver  consistent benign cysts. Postcholecystectomy. Mild extrahepatic biliary duct dilatation not changed from prior.   03/03/2023 -  Chemotherapy   First visit to [facility] Med onc. Initially planned for chemoradiation. Due to decrease PS and recurrent hospitalization, did not pursue chemotherapy. Discussed pending PS to consider adjuvant nivolumab  after radiation.     04/06/2023 Imaging   CT AP from [facility] Relative hypoenhancement of the right kidney when compared to the left which is nonspecific. This could be related to altered perfusion or infection/pyelonephritis. No significant hydronephrosis is identified. Clinical correlation is recommended.  LIVER: Normal liver contour. Subcentimeter hypoattenuating hepatic foci too small to characterize further on CT.    05/09/2023 - 06/10/2023 Radiation Therapy   Adjuvant IMRT to bladder 45 Gy/ 25 fxs   05/10/2023 Cancer Staging   Staging form: Urinary Bladder, AJCC 8th Edition - Clinical: Stage IIIA (cT3, cN1, cM0) - Signed by Tina Todd BROCKS, MD on 05/10/2023 WHO/ISUP grade (low/high): High Grade Histologic grading system: 2 grade system   05/19/2023 Imaging   Presented to ED with abdominal pain CT AP: IMPRESSION: 1. Surgical resection of the bladder and prostate with ileal conduit present in the right lower quadrant. 2. No evidence of metastatic disease. 3. Mild delay of nephrogram on the right without hydronephrosis, possibly renovascular disease. 4. Aortic atherosclerosis.  CT chest: no metastases   08/23/2023 Imaging   CT AP IMPRESSION: Postsurgical changes as described above.   No acute abnormality is noted   09/12/2023 Imaging   CT chest: NED   10/13/2023 -  Chemotherapy   Patient is on Treatment Plan : BLADDER Nivolumab  (240) q14d        PHYSICAL EXAMINATION: ECOG PERFORMANCE STATUS: {CHL ONC ECOG [phone]}  There were no vitals filed for this visit. There were no vitals filed for this visit.  GENERAL: alert, no distress and  comfortable SKIN: skin color normal and no bruising or petechiae or jaundice on exposed skin EYES: normal, sclera clear OROPHARYNX: no exudate  NECK: No palpable mass LYMPH:  no palpable cervical, axillary lymphadenopathy  LUNGS: clear to auscultation and percussion with normal breathing effort HEART: regular rate & rhythm  ABDOMEN: abdomen soft, non-tender and nondistended. Musculoskeletal: no edema NEURO: no focal motor/sensory deficits  Relevant data reviewed during this visit included ***

## 2023-11-23 NOTE — Assessment & Plan Note (Deleted)
 Fluid goal 60-70 oz per day Monitor lab each visit for potential irAE

## 2023-11-23 NOTE — Assessment & Plan Note (Deleted)
 History of ileal conduit Has been B12 over few months. If persistently low, will start B12 injection

## 2023-11-24 ENCOUNTER — Inpatient Hospital Stay

## 2023-11-24 ENCOUNTER — Inpatient Hospital Stay: Payer: Medicare (Managed Care)

## 2023-11-24 ENCOUNTER — Other Ambulatory Visit: Payer: Self-pay | Admitting: *Deleted

## 2023-11-24 ENCOUNTER — Inpatient Hospital Stay (HOSPITAL_BASED_OUTPATIENT_CLINIC_OR_DEPARTMENT_OTHER)

## 2023-11-24 VITALS — BP 141/77 | HR 90 | Temp 97.6°F | Resp 17 | Wt 179.1 lb

## 2023-11-24 DIAGNOSIS — Z5112 Encounter for antineoplastic immunotherapy: Secondary | ICD-10-CM | POA: Diagnosis not present

## 2023-11-24 DIAGNOSIS — N1832 Chronic kidney disease, stage 3b: Secondary | ICD-10-CM

## 2023-11-24 DIAGNOSIS — F3289 Other specified depressive episodes: Secondary | ICD-10-CM

## 2023-11-24 DIAGNOSIS — E538 Deficiency of other specified B group vitamins: Secondary | ICD-10-CM | POA: Diagnosis not present

## 2023-11-24 DIAGNOSIS — C679 Malignant neoplasm of bladder, unspecified: Secondary | ICD-10-CM

## 2023-11-24 DIAGNOSIS — R197 Diarrhea, unspecified: Secondary | ICD-10-CM | POA: Insufficient documentation

## 2023-11-24 DIAGNOSIS — C775 Secondary and unspecified malignant neoplasm of intrapelvic lymph nodes: Secondary | ICD-10-CM

## 2023-11-24 DIAGNOSIS — N1831 Chronic kidney disease, stage 3a: Secondary | ICD-10-CM

## 2023-11-24 DIAGNOSIS — R21 Rash and other nonspecific skin eruption: Secondary | ICD-10-CM

## 2023-11-24 LAB — CBC WITH DIFFERENTIAL (CANCER CENTER ONLY)
Abs Immature Granulocytes: 0.01 K/uL (ref 0.00–0.07)
Basophils Absolute: 0 K/uL (ref 0.0–0.1)
Basophils Relative: 0 %
Eosinophils Absolute: 0.3 K/uL (ref 0.0–0.5)
Eosinophils Relative: 5 %
HCT: 41.9 % (ref 39.0–52.0)
Hemoglobin: 14.2 g/dL (ref 13.0–17.0)
Immature Granulocytes: 0 %
Lymphocytes Relative: 12 %
Lymphs Abs: 0.7 K/uL (ref 0.7–4.0)
MCH: 32.7 pg (ref 26.0–34.0)
MCHC: 33.9 g/dL (ref 30.0–36.0)
MCV: 96.5 fL (ref 80.0–100.0)
Monocytes Absolute: 0.6 K/uL (ref 0.1–1.0)
Monocytes Relative: 11 %
Neutro Abs: 3.9 K/uL (ref 1.7–7.7)
Neutrophils Relative %: 72 %
Platelet Count: 144 K/uL — ABNORMAL LOW (ref 150–400)
RBC: 4.34 MIL/uL (ref 4.22–5.81)
RDW: 13.2 % (ref 11.5–15.5)
WBC Count: 5.4 K/uL (ref 4.0–10.5)
nRBC: 0 % (ref 0.0–0.2)

## 2023-11-24 LAB — CMP (CANCER CENTER ONLY)
ALT: 10 U/L (ref 0–44)
AST: 16 U/L (ref 15–41)
Albumin: 3.7 g/dL (ref 3.5–5.0)
Alkaline Phosphatase: 61 U/L (ref 38–126)
Anion gap: 9 (ref 5–15)
BUN: 23 mg/dL (ref 8–23)
CO2: 21 mmol/L — ABNORMAL LOW (ref 22–32)
Calcium: 8.4 mg/dL — ABNORMAL LOW (ref 8.9–10.3)
Chloride: 106 mmol/L (ref 98–111)
Creatinine: 1.89 mg/dL — ABNORMAL HIGH (ref 0.61–1.24)
GFR, Estimated: 38 mL/min — ABNORMAL LOW (ref >60)
Glucose, Bld: 134 mg/dL — ABNORMAL HIGH (ref 70–99)
Potassium: 3.9 mmol/L (ref 3.5–5.1)
Sodium: 136 mmol/L (ref 135–145)
Total Bilirubin: 0.5 mg/dL (ref 0.0–1.2)
Total Protein: 7.2 g/dL (ref 6.5–8.1)

## 2023-11-24 LAB — T4, FREE: Free T4: 1.2 ng/dL — ABNORMAL HIGH (ref 0.61–1.12)

## 2023-11-24 LAB — VITAMIN B12: Vitamin B-12: 336 pg/mL (ref 180–914)

## 2023-11-24 NOTE — Assessment & Plan Note (Addendum)
 Repeat labs today Daily B12 1000 mcg daily.

## 2023-11-24 NOTE — Assessment & Plan Note (Addendum)
 Hold treatment this week  return in 2 weeks with lab, visit and then infusion Repeat CT about end of Oct

## 2023-11-24 NOTE — Assessment & Plan Note (Addendum)
 Fluid goal 60-70 oz per day Monitor lab each visit for potential irAE

## 2023-11-24 NOTE — Progress Notes (Signed)
 [facility] OFFICE PROGRESS NOTE  Patient Care Team: System, Provider Not In as PCP - General  Todd [name] is a 68 y.o.male with history of colon cancer, MIBC, MLH1 mutation, COPD, back fusion with chronic back pain being follow up with for urothelial carcinoma.    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 After improvement of PS, started adjuvant nivo on 10/13/23  Patient had 3 episodes of watery diarrhea yesterday.  Discussed holding treatment this week.  Patient will return in 2 weeks.  He will let us  know if worsening symptoms.  Of note, patient report angry and depressed due to prostatectomy.  He denies any thoughts of harming himself or others.  We placed a referral to psychiatry for evaluation.  Report he has been on antidepressant. 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  No thoughts about harming others. Referral placed to psychiatry Recommend continue home antidepressant Low serum vitamin B12 Repeat labs today Daily B12 1000 mcg daily. Watery diarrhea 3 episodes yesterday.  None today.  Concerning for early immune related reaction.  Holding treatment this week and reevaluate in 2 weeks.  Patient reports he is able to drink fluid himself.  Follow-up as scheduled for 2 weeks.   Todd [name], MD  INTERVAL HISTORY: Patient returns for follow-up. Fatigue for 3-4 days after last infusion and resolved. Report his neck is bother him.  Report watery diarrhea yesterday 3x. None  today. No change in stomach pain. No fever. No rash or coughing. He drinks 4-5 bottle of water  daily and urine is clear mostly.  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  2016-2018 He then had multiple other TURBTs with intermittent adherence/tolerability to BCG     05/27/2014 Procedure   Repeat TURBT, path again with LgTa. Only completed 2/6 BCG treatments, then lost to follow-up.    06/09/2015 Procedure   Repeat TURBT with 3 papillary tumors, pathology with LgTa and CIS.    06/2015 Imaging   CT A/P with diffuse bladder wall thickening, no definitive evidence of metastatic disease    07/21/2015 Procedure   Repeat TURBT with path showing high grade dysplasia. Again scheduled for BCG but only completed 1-2 doses.    10/19/2016 Procedure   Repeat TURBT with low grade papillary urothelial carcinoma/CIS. Received 4/6 doses of BCG. Lost to follow-up.    03/09/2019 Imaging   CT CAP with LUL 2cm GGO, minimal right urinary bladder wall thickening of 4mm    01/22/2020 Imaging   CT A/P in the ED for N/V/D/weight loss. Bladder thickening more pronounced at 7mm.    Jul 21, 2020 Imaging   CT urogram for hematuria showed new right lateral masslike thickening of bladder wall, measure 8mm. No evidence metastatic disease.    08/13/2020 Procedure   Repeat TURBT with invasive high-grade urothelial carcinoma with nonfocal invasion into lamina propria (CIS present), second specimen with invasive high-grade urothelial carcinoma extensively involving lamina propria, with one fragment  showing involvement of muscle bundles which are equivocal for muscularis mucosae or muscularis propria (CIS again present).    09/19/2020 Procedure   Repeat TURBT for re-resection with invasive high-grade urothelial carcinoma with non-focal lamina propria invasion and  CIS, no involvement of muscularis propria    11/20/2020 - 09/17/2021 Chemotherapy   C1 - 8 Pembrolizumab    12/05/2022 Imaging   MRI pelvis wo contrast -Increased size of right posterolateral bladder wall mass, now measuring up to 3.2 cm, previously 1.2 cm, with extramural extension of the mass encasing the distal right ureter and resulting in upstream right ureteral dilation. The extravesicular extension of the bladder mass also is in close contact with the vas deferens near the seminal vesicles for which abutment or invasion cannot be excluded.     01/03/2023 Imaging   CT AP -Increasing right posterior lateral bladder wall thickening, concerning for disease progression. New right ureteral obstruction associated hydroureteronephrosis.  -Multiple subcentimeter hypoattenuating foci within the liver, favored to be benign given stability since 2016.   CT Chest: No intrathoracic metastasis with stable findings since December 28.     01/17/2023 PET scan   PET from [facility] - Thickening of the right posterolateral bladder wall, similar to prior CT dated 01/03/2023.  - Minimally FDG avid bilateral inguinal lymph nodes which do not appear pathologically enlarged or abnormal in appearance. These are favored to be reactive.  -There is a possible mildly hypermetabolic nodule along the posterior wall of the rectum which is not completely characterized on this study. Suggest correlation with colonoscopy, which patient has scheduled 01/20/2023.    02/07/2023 Pathology Results   A: Ureter margin, right distal, biopsy - Benign ureter with chronic inflammation - Negative for malignancy   B: Ureter margin, left distal, biopsy - Focal atypia, worrisome for focal involvement by urothelial carcinoma in situ   C: Bladder and prostate, cystoprostatectomy   BLADDER - Multifocal invasive high grade papillary urothelial carcinoma              - Dominant tumor is in right posterior upper bladder wall, 3.0 cm, invasive  through muscularis propria with macroscopic invasion of peri-vesicle adipose tissue              - Satellite tumor in left bladder extending into left ureter, 0.9 cm, invasive into lamina propria - Extensive multifocal high grade papillary urothelial carcinoma and urothelial carcinoma in situ present in all random sections of the bladder mucosa (anterior, posterior, dome, trigone, right lateral, left lateral walls) away from invasive cancers - 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 (contiguous with carcinoma in situ of trigone), distal margin uninvolved - Margins of resection negative for invasive carcinoma - Left ureter margin of this specimen shows urothelial carcinoma in situ, superceded by additional specimens, final left ureter margin is in specimen F, which shows patchy atypia 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 - See synoptic report    PROSTATE - 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 and involves prostatic ducts - Prostatic stroma with no evidence of involvement by urothelial carcinoma, negative for prostate carcinoma - Prostatic urethral distal margin negative   D: Lymph node, right pelvic, biopsy - Five lymph nodes with no evidence of malignancy (0/5)    E: Lymph node, left pelvic, biopsy - Metastatic carcinoma present in one of five lymph nodes (1/5), size of metastatic deposit 1.4 mm, without extranodal extension   F: True ureteral margin, left distal, biopsy - Focal atypia 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  by urothelial carcinoma in situ - Negative for invasive carcinoma   G: True ureteral margin, right distal, biopsy - Benign ureter with chronic inflammation - Negative for malignancy  SPECIMEN    Procedure:    Radical cystoprostatectomy   TUMOR    Tumor Site:    Right lateral wall     Tumor Site:    Left lateral wall     Tumor Site:    Posterior wall     Histologic Type:    Urothelial carcinoma, invasive (conventional)      Histologic Grade:    High-grade     Tumor Size:    Greatest Dimension (Centimeters): 3.0 cm       Additional Dimension (Centimeters):    2.9 cm       Additional Dimension (Centimeters):    2.8 cm     Tumor Extent:    Invades perivesical soft tissue microscopically     Lymphatic and / or Vascular Invasion:    Not identified     Tumor Configuration:    Papillary     Tumor Configuration:    Solid / nodule     Tumor Configuration:    Flat     Treatment Effect Post Neoadjuvant Chemotherapy:    Weak and no response: residual cancer cells occupying greater than or equal to 50% of the tumor bed or absence of regressive changes (TRG3)     Tumor Comment:    Appears to have received 1 dose of pembrolizumab, no apparent histologic response.   MARGINS    Margin Status for Invasive Tumor:    All margins negative for invasive tumor       Closest Margin(s) to Invasive tumor:    Soft tissue: right peri-vesicular margin       Distance from Invasive Tumor to Closest Margin:    13 mm     Margin Status for Carcinoma in Situ / Noninvasive Papillary Urothelial Carcinoma:    Carcinoma in situ / noninvasive papillary urothelial carcinoma present at margin       Margin(s) Involved by Carcinoma in Situ / Noninvasive Papillary Urothelial Carcinoma:    Left ureteral   REGIONAL LYMPH NODES     Regional Lymph Node Status:          :    Tumor present in regional lymph node(s)         Number of Lymph Nodes with Tumor:    1         Size of Largest Nodal Metastatic Deposit:    0.14 cm         Nodal Site with Largest Metastatic Deposit:    Left pelvic         Size of Largest Lymph Node with Tumor:    0.7 cm         Largest Lymph Node with Tumor:    left pelvic         Extranodal Extension (ENE):    Not identified       Number of Lymph Nodes Examined:    10   pTNM CLASSIFICATION (AJCC 8th Edition)     Reporting of pT, pN, and (when applicable) pM categories is based on information available to the pathologist at the time  the report is issued. As per the AJCC (Chapter 1, 8th Ed.) it is the managing physician's responsibility to establish the final pathologic stage based upon all pertinent information, including but potentially not limited to this pathology report.  Modified Classification:    y     pT Category:    pT3a     T Suffix:    (m)     pN Category:    pN1    02/20/2023 Procedure   Colonoscopy Impression:    - The examined portion of the ileum was normal.                         - The entire examined colon is normal on direct and retroflexion views.                         - Views were limited by liquid stool throughout the colon and difficulty suctioning the stool.                         - No specimens collected.    02/23/2023 Imaging   CT AP Hepatobiliary: Hypodensities in liver consistent benign cysts. Postcholecystectomy. Mild extrahepatic biliary duct dilatation not changed from prior.   03/03/2023 -  Chemotherapy   First visit to [facility] onc. Initially planned for chemoradiation. Due to decrease PS and recurrent hospitalization, did not pursue chemotherapy. Discussed pending PS to consider adjuvant nivolumab  after radiation.     04/06/2023 Imaging   CT AP from [facility] Relative hypoenhancement of the right kidney when compared to the left which is nonspecific. This could be related to altered perfusion or infection/pyelonephritis. No significant hydronephrosis is identified. Clinical correlation is recommended.  LIVER: Normal liver contour. Subcentimeter hypoattenuating hepatic foci too small to characterize further on CT.    05/09/2023 - 06/10/2023 Radiation Therapy   Adjuvant IMRT to bladder 45 Gy/ 25 fxs   05/10/2023 Cancer Staging   Staging form: Urinary Bladder, AJCC 8th Edition - Clinical: Stage IIIA (cT3, cN1, cM0) - Signed by Tina Todd BROCKS, MD on 05/10/2023 WHO/ISUP grade (low/high): High Grade Histologic grading system: 2 grade system   05/19/2023 Imaging   Presented to ED  with abdominal pain CT AP: IMPRESSION: 1. Surgical resection of the bladder and prostate with ileal conduit present in the right lower quadrant. 2. No evidence of metastatic disease. 3. Mild delay of nephrogram on the right without hydronephrosis, possibly renovascular disease. 4. Aortic atherosclerosis.  CT chest: no metastases   08/23/2023 Imaging   CT AP IMPRESSION: Postsurgical changes as described above.   No acute abnormality is noted   09/12/2023 Imaging   CT chest: NED   10/13/2023 -  Chemotherapy   Patient is on Treatment Plan : BLADDER Nivolumab  (240) q14d        PHYSICAL EXAMINATION: ECOG PERFORMANCE STATUS: 1 - Symptomatic but completely ambulatory  Vitals:   11/24/23 1600  BP: (!) 141/77  Pulse: 90  Resp: 17  Temp: 97.6 F (36.4 C)  SpO2: 98%   Filed Weights   11/24/23 1600  Weight: 179 lb 1.6 oz (81.2 kg)    GENERAL: alert, no distress and comfortable LUNGS: clear to auscultation and percussion with normal breathing effort HEART: regular rate & rhythm  ABDOMEN: abdomen soft, non-tender and nondistended. urostomy bag. Skin: No rash  Relevant data reviewed during this visit included labs.

## 2023-11-24 NOTE — Assessment & Plan Note (Addendum)
 3 episodes yesterday.  None today.  Concerning for early immune related reaction.  Holding treatment this week and reevaluate in 2 weeks.  Patient reports he is able to drink fluid himself.

## 2023-11-24 NOTE — Assessment & Plan Note (Addendum)
 No suicidal thoughts.  No thoughts about harming others. Referral placed to psychiatry Recommend continue home antidepressant

## 2023-11-25 ENCOUNTER — Ambulatory Visit

## 2023-11-25 LAB — TSH: TSH: 2.84 u[IU]/mL (ref 0.350–4.500)

## 2023-11-30 LAB — METHYLMALONIC ACID, SERUM: Methylmalonic Acid, Quantitative: 322 nmol/L (ref 0–378)

## 2023-12-01 ENCOUNTER — Ambulatory Visit: Payer: Self-pay

## 2023-12-01 ENCOUNTER — Other Ambulatory Visit: Payer: Self-pay

## 2023-12-07 NOTE — Assessment & Plan Note (Signed)
 Hold treatment this week  return in 2 weeks with lab, visit and then infusion Repeat CT about end of Oct

## 2023-12-07 NOTE — Progress Notes (Unsigned)
 [facility] OFFICE PROGRESS NOTE  Patient Care Team: System, Provider Not In as PCP - General  Todd [name] is a 68 y.o.male with history of colon cancer, MIBC, MLH1 mutation, COPD, back fusion with chronic back pain being follow up with for urothelial carcinoma.    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 After improvement of PS, started adjuvant nivo on 10/13/23  Diarrhea resolved.  Clinically he is ready to resume treatment.  We had referred to psychiatry last time.  He has not received a call.  I will reach out to our social worker to help with this follow-up. 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  He would like to receive oxycodone , he normally takes 10 mg twice a day.  One-time dose of oxycodone  IR ordered to be given while in infusion.  Patient will return as scheduled in 2 weeks  [name], MD  INTERVAL HISTORY: Patient returns for follow-up.  Diarrhea resolved.  No cough, rash, abdominal pain or new symptoms.  Report he has some pain over the ostomy area and forgot his oxycodone .  He asked for dose today.  He also forgot his anxiety medication.  He is still upset about [facility] to get his prostate.  I let him know that there are some prostatic involvement per pathology report.    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  2016-2018 He then had multiple other TURBTs with intermittent adherence/tolerability to BCG     05/27/2014 Procedure   Repeat TURBT, path again with LgTa. Only completed 2/6 BCG treatments, then lost to follow-up.    06/09/2015 Procedure   Repeat TURBT with 3 papillary tumors, pathology with LgTa and CIS.    06/2015 Imaging   CT A/P with diffuse bladder wall thickening, no definitive evidence of metastatic disease    07/21/2015 Procedure   Repeat TURBT with path showing high grade dysplasia. Again scheduled for BCG but only completed 1-2 doses.    10/19/2016 Procedure   Repeat TURBT with low grade papillary urothelial carcinoma/CIS. Received 4/6 doses of BCG. Lost to follow-up.    03/09/2019 Imaging   CT CAP with LUL 2cm GGO, minimal right urinary bladder wall thickening of 4mm    01/22/2020 Imaging   CT A/P in the ED for N/V/D/weight loss. Bladder thickening more pronounced at 7mm.    July 19, 2020 Imaging   CT urogram for hematuria showed new right lateral masslike thickening of bladder wall, measure 8mm. No evidence metastatic disease.    08/13/2020 Procedure   Repeat TURBT with invasive high-grade urothelial carcinoma with nonfocal invasion into lamina propria (CIS present), second specimen with invasive high-grade urothelial carcinoma extensively involving lamina propria, with one fragment showing involvement of muscle bundles which are equivocal for muscularis mucosae or muscularis propria (CIS again present).    09/19/2020 Procedure   Repeat TURBT for re-resection with invasive high-grade urothelial carcinoma with non-focal  lamina propria invasion and CIS, no involvement of muscularis propria    11/20/2020 - 09/17/2021 Chemotherapy   C1 - 8 Pembrolizumab    12/05/2022 Imaging   MRI pelvis wo contrast -Increased size of right posterolateral bladder wall mass, now measuring up to 3.2 cm, previously 1.2 cm, with extramural extension of  the mass encasing the distal right ureter and resulting in upstream right ureteral dilation. The extravesicular extension of the bladder mass also is in close contact with the vas deferens near the seminal vesicles for which abutment or invasion cannot be excluded.     01/03/2023 Imaging   CT AP -Increasing right posterior lateral bladder wall thickening, concerning for disease progression. New right ureteral obstruction associated hydroureteronephrosis.  -Multiple subcentimeter hypoattenuating foci within the liver, favored to be benign given stability since 2016.   CT Chest: No intrathoracic metastasis with stable findings since December 28.     01/17/2023 PET scan   PET from [facility] - Thickening of the right posterolateral bladder wall, similar to prior CT dated 01/03/2023.  - Minimally FDG avid bilateral inguinal lymph nodes which do not appear pathologically enlarged or abnormal in appearance. These are favored to be reactive.  -There is a possible mildly hypermetabolic nodule along the posterior wall of the rectum which is not completely characterized on this study. Suggest correlation with colonoscopy, which patient has scheduled 01/20/2023.    02/07/2023 Pathology Results   A: Ureter margin, right distal, biopsy - Benign ureter with chronic inflammation - Negative for malignancy   B: Ureter margin, left distal, biopsy - Focal atypia, worrisome for focal involvement by urothelial carcinoma in situ   C: Bladder and prostate, cystoprostatectomy   BLADDER - Multifocal invasive high grade papillary urothelial carcinoma              - Dominant tumor is in right posterior upper bladder wall, 3.0 cm, invasive through muscularis propria with macroscopic invasion of peri-vesicle adipose tissue              - Satellite tumor in left bladder extending into left ureter, 0.9 cm, invasive into lamina propria - Extensive multifocal high grade papillary urothelial carcinoma and urothelial carcinoma in  situ present in all random sections of the bladder mucosa (anterior, posterior, dome, trigone, right lateral, left lateral walls) away from invasive cancers - 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 (contiguous with carcinoma in situ of trigone), distal margin uninvolved - Margins of resection negative for invasive carcinoma - Left ureter margin of this specimen shows urothelial carcinoma in situ, superceded by additional specimens, final left ureter margin is in specimen F, which shows patchy atypia 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 - See synoptic report    PROSTATE - 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 and involves prostatic ducts - Prostatic stroma with no evidence of involvement by urothelial carcinoma, negative for prostate carcinoma - Prostatic urethral distal margin negative   D: Lymph node, right pelvic, biopsy - Five lymph nodes with no evidence of malignancy (0/5)    E: Lymph node, left pelvic, biopsy - Metastatic carcinoma present in one of five lymph nodes (1/5), size of metastatic deposit 1.4 mm, without extranodal extension   F: True ureteral margin, left distal, biopsy - 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 - Negative for invasive carcinoma   G: True ureteral margin, right distal, biopsy - Benign ureter with chronic inflammation - Negative for malignancy  SPECIMEN    Procedure:  Radical cystoprostatectomy   TUMOR    Tumor Site:    Right lateral wall     Tumor Site:    Left lateral wall     Tumor Site:    Posterior wall     Histologic Type:    Urothelial carcinoma, invasive (conventional)     Histologic Grade:    High-grade     Tumor Size:    Greatest Dimension (Centimeters): 3.0 cm       Additional Dimension (Centimeters):    2.9 cm       Additional Dimension (Centimeters):    2.8 cm     Tumor Extent:    Invades perivesical soft tissue microscopically     Lymphatic and / or  Vascular Invasion:    Not identified     Tumor Configuration:    Papillary     Tumor Configuration:    Solid / nodule     Tumor Configuration:    Flat     Treatment Effect Post Neoadjuvant Chemotherapy:    Weak and no response: residual cancer cells occupying greater than or equal to 50% of the tumor bed or absence of regressive changes (TRG3)     Tumor Comment:    Appears to have received 1 dose of pembrolizumab, no apparent histologic response.   MARGINS    Margin Status for Invasive Tumor:    All margins negative for invasive tumor       Closest Margin(s) to Invasive tumor:    Soft tissue: right peri-vesicular margin       Distance from Invasive Tumor to Closest Margin:    13 mm     Margin Status for Carcinoma in Situ / Noninvasive Papillary Urothelial Carcinoma:    Carcinoma in situ / noninvasive papillary urothelial carcinoma present at margin       Margin(s) Involved by Carcinoma in Situ / Noninvasive Papillary Urothelial Carcinoma:    Left ureteral   REGIONAL LYMPH NODES     Regional Lymph Node Status:          :    Tumor present in regional lymph node(s)         Number of Lymph Nodes with Tumor:    1         Size of Largest Nodal Metastatic Deposit:    0.14 cm         Nodal Site with Largest Metastatic Deposit:    Left pelvic         Size of Largest Lymph Node with Tumor:    0.7 cm         Largest Lymph Node with Tumor:    left pelvic         Extranodal Extension (ENE):    Not identified       Number of Lymph Nodes Examined:    10   pTNM CLASSIFICATION (AJCC 8th Edition)     Reporting of pT, pN, and (when applicable) pM categories is based on information available to the pathologist at the time the report is issued. As per the AJCC (Chapter 1, 8th Ed.) it is the managing physician's responsibility to establish the final pathologic stage based upon all pertinent information, including but potentially not limited to this pathology report.     Modified Classification:    y     pT  Category:    pT3a     T Suffix:    (m)     pN Category:    pN1  02/20/2023 Procedure   Colonoscopy Impression:    - The examined portion of the ileum was normal.                         - The entire examined colon is normal on direct and retroflexion views.                         - Views were limited by liquid stool throughout the colon and difficulty suctioning the stool.                         - No specimens collected.    02/23/2023 Imaging   CT AP Hepatobiliary: Hypodensities in liver consistent benign cysts. Postcholecystectomy. Mild extrahepatic biliary duct dilatation not changed from prior.   03/03/2023 -  Chemotherapy   First visit to [facility] Med onc. Initially planned for chemoradiation. Due to decrease PS and recurrent hospitalization, did not pursue chemotherapy. Discussed pending PS to consider adjuvant nivolumab  after radiation.     04/06/2023 Imaging   CT AP from [facility] Relative hypoenhancement of the right kidney when compared to the left which is nonspecific. This could be related to altered perfusion or infection/pyelonephritis. No significant hydronephrosis is identified. Clinical correlation is recommended.  LIVER: Normal liver contour. Subcentimeter hypoattenuating hepatic foci too small to characterize further on CT.    05/09/2023 - 06/10/2023 Radiation Therapy   Adjuvant IMRT to bladder 45 Gy/ 25 fxs   05/10/2023 Cancer Staging   Staging form: Urinary Bladder, AJCC 8th Edition - Clinical: Stage IIIA (cT3, cN1, cM0) - Signed by Tina Pauletta BROCKS, MD on 05/10/2023 WHO/ISUP grade (low/high): High Grade Histologic grading system: 2 grade system   05/19/2023 Imaging   Presented to ED with abdominal pain CT AP: IMPRESSION: 1. Surgical resection of the bladder and prostate with ileal conduit present in the right lower quadrant. 2. No evidence of metastatic disease. 3. Mild delay of nephrogram on the right without hydronephrosis, possibly renovascular disease. 4.  Aortic atherosclerosis.  CT chest: no metastases   08/23/2023 Imaging   CT AP IMPRESSION: Postsurgical changes as described above.   No acute abnormality is noted   09/12/2023 Imaging   CT chest: NED   10/13/2023 -  Chemotherapy   Patient is on Treatment Plan : BLADDER Nivolumab  (240) q14d        PHYSICAL EXAMINATION: ECOG PERFORMANCE STATUS: 1 - Symptomatic but completely ambulatory  Vitals:   12/08/23 1339 12/08/23 1348  BP: (!) 160/82 (!) 156/79  Pulse: 97   Resp: 20   Temp: (!) 97.1 F (36.2 C)   SpO2: 97%    Filed Weights   12/08/23 1339  Weight: 183 lb 3.2 oz (83.1 kg)    GENERAL: alert, no distress and comfortable SKIN: skin color normal  LYMPH:  no palpable cervical, axillary lymphadenopathy  LUNGS: clear to auscultation and percussion with normal breathing effort HEART: regular rate & rhythm  ABDOMEN: abdomen soft, non-tender and nondistended. Ostomy in place. Musculoskeletal: no edema   Relevant data reviewed during this visit included labs.  Outside records reviewed.

## 2023-12-07 NOTE — Assessment & Plan Note (Signed)
 Repeat B12 and MMA next month Continue b12

## 2023-12-07 NOTE — Assessment & Plan Note (Signed)
 Diarrhea resolved.   ?

## 2023-12-08 ENCOUNTER — Encounter

## 2023-12-08 ENCOUNTER — Other Ambulatory Visit: Payer: Self-pay

## 2023-12-08 ENCOUNTER — Inpatient Hospital Stay (HOSPITAL_BASED_OUTPATIENT_CLINIC_OR_DEPARTMENT_OTHER): Payer: Medicare (Managed Care)

## 2023-12-08 ENCOUNTER — Inpatient Hospital Stay: Payer: Medicare (Managed Care)

## 2023-12-08 ENCOUNTER — Inpatient Hospital Stay

## 2023-12-08 ENCOUNTER — Other Ambulatory Visit: Payer: Medicare (Managed Care)

## 2023-12-08 VITALS — BP 156/79 | HR 97 | Temp 97.1°F | Resp 20 | Wt 183.2 lb

## 2023-12-08 DIAGNOSIS — G894 Chronic pain syndrome: Secondary | ICD-10-CM

## 2023-12-08 DIAGNOSIS — C679 Malignant neoplasm of bladder, unspecified: Secondary | ICD-10-CM

## 2023-12-08 DIAGNOSIS — R197 Diarrhea, unspecified: Secondary | ICD-10-CM | POA: Diagnosis not present

## 2023-12-08 DIAGNOSIS — Z5112 Encounter for antineoplastic immunotherapy: Secondary | ICD-10-CM | POA: Diagnosis not present

## 2023-12-08 DIAGNOSIS — C775 Secondary and unspecified malignant neoplasm of intrapelvic lymph nodes: Secondary | ICD-10-CM

## 2023-12-08 DIAGNOSIS — Z936 Other artificial openings of urinary tract status: Secondary | ICD-10-CM

## 2023-12-08 LAB — CMP (CANCER CENTER ONLY)
ALT: 7 U/L (ref 0–44)
AST: 10 U/L — ABNORMAL LOW (ref 15–41)
Albumin: 3.6 g/dL (ref 3.5–5.0)
Alkaline Phosphatase: 57 U/L (ref 38–126)
Anion gap: 4 — ABNORMAL LOW (ref 5–15)
BUN: 19 mg/dL (ref 8–23)
CO2: 26 mmol/L (ref 22–32)
Calcium: 8.6 mg/dL — ABNORMAL LOW (ref 8.9–10.3)
Chloride: 110 mmol/L (ref 98–111)
Creatinine: 1.58 mg/dL — ABNORMAL HIGH (ref 0.61–1.24)
GFR, Estimated: 48 mL/min — ABNORMAL LOW (ref >60)
Glucose, Bld: 100 mg/dL — ABNORMAL HIGH (ref 70–99)
Potassium: 4 mmol/L (ref 3.5–5.1)
Sodium: 140 mmol/L (ref 135–145)
Total Bilirubin: 0.4 mg/dL (ref 0.0–1.2)
Total Protein: 6.4 g/dL — ABNORMAL LOW (ref 6.5–8.1)

## 2023-12-08 LAB — CBC WITH DIFFERENTIAL (CANCER CENTER ONLY)
Abs Immature Granulocytes: 0.02 K/uL (ref 0.00–0.07)
Basophils Absolute: 0 K/uL (ref 0.0–0.1)
Basophils Relative: 1 %
Eosinophils Absolute: 0.2 K/uL (ref 0.0–0.5)
Eosinophils Relative: 6 %
HCT: 40.5 % (ref 39.0–52.0)
Hemoglobin: 13.3 g/dL (ref 13.0–17.0)
Immature Granulocytes: 1 %
Lymphocytes Relative: 24 %
Lymphs Abs: 0.9 K/uL (ref 0.7–4.0)
MCH: 31.4 pg (ref 26.0–34.0)
MCHC: 32.8 g/dL (ref 30.0–36.0)
MCV: 95.5 fL (ref 80.0–100.0)
Monocytes Absolute: 0.5 K/uL (ref 0.1–1.0)
Monocytes Relative: 13 %
Neutro Abs: 2.1 K/uL (ref 1.7–7.7)
Neutrophils Relative %: 55 %
Platelet Count: 226 K/uL (ref 150–400)
RBC: 4.24 MIL/uL (ref 4.22–5.81)
RDW: 12.2 % (ref 11.5–15.5)
WBC Count: 3.7 K/uL — ABNORMAL LOW (ref 4.0–10.5)
nRBC: 0 % (ref 0.0–0.2)

## 2023-12-08 LAB — TSH: TSH: 2.87 u[IU]/mL (ref 0.350–4.500)

## 2023-12-08 LAB — T4, FREE: Free T4: 0.97 ng/dL (ref 0.61–1.12)

## 2023-12-08 MED ORDER — SODIUM CHLORIDE 0.9 % IV SOLN: INTRAVENOUS | Status: DC

## 2023-12-08 MED ORDER — OXYCODONE HCL 5 MG PO TABS
10.0000 mg | ORAL_TABLET | Freq: Once | ORAL | Status: AC
  Administered 2023-12-08: 10 mg via ORAL
  Filled 2023-12-08: qty 2

## 2023-12-08 MED ORDER — SODIUM CHLORIDE 0.9 % IV SOLN
240.0000 mg | Freq: Once | INTRAVENOUS | Status: AC
  Administered 2023-12-08: 240 mg via INTRAVENOUS
  Filled 2023-12-08: qty 24

## 2023-12-08 MED ORDER — SODIUM CHLORIDE 0.9% FLUSH
10.0000 mL | INTRAVENOUS | Status: DC | PRN
  Administered 2023-12-08: 10 mL via INTRAVENOUS

## 2023-12-08 MED ORDER — LIDOCAINE-PRILOCAINE 2.5-2.5 % EX CREA: 1.0000 | TOPICAL_CREAM | CUTANEOUS | 0 refills | Status: AC | PRN

## 2023-12-08 NOTE — Assessment & Plan Note (Addendum)
 Patient report he forgot to take his oxycodone  today.  He would like to receive oxycodone , he normally takes 10 mg twice a day.  One-time dose of oxycodone  IR ordered to be given while in infusion.

## 2023-12-08 NOTE — Patient Instructions (Signed)
 [facility] - A DEPT OF [facility]  Discharge Instructions: Thank you for choosing [facility] to provide your oncology and hematology care.   If you have a lab appointment with the Cancer Center, please go directly to the Cancer Center and check in at the registration area.   Wear comfortable clothing and clothing appropriate for easy access to any Portacath or PICC line.   We strive to give you quality time with your provider. You may need to reschedule your appointment if you arrive late (15 or more minutes).  Arriving late affects you and other patients whose appointments are after yours.  Also, if you miss three or more appointments without notifying the office, you may be dismissed from the clinic at the provider's discretion.      For prescription refill requests, have your pharmacy contact our office and allow 72 hours for refills to be completed.    Today you received the following chemotherapy and/or immunotherapy agents: Opdivo    To help prevent nausea and vomiting after your treatment, we encourage you to take your nausea medication as directed.  BELOW ARE SYMPTOMS THAT SHOULD BE REPORTED IMMEDIATELY: *FEVER GREATER THAN 100.4 F (38 C) OR HIGHER *CHILLS OR SWEATING *NAUSEA AND VOMITING THAT IS NOT CONTROLLED WITH YOUR NAUSEA MEDICATION *UNUSUAL SHORTNESS OF BREATH *UNUSUAL BRUISING OR BLEEDING *URINARY PROBLEMS (pain or burning when urinating, or frequent urination) *BOWEL PROBLEMS (unusual diarrhea, constipation, pain near the anus) TENDERNESS IN MOUTH AND THROAT WITH OR WITHOUT PRESENCE OF ULCERS (sore throat, sores in mouth, or a toothache) UNUSUAL RASH, SWELLING OR PAIN  UNUSUAL VAGINAL DISCHARGE OR ITCHING   Items with * indicate a potential emergency and should be followed up as soon as possible or go to the Emergency Department if any problems should occur.  Please show the CHEMOTHERAPY ALERT CARD or IMMUNOTHERAPY ALERT  CARD at check-in to the Emergency Department and triage nurse.  Should you have questions after your visit or need to cancel or reschedule your appointment, please contact [facility] - A DEPT OF [facility]  Dept: [phone]  and follow the prompts.  Office hours are 8:00 a.m. to 4:30 p.m. Monday - Friday. Please note that voicemails left after 4:00 p.m. may not be returned until the following business day.  We are closed weekends and major holidays. You have access to a nurse at all times for urgent questions. Please call the main number to the clinic Dept: [phone] and follow the prompts.   For any non-urgent questions, you may also contact your provider using MyChart. We now offer e-Visits for anyone 24 and older to request care online for non-urgent symptoms. For details visit [email or web address].   Also download the MyChart app! Go to the app store, search MyChart, open the app, select [facility], and log in with your MyChart username and password.

## 2023-12-09 ENCOUNTER — Telehealth: Payer: Self-pay

## 2023-12-09 NOTE — Telephone Encounter (Signed)
 [facility] Clinical Social Work  Clinical Social Work was referred by medical provider for SDOH needs.  Clinical Social Worker contacted patient by phone to offer support and assess for needs. Patient expressed need for assistance with food due to limited amount of SNAP benefits. CSW provided education on [employer] and sent additional resources via email to [id number] as requested.     Follow Up Plan:  Patient will contact CSW with any support or resource needs    Lizbeth Sprague, LCSW  Clinical Social Worker [facility]

## 2023-12-10 ENCOUNTER — Other Ambulatory Visit: Payer: Self-pay

## 2023-12-20 NOTE — Telephone Encounter (Signed)
 Patient is requesting the following refill Requested Prescriptions   Pending Prescriptions Disp Refills  . gabapentin  (NEURONTIN ) 300 MG capsule [Pharmacy Med Name: GABAPENTIN  300 MG CAPSULE] 90 capsule 0    Sig: TAKE 1 CAPSULE BY MOUTH THREE TIMES A DAY.    Recent Visits Date Type Provider Dept  11/21/23 Office Visit [name] LABOR, MD [facility]  10/24/23 Office Visit [name] LABOR, MD [facility]  06/13/23 Office Visit [name] LABOR, MD [facility]  05/23/23 Telemedicine Betti, Asberry LABOR, MD [facility]  01/26/23 Office Visit [name] LABOR, MD [facility]  Showing recent visits within past 365 days and meeting all other requirements Future Appointments No visits were found meeting these conditions. Showing future appointments within next 365 days and meeting all other requirements    Labs: Not applicable this refill

## 2023-12-21 NOTE — Progress Notes (Unsigned)
 [facility] OFFICE PROGRESS NOTE  Patient Care Team: System, Provider Not In as PCP - General  Todd [name] is a 68 y.o.male with history of colon cancer, MIBC, MLH1 mutation, COPD, back fusion with chronic back pain being follow up with for urothelial carcinoma.    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 After improvement of PS, started adjuvant nivo on 10/13/23   Todd [name] has no side effects.  He complains of living situation is not optimal.  He started to complain about that his daughter is not caring for him.  He complained that nobody is caring about him to find a new place for him and we should be finding a different place for him.     For his depression, we had referred to psychiatry and pending evaluation.  He also has chronic bilateral lower back pain pain medication through PCP.  May continue follow-up with PCP. He asked for dose of oxycodone  in the infusion today.  Initial CMP was delayed and sent to [facility].  As a result giving false result.  Results were abnormal but patient has no symptoms to suggest profound hypokalemia or hypocalcemia.  Repeat stat testing showed similar findings from 2 weeks ago and mostly normal without new concerns.  Patient has CKD at baseline.  He is tolerating treatment well.  Will transition to every 4-week dosing.  He has conflict with dentist appointment and procedure in 2 weeks.  We will change to 4-week dosage today and patient will follow-up in 4 weeks. 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 Script sent to his pharmacy Chronic pain  syndrome Patient reports forgot to take his home oxycodone  and request a dose for his chronic low back pain Oxycodone  10 mg x 1 ordered to be given the infusion.  Orders Placed This Encounter  Procedures   CBC with Differential (Cancer Center Only)    Standing Status:   Future    Expected Date:   07/05/2024    Expiration Date:   07/05/2025   CMP (Cancer Center only)    Standing Status:   Future    Expected Date:   07/05/2024    Expiration Date:   07/05/2025   T4, free    Standing Status:   Future    Expected Date:   07/05/2024    Expiration Date:   07/05/2025   CBC with Differential (Cancer Center Only)    Standing Status:   Future    Expected Date:   08/02/2024    Expiration Date:   08/02/2025   CMP (Cancer Center only)    Standing Status:   Future    Expected Date:   08/02/2024    Expiration Date:   08/02/2025   T4, free    Standing Status:   Future    Expected Date:   08/02/2024    Expiration Date:   08/02/2025     Todd [name], MD  INTERVAL HISTORY: Patient returns for follow-up.  No new side effect like coughing, rash, diarrhea.  He complained of his living situation is not optimal.  He expresses that we should be finding him a place to live.  When  asked about his family and daughter, he said they do not care about him and started to complain about them.  He said he forgot his medication today and needs pain medication in the infusion room.  He has chronic low back pain.  He follows with his primary care routinely.  He has chronic fatigue.  He cannot tell exactly if fatigue is worsened with new treatment.  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  2016-2018 He then had multiple other TURBTs with intermittent adherence/tolerability to BCG     05/27/2014 Procedure   Repeat TURBT, path again with LgTa. Only  completed 2/6 BCG treatments, then lost to follow-up.    06/09/2015 Procedure   Repeat TURBT with 3 papillary tumors, pathology with LgTa and CIS.    06/2015 Imaging   CT A/P with diffuse bladder wall thickening, no definitive evidence of metastatic disease    07/21/2015 Procedure   Repeat TURBT with path showing high grade dysplasia. Again scheduled for BCG but only completed 1-2 doses.    10/19/2016 Procedure   Repeat TURBT with low grade papillary urothelial carcinoma/CIS. Received 4/6 doses of BCG. Lost to follow-up.    03/09/2019 Imaging   CT CAP with LUL 2cm GGO, minimal right urinary bladder wall thickening of 4mm    01/22/2020 Imaging   CT A/P in the ED for N/V/D/weight loss. Bladder thickening more pronounced at 7mm.    2020-07-14 Imaging   CT urogram for hematuria showed new right lateral masslike thickening of bladder wall, measure 8mm. No evidence metastatic disease.    08/13/2020 Procedure   Repeat TURBT with invasive high-grade urothelial carcinoma with nonfocal invasion into lamina propria (CIS present), second specimen with invasive high-grade urothelial carcinoma extensively involving lamina propria, with one fragment showing involvement of muscle bundles which are equivocal for muscularis mucosae or muscularis propria (CIS again present).    09/19/2020 Procedure   Repeat TURBT for re-resection with invasive high-grade urothelial carcinoma with non-focal lamina propria invasion and CIS, no involvement of muscularis propria    11/20/2020 - 09/17/2021 Chemotherapy   C1 - 8 Pembrolizumab    12/05/2022 Imaging   MRI pelvis wo contrast -Increased size of right posterolateral bladder wall mass, now measuring up to 3.2 cm, previously 1.2 cm, with extramural extension of the mass encasing the distal right ureter and resulting in upstream right ureteral dilation. The extravesicular extension of the bladder mass also is in close contact with the vas deferens near the seminal vesicles for  which abutment or invasion cannot be excluded.     01/03/2023 Imaging   CT AP -Increasing right posterior lateral bladder wall thickening, concerning for disease progression. New right ureteral obstruction associated hydroureteronephrosis.  -Multiple subcentimeter hypoattenuating foci within the liver, favored to be benign given stability since 2016.   CT Chest: No intrathoracic metastasis with stable findings since December 28.     01/17/2023 PET scan   PET from [facility] - Thickening of the right posterolateral bladder wall, similar to prior CT dated 01/03/2023.  - Minimally FDG avid bilateral inguinal lymph nodes which do not appear pathologically enlarged or abnormal in appearance. These are favored to be reactive.  -There is a possible mildly hypermetabolic nodule along the posterior wall of the rectum which is not completely characterized on this study. Suggest correlation with colonoscopy, which patient has scheduled 01/20/2023.  02/07/2023 Pathology Results   A: Ureter margin, right distal, biopsy - Benign ureter with chronic inflammation - Negative for malignancy   B: Ureter margin, left distal, biopsy - Focal atypia, worrisome for focal involvement by urothelial carcinoma in situ   C: Bladder and prostate, cystoprostatectomy   BLADDER - Multifocal invasive high grade papillary urothelial carcinoma              - Dominant tumor is in right posterior upper bladder wall, 3.0 cm, invasive through muscularis propria with macroscopic invasion of peri-vesicle adipose tissue              - Satellite tumor in left bladder extending into left ureter, 0.9 cm, invasive into lamina propria - Extensive multifocal high grade papillary urothelial carcinoma and urothelial carcinoma in situ present in all random sections of the bladder mucosa (anterior, posterior, dome, trigone, right lateral, left lateral walls) away from invasive cancers - 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  (contiguous with carcinoma in situ of trigone), distal margin uninvolved - Margins of resection negative for invasive carcinoma - Left ureter margin of this specimen shows urothelial carcinoma in situ, superceded by additional specimens, final left ureter margin is in specimen F, which shows patchy atypia 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 - See synoptic report    PROSTATE - 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 and involves prostatic ducts - Prostatic stroma with no evidence of involvement by urothelial carcinoma, negative for prostate carcinoma - Prostatic urethral distal margin negative   D: Lymph node, right pelvic, biopsy - Five lymph nodes with no evidence of malignancy (0/5)    E: Lymph node, left pelvic, biopsy - Metastatic carcinoma present in one of five lymph nodes (1/5), size of metastatic deposit 1.4 mm, without extranodal extension   F: True ureteral margin, left distal, biopsy - 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 - Negative for invasive carcinoma   G: True ureteral margin, right distal, biopsy - Benign ureter with chronic inflammation - Negative for malignancy  SPECIMEN    Procedure:    Radical cystoprostatectomy   TUMOR    Tumor Site:    Right lateral wall     Tumor Site:    Left lateral wall     Tumor Site:    Posterior wall     Histologic Type:    Urothelial carcinoma, invasive (conventional)     Histologic Grade:    High-grade     Tumor Size:    Greatest Dimension (Centimeters): 3.0 cm       Additional Dimension (Centimeters):    2.9 cm       Additional Dimension (Centimeters):    2.8 cm     Tumor Extent:    Invades perivesical soft tissue microscopically     Lymphatic and / or Vascular Invasion:    Not identified     Tumor Configuration:    Papillary     Tumor Configuration:    Solid / nodule     Tumor Configuration:    Flat     Treatment Effect Post Neoadjuvant Chemotherapy:    Weak and  no response: residual cancer cells occupying greater than or equal to 50% of the tumor bed or absence of regressive changes (TRG3)     Tumor Comment:    Appears to have received 1 dose of pembrolizumab, no apparent histologic response.   MARGINS    Margin Status for Invasive Tumor:  All margins negative for invasive tumor       Closest Margin(s) to Invasive tumor:    Soft tissue: right peri-vesicular margin       Distance from Invasive Tumor to Closest Margin:    13 mm     Margin Status for Carcinoma in Situ / Noninvasive Papillary Urothelial Carcinoma:    Carcinoma in situ / noninvasive papillary urothelial carcinoma present at margin       Margin(s) Involved by Carcinoma in Situ / Noninvasive Papillary Urothelial Carcinoma:    Left ureteral   REGIONAL LYMPH NODES     Regional Lymph Node Status:          :    Tumor present in regional lymph node(s)         Number of Lymph Nodes with Tumor:    1         Size of Largest Nodal Metastatic Deposit:    0.14 cm         Nodal Site with Largest Metastatic Deposit:    Left pelvic         Size of Largest Lymph Node with Tumor:    0.7 cm         Largest Lymph Node with Tumor:    left pelvic         Extranodal Extension (ENE):    Not identified       Number of Lymph Nodes Examined:    10   pTNM CLASSIFICATION (AJCC 8th Edition)     Reporting of pT, pN, and (when applicable) pM categories is based on information available to the pathologist at the time the report is issued. As per the AJCC (Chapter 1, 8th Ed.) it is the managing physician's responsibility to establish the final pathologic stage based upon all pertinent information, including but potentially not limited to this pathology report.     Modified Classification:    y     pT Category:    pT3a     T Suffix:    (m)     pN Category:    pN1    02/20/2023 Procedure   Colonoscopy Impression:    - The examined portion of the ileum was normal.                         - The entire examined colon  is normal on direct and retroflexion views.                         - Views were limited by liquid stool throughout the colon and difficulty suctioning the stool.                         - No specimens collected.    02/23/2023 Imaging   CT AP Hepatobiliary: Hypodensities in liver consistent benign cysts. Postcholecystectomy. Mild extrahepatic biliary duct dilatation not changed from prior.   03/03/2023 -  Chemotherapy   First visit to [facility]. Initially planned for chemoradiation. Due to decrease PS and recurrent hospitalization, did not pursue chemotherapy. Discussed pending PS to consider adjuvant nivolumab  after radiation.     04/06/2023 Imaging   CT AP from [facility] Relative hypoenhancement of the right kidney when compared to the left which is nonspecific. This could be related to altered perfusion or infection/pyelonephritis. No significant hydronephrosis is identified. Clinical correlation is recommended.  LIVER: Normal liver contour. Subcentimeter  hypoattenuating hepatic foci too small to characterize further on CT.    05/09/2023 - 06/10/2023 Radiation Therapy   Adjuvant IMRT to bladder 45 Gy/ 25 fxs   05/10/2023 Cancer Staging   Staging form: Urinary Bladder, AJCC 8th Edition - Clinical: Stage IIIA (cT3, cN1, cM0) - Signed by Tina Todd BROCKS, MD on 05/10/2023 WHO/ISUP grade (low/high): High Grade Histologic grading system: 2 grade system   05/19/2023 Imaging   Presented to ED with abdominal pain CT AP: IMPRESSION: 1. Surgical resection of the bladder and prostate with ileal conduit present in the right lower quadrant. 2. No evidence of metastatic disease. 3. Mild delay of nephrogram on the right without hydronephrosis, possibly renovascular disease. 4. Aortic atherosclerosis.  CT chest: no metastases   08/23/2023 Imaging   CT AP IMPRESSION: Postsurgical changes as described above.   No acute abnormality is noted   09/12/2023 Imaging   CT chest: NED   10/13/2023 -   Chemotherapy   Patient is on Treatment Plan : BLADDER Nivolumab  (480) q28d        PHYSICAL EXAMINATION: ECOG PERFORMANCE STATUS: 2 - Symptomatic, <50% confined to bed  Vitals:   12/22/23 1205  BP: 131/78  Pulse: 91  Resp: 20  Temp: 97.9 F (36.6 C)  SpO2: 97%   Filed Weights   12/22/23 1205  Weight: 191 lb 6.4 oz (86.8 kg)    GENERAL: alert, no distress and comfortable SKIN: skin color normal and no rash on exposed skin LUNGS: clear to auscultation and percussion with normal breathing effort HEART: regular rate & rhythm    Relevant data reviewed during this visit included labs.

## 2023-12-22 ENCOUNTER — Inpatient Hospital Stay

## 2023-12-22 ENCOUNTER — Other Ambulatory Visit

## 2023-12-22 ENCOUNTER — Other Ambulatory Visit: Payer: Self-pay

## 2023-12-22 ENCOUNTER — Ambulatory Visit: Payer: Self-pay

## 2023-12-22 VITALS — BP 131/78 | HR 91 | Temp 97.9°F | Resp 20 | Wt 191.4 lb

## 2023-12-22 DIAGNOSIS — C679 Malignant neoplasm of bladder, unspecified: Secondary | ICD-10-CM

## 2023-12-22 DIAGNOSIS — Z79620 Long term (current) use of immunosuppressive biologic: Secondary | ICD-10-CM | POA: Diagnosis not present

## 2023-12-22 DIAGNOSIS — Z5112 Encounter for antineoplastic immunotherapy: Secondary | ICD-10-CM | POA: Diagnosis present

## 2023-12-22 DIAGNOSIS — C775 Secondary and unspecified malignant neoplasm of intrapelvic lymph nodes: Secondary | ICD-10-CM | POA: Diagnosis not present

## 2023-12-22 DIAGNOSIS — G894 Chronic pain syndrome: Secondary | ICD-10-CM | POA: Diagnosis not present

## 2023-12-22 DIAGNOSIS — C678 Malignant neoplasm of overlapping sites of bladder: Secondary | ICD-10-CM | POA: Insufficient documentation

## 2023-12-22 DIAGNOSIS — E538 Deficiency of other specified B group vitamins: Secondary | ICD-10-CM

## 2023-12-22 LAB — BASIC METABOLIC PANEL - CANCER CENTER ONLY
Anion gap: 2 — ABNORMAL LOW (ref 5–15)
BUN: 20 mg/dL (ref 8–23)
CO2: 31 mmol/L (ref 22–32)
Calcium: 8.3 mg/dL — ABNORMAL LOW (ref 8.9–10.3)
Chloride: 105 mmol/L (ref 98–111)
Creatinine: 1.69 mg/dL — ABNORMAL HIGH (ref 0.61–1.24)
GFR, Estimated: 44 mL/min — ABNORMAL LOW (ref >60)
Glucose, Bld: 109 mg/dL — ABNORMAL HIGH (ref 70–99)
Potassium: 4.2 mmol/L (ref 3.5–5.1)
Sodium: 138 mmol/L (ref 135–145)

## 2023-12-22 LAB — CBC WITH DIFFERENTIAL (CANCER CENTER ONLY)
Abs Immature Granulocytes: 0.02 K/uL (ref 0.00–0.07)
Basophils Absolute: 0 K/uL (ref 0.0–0.1)
Basophils Relative: 0 %
Eosinophils Absolute: 0.2 K/uL (ref 0.0–0.5)
Eosinophils Relative: 4 %
HCT: 39.5 % (ref 39.0–52.0)
Hemoglobin: 12.2 g/dL — ABNORMAL LOW (ref 13.0–17.0)
Immature Granulocytes: 1 %
Lymphocytes Relative: 14 %
Lymphs Abs: 0.6 K/uL — ABNORMAL LOW (ref 0.7–4.0)
MCH: 30.8 pg (ref 26.0–34.0)
MCHC: 30.9 g/dL (ref 30.0–36.0)
MCV: 99.7 fL (ref 80.0–100.0)
Monocytes Absolute: 0.5 K/uL (ref 0.1–1.0)
Monocytes Relative: 12 %
Neutro Abs: 3 K/uL (ref 1.7–7.7)
Neutrophils Relative %: 69 %
Platelet Count: 204 K/uL (ref 150–400)
RBC: 3.96 MIL/uL — ABNORMAL LOW (ref 4.22–5.81)
RDW: 12.8 % (ref 11.5–15.5)
WBC Count: 4.3 K/uL (ref 4.0–10.5)
nRBC: 0 % (ref 0.0–0.2)

## 2023-12-22 LAB — CMP (CANCER CENTER ONLY)
ALT: 5 U/L (ref 0–44)
AST: 9 U/L — ABNORMAL LOW (ref 15–41)
Albumin: 2.1 g/dL — ABNORMAL LOW (ref 3.5–5.0)
Alkaline Phosphatase: 35 U/L — ABNORMAL LOW (ref 38–126)
Anion gap: 3 — ABNORMAL LOW (ref 5–15)
BUN: 14 mg/dL (ref 8–23)
CO2: 20 mmol/L — ABNORMAL LOW (ref 22–32)
Calcium: 4.8 mg/dL — CL (ref 8.9–10.3)
Chloride: 125 mmol/L — ABNORMAL HIGH (ref 98–111)
Creatinine: 1.02 mg/dL (ref 0.61–1.24)
GFR, Estimated: >60 mL/min (ref >60)
Glucose, Bld: 68 mg/dL — ABNORMAL LOW (ref 70–99)
Potassium: 2.6 mmol/L — CL (ref 3.5–5.1)
Sodium: 148 mmol/L — ABNORMAL HIGH (ref 135–145)
Total Bilirubin: 0.2 mg/dL (ref 0.0–1.2)
Total Protein: 3.7 g/dL — ABNORMAL LOW (ref 6.5–8.1)

## 2023-12-22 MED ORDER — SODIUM CHLORIDE 0.9 % IV SOLN: INTRAVENOUS | Status: DC

## 2023-12-22 MED ORDER — OXYCODONE HCL 5 MG PO TABS
10.0000 mg | ORAL_TABLET | Freq: Once | ORAL | Status: AC
  Administered 2023-12-22: 10 mg via ORAL
  Filled 2023-12-22: qty 2

## 2023-12-22 MED ORDER — SODIUM CHLORIDE 0.9 % IV SOLN
480.0000 mg | Freq: Once | INTRAVENOUS | Status: AC
  Administered 2023-12-22: 480 mg via INTRAVENOUS
  Filled 2023-12-22: qty 48

## 2023-12-22 MED ORDER — VITAMIN B-12 1000 MCG PO TABS
1000.0000 ug | ORAL_TABLET | Freq: Every day | ORAL | 3 refills | Status: DC
Start: 2023-12-22 — End: 2024-01-19

## 2023-12-22 NOTE — Progress Notes (Signed)
 CRITICAL VALUE STICKER  CRITICAL VALUE: K=2.6, Ca=4.8  RECEIVER (on-site recipient of call): Levon Burrs, RN  DATE & TIME NOTIFIED: 12/22/23 1347  MESSENGER (representative from lab): Amber  MD NOTIFIED: Tina  TIME OF NOTIFICATION: 1348  RESPONSE: redraw STAT bmp

## 2023-12-22 NOTE — Patient Instructions (Signed)
 [facility] - A DEPT OF [facility]  Discharge Instructions: Thank you for choosing [facility] to provide your oncology and hematology care.   If you have a lab appointment with the Cancer Center, please go directly to the Cancer Center and check in at the registration area.   Wear comfortable clothing and clothing appropriate for easy access to any Portacath or PICC line.   We strive to give you quality time with your provider. You may need to reschedule your appointment if you arrive late (15 or more minutes).  Arriving late affects you and other patients whose appointments are after yours.  Also, if you miss three or more appointments without notifying the office, you may be dismissed from the clinic at the provider's discretion.      For prescription refill requests, have your pharmacy contact our office and allow 72 hours for refills to be completed.    Today you received the following chemotherapy and/or immunotherapy agents: Nivolumab.       To help prevent nausea and vomiting after your treatment, we encourage you to take your nausea medication as directed.  BELOW ARE SYMPTOMS THAT SHOULD BE REPORTED IMMEDIATELY: *FEVER GREATER THAN 100.4 F (38 C) OR HIGHER *CHILLS OR SWEATING *NAUSEA AND VOMITING THAT IS NOT CONTROLLED WITH YOUR NAUSEA MEDICATION *UNUSUAL SHORTNESS OF BREATH *UNUSUAL BRUISING OR BLEEDING *URINARY PROBLEMS (pain or burning when urinating, or frequent urination) *BOWEL PROBLEMS (unusual diarrhea, constipation, pain near the anus) TENDERNESS IN MOUTH AND THROAT WITH OR WITHOUT PRESENCE OF ULCERS (sore throat, sores in mouth, or a toothache) UNUSUAL RASH, SWELLING OR PAIN  UNUSUAL VAGINAL DISCHARGE OR ITCHING   Items with * indicate a potential emergency and should be followed up as soon as possible or go to the Emergency Department if any problems should occur.  Please show the CHEMOTHERAPY ALERT CARD or  IMMUNOTHERAPY ALERT CARD at check-in to the Emergency Department and triage nurse.  Should you have questions after your visit or need to cancel or reschedule your appointment, please contact [facility] - A DEPT OF [facility]  Dept: [phone]  and follow the prompts.  Office hours are 8:00 a.m. to 4:30 p.m. Monday - Friday. Please note that voicemails left after 4:00 p.m. may not be returned until the following business day.  We are closed weekends and major holidays. You have access to a nurse at all times for urgent questions. Please call the main number to the clinic Dept: [phone] and follow the prompts.   For any non-urgent questions, you may also contact your provider using MyChart. We now offer e-Visits for anyone 21 and older to request care online for non-urgent symptoms. For details visit [email or web address].   Also download the MyChart app! Go to the app store, search "MyChart", open the app, select [place], and log in with your MyChart username and password.

## 2023-12-22 NOTE — Progress Notes (Signed)
 Ok to tx with Scr=1.69 today. Tx frequency and dose increased to Nivo 480mg  q4weeks per Dr. Tina.   Shaheen Star, PharmD, MBA

## 2023-12-22 NOTE — Assessment & Plan Note (Signed)
 Patient reports forgot to take his home oxycodone  and request a dose for his chronic low back pain Oxycodone  10 mg x 1 ordered to be given the infusion.

## 2023-12-22 NOTE — Assessment & Plan Note (Addendum)
 B12 1000 mcg daily. Script sent to his pharmacy

## 2023-12-22 NOTE — Assessment & Plan Note (Addendum)
 Resume treatment this week  return in 4 weeks with lab, visit and then infusion Repeat CT about end of Oct

## 2023-12-23 ENCOUNTER — Telehealth: Payer: Self-pay

## 2023-12-23 ENCOUNTER — Encounter: Payer: Self-pay | Admitting: General Practice

## 2023-12-23 NOTE — Progress Notes (Signed)
 [facility] Spiritual Care Note  Due to schedule conflicts, followed up with Mr Todd [name] by phone. He states that he is working on prioritizing his needs. His top concern remains finding housing that he feels better about. Additionally he states that he thinks depression is making the back pain worse, so he is hopeful that the psych referral may lead to easing of multiple symptoms. He appreciates being checked on, so we plan to follow up by phone if schedules continue to conflict in person.  [address] [place], [place], [facility] Pager [phone] Voicemail [phone]

## 2023-12-23 NOTE — Telephone Encounter (Signed)
 Scheduled appointments per WQ. Talked with the patient and he is aware of all made appointments.

## 2023-12-25 ENCOUNTER — Other Ambulatory Visit: Payer: Self-pay

## 2024-01-05 ENCOUNTER — Inpatient Hospital Stay

## 2024-01-05 ENCOUNTER — Ambulatory Visit

## 2024-01-05 ENCOUNTER — Other Ambulatory Visit

## 2024-01-06 ENCOUNTER — Encounter: Payer: Self-pay | Admitting: General Practice

## 2024-01-06 NOTE — Progress Notes (Signed)
 [facility] Spiritual Care Note  Attempted follow-up phone call, but voicemail full. Plan to follow up in infusion instead.  [address] [place], [place], [facility] Pager [phone] Voicemail [phone]

## 2024-01-11 ENCOUNTER — Other Ambulatory Visit: Payer: Self-pay

## 2024-01-16 NOTE — Telephone Encounter (Signed)
 Patient is requesting the following refill Requested Prescriptions   Pending Prescriptions Disp Refills  . oxyCODONE  (ROXICODONE ) 10 mg immediate release tablet 120 tablet 0    Sig: Take 1 tablet (10 mg total) by mouth every six (6) hours as needed for pain.  . busPIRone (BUSPAR) 10 MG tablet 270 tablet 1    Sig: Take 1 tablet (10 mg total) by mouth Three (3) times a day.    Last refill given on: 12/21/23 with 120 count and 0 refills.   Recent Visits Date Type Provider Dept  11/21/23 Office Visit [name] LABOR, MD [facility]  10/24/23 Office Visit [name] LABOR, MD [facility]  06/13/23 Office Visit [name] LABOR, MD [facility]  05/23/23 Telemedicine Betti, Asberry LABOR, MD [facility]  01/26/23 Office Visit [name] LABOR, MD [facility]  Showing recent visits within past 365 days and meeting all other requirements Future Appointments Date Type Provider Dept  02/20/24 Appointment [name] LABOR, MD [facility]  Showing future appointments within next 365 days and meeting all other requirements    Opioid Monitoring  Urine Tox Screen Last Drug Screen Date: 06/13/2023 Opiate Confirmation Test Last Drug Screen Date: 06/15/2023 Last Opioid Dispensed Provider: Asberry LABOR Betti, MD Prescribed MEDD : 90 Last PDMP Review: 10/24/2023 12:21 PM Last Opioid Pain Agreement Signed Date: 10/25/2023 Last Non Opioid Controlled Substance Pain Agreement Signed Date: Not Found  Naloxone  Ordered: 11/21/2023 Last OV: 11/21/2023

## 2024-01-16 NOTE — Addendum Note (Signed)
 Addended byBETHA GILLERMINA PLANAS on: 01/16/2024 04:12 PM  Modules accepted: Orders

## 2024-01-17 NOTE — Progress Notes (Unsigned)
 [facility] OFFICE PROGRESS NOTE  Patient Care Team: System, Provider Not In as PCP - General  Todd [name] is a 68 y.o.male with history of colon cancer, MIBC, MLH1 mutation, COPD, back fusion with chronic back pain being follow up with for urothelial carcinoma.    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 After improvement of PS, started adjuvant nivolumab  on 10/13/23   Todd [name] has no side effects.  Discussed that we will repeat his CT scan restaging after the cycle to make sure there is no signs of recurrence.  Report of intermittent falling with history of CVA.  He has no physical therapy, so we will place a referral.  Report he has not follow-up with GI for colonoscopy.  Referral placed.  Provide he has not received B12 prescription.  Prescription sent again today.  Report he has 1 year follow-up at [facility] urology coming up.  Recommend him to follow-up as scheduled. 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 Report he has not seen GI.  Referral made for colonoscopy.  Low serum vitamin B12 Report he has not received the prescription.  We will send again today. History of stroke Report of intermittent falls.  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  Referral to outpatient PT  Orders Placed This Encounter  Procedures   CT CHEST ABDOMEN PELVIS W CONTRAST     Standing Status:   Future    Expected Date:   02/16/2024    Expiration Date:   01/14/2025    If indicated for the ordered procedure, I authorize the administration of contrast media per Radiology protocol:   Yes    Does the patient have a contrast media/X-ray dye allergy?:   No    Preferred imaging location?:   [facility]    If indicated for the ordered procedure, I authorize the administration of oral contrast media per Radiology protocol:   Yes   Vitamin B12    Standing Status:   Future    Expected Date:   02/16/2024    Expiration Date:   02/15/2025   Vitamin B12    Standing Status:   Future    Expected Date:   06/07/2024    Expiration Date:   06/07/2025   Ambulatory referral to Gastroenterology    Referral Priority:   Routine    Referral Type:   Consultation    Referral Reason:   Specialty Services Required    Number of Visits Requested:   1   Ambulatory referral to Physical Therapy    Referral Priority:   Routine    Referral Type:   Physical Medicine    Referral Reason:   Specialty Services Required    Requested Specialty:   Physical Therapy    Number of Visits Requested:   1     Todd [name], MD  INTERVAL HISTORY: Patient returns for follow-up.  Recently had multiple teeth removed.  Eating has not been back to  normal. He denies any diarrhea, bloody stool, coughing, shortness of breath, rash. Ostomy is working.  Some tinge of blood on exchanging today.  He has follow-up with [name] next month.  Report of intermittent falling with history of CVA.  He has no physical therapy.  Report he has not follow-up with GI for colonoscopy.    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  2016-2018 He then had multiple other TURBTs with intermittent adherence/tolerability to BCG     05/27/2014 Procedure   Repeat  TURBT, path again with LgTa. Only completed 2/6 BCG treatments, then lost to follow-up.    06/09/2015 Procedure   Repeat TURBT with 3 papillary tumors, pathology with LgTa and CIS.    06/2015 Imaging   CT A/P with diffuse bladder wall thickening, no definitive evidence of metastatic disease    07/21/2015 Procedure   Repeat TURBT with path showing high grade dysplasia. Again scheduled for BCG but only completed 1-2 doses.    10/19/2016 Procedure   Repeat TURBT with low grade papillary urothelial carcinoma/CIS. Received 4/6 doses of BCG. Lost to follow-up.    03/09/2019 Imaging   CT CAP with LUL 2cm GGO, minimal right urinary bladder wall thickening of 4mm    01/22/2020 Imaging   CT A/P in the ED for N/V/D/weight loss. Bladder thickening more pronounced at 7mm.    07/08/20 Imaging   CT urogram for hematuria showed new right lateral masslike thickening of bladder wall, measure 8mm. No evidence metastatic disease.    08/13/2020 Procedure   Repeat TURBT with invasive high-grade urothelial carcinoma with nonfocal invasion into lamina propria (CIS present), second specimen with invasive high-grade urothelial carcinoma extensively involving lamina propria, with one fragment showing involvement of muscle bundles which are equivocal for muscularis mucosae or muscularis propria (CIS again present).    09/19/2020 Procedure   Repeat TURBT for re-resection with invasive high-grade urothelial carcinoma with non-focal lamina propria invasion and CIS, no involvement of muscularis propria    11/20/2020 - 09/17/2021 Chemotherapy   C1 - 8 Pembrolizumab    12/05/2022 Imaging   MRI pelvis wo contrast -Increased size of right posterolateral bladder wall mass, now measuring up to 3.2 cm, previously 1.2 cm, with extramural extension of the mass encasing the distal right ureter and resulting in upstream right ureteral dilation. The extravesicular extension of the bladder mass also is in close contact with the vas  deferens near the seminal vesicles for which abutment or invasion cannot be excluded.     01/03/2023 Imaging   CT AP -Increasing right posterior lateral bladder wall thickening, concerning for disease progression. New right ureteral obstruction associated hydroureteronephrosis.  -Multiple subcentimeter hypoattenuating foci within the liver, favored to be benign given stability since 2016.   CT Chest: No intrathoracic metastasis with stable findings since December 28.     01/17/2023 PET scan   PET from [facility] - Thickening of the right posterolateral bladder wall, similar to prior CT dated 01/03/2023.  - Minimally FDG avid bilateral inguinal lymph nodes which do not appear pathologically enlarged or abnormal in appearance. These are favored to be reactive.  -There is a possible mildly hypermetabolic nodule along the posterior wall of the rectum which is not completely characterized on this study. Suggest correlation with colonoscopy, which patient has scheduled 01/20/2023.    02/07/2023 Pathology Results   A: Ureter margin,  right distal, biopsy - Benign ureter with chronic inflammation - Negative for malignancy   B: Ureter margin, left distal, biopsy - Focal atypia, worrisome for focal involvement by urothelial carcinoma in situ   C: Bladder and prostate, cystoprostatectomy   BLADDER - Multifocal invasive high grade papillary urothelial carcinoma              - Dominant tumor is in right posterior upper bladder wall, 3.0 cm, invasive through muscularis propria with macroscopic invasion of peri-vesicle adipose tissue              - Satellite tumor in left bladder extending into left ureter, 0.9 cm, invasive into lamina propria - Extensive multifocal high grade papillary urothelial carcinoma and urothelial carcinoma in situ present in all random sections of the bladder mucosa (anterior, posterior, dome, trigone, right lateral, left lateral walls) away from invasive cancers - Urothelial  carcinoma in situ involves prostatic urethra (contiguous with carcinoma in situ of trigone), distal margin uninvolved - Margins of resection negative for invasive carcinoma - Left ureter margin of this specimen shows urothelial carcinoma in situ, superceded by additional specimens, final left ureter margin is in specimen F, which shows patchy atypia 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 - See synoptic report    PROSTATE - 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 and involves prostatic ducts - Prostatic stroma with no evidence of involvement by urothelial carcinoma, negative for prostate carcinoma - Prostatic urethral distal margin negative   D: Lymph node, right pelvic, biopsy - Five lymph nodes with no evidence of malignancy (0/5)    E: Lymph node, left pelvic, biopsy - Metastatic carcinoma present in one of five lymph nodes (1/5), size of metastatic deposit 1.4 mm, without extranodal extension   F: True ureteral margin, left distal, biopsy - 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 - Negative for invasive carcinoma   G: True ureteral margin, right distal, biopsy - Benign ureter with chronic inflammation - Negative for malignancy  SPECIMEN    Procedure:    Radical cystoprostatectomy   TUMOR    Tumor Site:    Right lateral wall     Tumor Site:    Left lateral wall     Tumor Site:    Posterior wall     Histologic Type:    Urothelial carcinoma, invasive (conventional)     Histologic Grade:    High-grade     Tumor Size:    Greatest Dimension (Centimeters): 3.0 cm       Additional Dimension (Centimeters):    2.9 cm       Additional Dimension (Centimeters):    2.8 cm     Tumor Extent:    Invades perivesical soft tissue microscopically     Lymphatic and / or Vascular Invasion:    Not identified     Tumor Configuration:    Papillary     Tumor Configuration:    Solid / nodule     Tumor Configuration:    Flat     Treatment  Effect Post Neoadjuvant Chemotherapy:    Weak and no response: residual cancer cells occupying greater than or equal to 50% of the tumor bed or absence of regressive changes (TRG3)     Tumor Comment:    Appears to have received 1 dose of pembrolizumab, no apparent histologic response.   MARGINS    Margin Status for Invasive Tumor:    All margins negative for invasive tumor  Closest Margin(s) to Invasive tumor:    Soft tissue: right peri-vesicular margin       Distance from Invasive Tumor to Closest Margin:    13 mm     Margin Status for Carcinoma in Situ / Noninvasive Papillary Urothelial Carcinoma:    Carcinoma in situ / noninvasive papillary urothelial carcinoma present at margin       Margin(s) Involved by Carcinoma in Situ / Noninvasive Papillary Urothelial Carcinoma:    Left ureteral   REGIONAL LYMPH NODES     Regional Lymph Node Status:          :    Tumor present in regional lymph node(s)         Number of Lymph Nodes with Tumor:    1         Size of Largest Nodal Metastatic Deposit:    0.14 cm         Nodal Site with Largest Metastatic Deposit:    Left pelvic         Size of Largest Lymph Node with Tumor:    0.7 cm         Largest Lymph Node with Tumor:    left pelvic         Extranodal Extension (ENE):    Not identified       Number of Lymph Nodes Examined:    10   pTNM CLASSIFICATION (AJCC 8th Edition)     Reporting of pT, pN, and (when applicable) pM categories is based on information available to the pathologist at the time the report is issued. As per the AJCC (Chapter 1, 8th Ed.) it is the managing physician's responsibility to establish the final pathologic stage based upon all pertinent information, including but potentially not limited to this pathology report.     Modified Classification:    y     pT Category:    pT3a     T Suffix:    (m)     pN Category:    pN1    02/20/2023 Procedure   Colonoscopy Impression:    - The examined portion of the ileum was normal.                          - The entire examined colon is normal on direct and retroflexion views.                         - Views were limited by liquid stool throughout the colon and difficulty suctioning the stool.                         - No specimens collected.    02/23/2023 Imaging   CT AP Hepatobiliary: Hypodensities in liver consistent benign cysts. Postcholecystectomy. Mild extrahepatic biliary duct dilatation not changed from prior.   03/03/2023 -  Chemotherapy   First visit to [facility]. Initially planned for chemoradiation. Due to decrease PS and recurrent hospitalization, did not pursue chemotherapy. Discussed pending PS to consider adjuvant nivolumab  after radiation.     04/06/2023 Imaging   CT AP from [facility] Relative hypoenhancement of the right kidney when compared to the left which is nonspecific. This could be related to altered perfusion or infection/pyelonephritis. No significant hydronephrosis is identified. Clinical correlation is recommended.  LIVER: Normal liver contour. Subcentimeter hypoattenuating hepatic foci too small to characterize further on CT.  05/09/2023 - 06/10/2023 Radiation Therapy   Adjuvant IMRT to bladder 45 Gy/ 25 fxs   05/10/2023 Cancer Staging   Staging form: Urinary Bladder, AJCC 8th Edition - Clinical: Stage IIIA (cT3, cN1, cM0) - Signed by Tina Todd BROCKS, MD on 05/10/2023 WHO/ISUP grade (low/high): High Grade Histologic grading system: 2 grade system   05/19/2023 Imaging   Presented to ED with abdominal pain CT AP: IMPRESSION: 1. Surgical resection of the bladder and prostate with ileal conduit present in the right lower quadrant. 2. No evidence of metastatic disease. 3. Mild delay of nephrogram on the right without hydronephrosis, possibly renovascular disease. 4. Aortic atherosclerosis.  CT chest: no metastases   08/23/2023 Imaging   CT AP IMPRESSION: Postsurgical changes as described above.   No acute abnormality is noted    09/12/2023 Imaging   CT chest: NED   10/13/2023 -  Chemotherapy   Patient is on Treatment Plan : BLADDER Nivolumab  (480) q28d        PHYSICAL EXAMINATION: ECOG PERFORMANCE STATUS: 2 - Symptomatic, <50% confined to bed  Vitals:   01/19/24 1348  BP: 121/72  Pulse: 86  Resp: 17  Temp: 98 F (36.7 C)  SpO2: 99%   Filed Weights   01/19/24 1348  Weight: 187 lb 12.8 oz (85.2 kg)    GENERAL: alert, no distress and comfortable using a walker LUNGS: clear to auscultation and no wheeze or rales with normal breathing effort HEART: regular rate & rhythm  ABDOMEN: Ostomy in place clear urine NEURO: no focal motor/sensory deficits  Relevant data reviewed during this visit included labs.  New labs and imaging ordered.

## 2024-01-17 NOTE — Addendum Note (Signed)
 Addended by: BETTI STABS A on: 01/17/2024 07:54 AM  Modules accepted: Orders

## 2024-01-18 NOTE — Telephone Encounter (Signed)
 Pt has moved and requested his prescription for oxycodone  be sent to the [facility] in [facility] instead? Could you cancel the previous prescription and send one to [facility]?

## 2024-01-19 ENCOUNTER — Inpatient Hospital Stay

## 2024-01-19 ENCOUNTER — Encounter: Payer: Self-pay | Admitting: General Practice

## 2024-01-19 ENCOUNTER — Encounter

## 2024-01-19 ENCOUNTER — Other Ambulatory Visit

## 2024-01-19 VITALS — BP 121/72 | HR 86 | Temp 98.0°F | Resp 17 | Ht 70.0 in | Wt 187.8 lb

## 2024-01-19 DIAGNOSIS — C775 Secondary and unspecified malignant neoplasm of intrapelvic lymph nodes: Secondary | ICD-10-CM | POA: Diagnosis not present

## 2024-01-19 DIAGNOSIS — C678 Malignant neoplasm of overlapping sites of bladder: Secondary | ICD-10-CM | POA: Insufficient documentation

## 2024-01-19 DIAGNOSIS — Z79620 Long term (current) use of immunosuppressive biologic: Secondary | ICD-10-CM | POA: Diagnosis not present

## 2024-01-19 DIAGNOSIS — C679 Malignant neoplasm of bladder, unspecified: Secondary | ICD-10-CM

## 2024-01-19 DIAGNOSIS — Z5112 Encounter for antineoplastic immunotherapy: Secondary | ICD-10-CM | POA: Insufficient documentation

## 2024-01-19 DIAGNOSIS — Z1507 Genetic susceptibility to malignant neoplasm of urinary tract: Secondary | ICD-10-CM

## 2024-01-19 DIAGNOSIS — Z1509 Genetic susceptibility to other malignant neoplasm: Secondary | ICD-10-CM | POA: Insufficient documentation

## 2024-01-19 DIAGNOSIS — Z15060 Genetic susceptibility to colorectal cancer: Secondary | ICD-10-CM | POA: Insufficient documentation

## 2024-01-19 DIAGNOSIS — N1832 Chronic kidney disease, stage 3b: Secondary | ICD-10-CM

## 2024-01-19 DIAGNOSIS — Z936 Other artificial openings of urinary tract status: Secondary | ICD-10-CM | POA: Diagnosis not present

## 2024-01-19 DIAGNOSIS — Z8673 Personal history of transient ischemic attack (TIA), and cerebral infarction without residual deficits: Secondary | ICD-10-CM | POA: Insufficient documentation

## 2024-01-19 DIAGNOSIS — E538 Deficiency of other specified B group vitamins: Secondary | ICD-10-CM

## 2024-01-19 DIAGNOSIS — Z15068 Genetic susceptibility to other malignant neoplasm of digestive system: Secondary | ICD-10-CM

## 2024-01-19 LAB — CBC WITH DIFFERENTIAL (CANCER CENTER ONLY)
Abs Immature Granulocytes: 0.01 K/uL (ref 0.00–0.07)
Basophils Absolute: 0 K/uL (ref 0.0–0.1)
Basophils Relative: 1 %
Eosinophils Absolute: 0.2 K/uL (ref 0.0–0.5)
Eosinophils Relative: 5 %
HCT: 38.5 % — ABNORMAL LOW (ref 39.0–52.0)
Hemoglobin: 13.1 g/dL (ref 13.0–17.0)
Immature Granulocytes: 0 %
Lymphocytes Relative: 20 %
Lymphs Abs: 0.6 K/uL — ABNORMAL LOW (ref 0.7–4.0)
MCH: 32 pg (ref 26.0–34.0)
MCHC: 34 g/dL (ref 30.0–36.0)
MCV: 93.9 fL (ref 80.0–100.0)
Monocytes Absolute: 0.4 K/uL (ref 0.1–1.0)
Monocytes Relative: 11 %
Neutro Abs: 2.1 K/uL (ref 1.7–7.7)
Neutrophils Relative %: 63 %
Platelet Count: 251 K/uL (ref 150–400)
RBC: 4.1 MIL/uL — ABNORMAL LOW (ref 4.22–5.81)
RDW: 12.4 % (ref 11.5–15.5)
WBC Count: 3.2 K/uL — ABNORMAL LOW (ref 4.0–10.5)
nRBC: 0 % (ref 0.0–0.2)

## 2024-01-19 LAB — CMP (CANCER CENTER ONLY)
ALT: 6 U/L (ref 0–44)
AST: 11 U/L — ABNORMAL LOW (ref 15–41)
Albumin: 3.7 g/dL (ref 3.5–5.0)
Alkaline Phosphatase: 57 U/L (ref 38–126)
Anion gap: 4 — ABNORMAL LOW (ref 5–15)
BUN: 24 mg/dL — ABNORMAL HIGH (ref 8–23)
CO2: 30 mmol/L (ref 22–32)
Calcium: 9.2 mg/dL (ref 8.9–10.3)
Chloride: 107 mmol/L (ref 98–111)
Creatinine: 1.68 mg/dL — ABNORMAL HIGH (ref 0.61–1.24)
GFR, Estimated: 44 mL/min — ABNORMAL LOW (ref >60)
Glucose, Bld: 108 mg/dL — ABNORMAL HIGH (ref 70–99)
Potassium: 4.1 mmol/L (ref 3.5–5.1)
Sodium: 141 mmol/L (ref 135–145)
Total Bilirubin: 0.5 mg/dL (ref 0.0–1.2)
Total Protein: 6.8 g/dL (ref 6.5–8.1)

## 2024-01-19 LAB — T4, FREE: Free T4: 0.93 ng/dL (ref 0.61–1.12)

## 2024-01-19 MED ORDER — SODIUM CHLORIDE 0.9% FLUSH: 10.0000 mL | INTRAVENOUS | Status: DC | PRN

## 2024-01-19 MED ORDER — VITAMIN B-12 1000 MCG PO TABS: 1000.0000 ug | ORAL_TABLET | Freq: Every day | ORAL | 3 refills | Status: DC

## 2024-01-19 MED ORDER — SODIUM CHLORIDE 0.9 % IV SOLN: INTRAVENOUS | Status: DC

## 2024-01-19 MED ORDER — SODIUM CHLORIDE 0.9 % IV SOLN
480.0000 mg | Freq: Once | INTRAVENOUS | Status: AC
  Administered 2024-01-19: 480 mg via INTRAVENOUS
  Filled 2024-01-19: qty 48

## 2024-01-19 NOTE — Assessment & Plan Note (Addendum)
 MLH1 positive Lynch syndrome Recommend testing first degree family Daughter report having tested. Report he has not seen GI.  Referral made for colonoscopy.

## 2024-01-19 NOTE — Progress Notes (Signed)
 [facility] Spiritual Care Note  Followed up with Todd [name] in infusion, learning about his recent change in living situation, which, although it comes with its own challenges, represents meeting one of his biggest goals for himself. Provided compassionate presence, affirmation of strengths, emotional support, and hospitality. Todd [name] is aware of ongoing Spiritual Care availability for support.  [address] [place], [place], [facility] Pager [phone] Voicemail [phone]

## 2024-01-19 NOTE — Assessment & Plan Note (Addendum)
 Resume treatment this week  return in 4 weeks with lab, visit and then infusion Repeat CT about end of Oct

## 2024-01-19 NOTE — Assessment & Plan Note (Addendum)
 Fluid goal 60-70 oz per day Monitor lab each visit for potential irAE

## 2024-01-19 NOTE — Assessment & Plan Note (Addendum)
 Report of intermittent falls.  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  Referral to outpatient PT

## 2024-01-19 NOTE — Assessment & Plan Note (Signed)
 Report he has not received the prescription.  We will send again today.

## 2024-01-19 NOTE — Assessment & Plan Note (Addendum)
 Repeat B12 and MMA next month

## 2024-01-20 NOTE — Progress Notes (Deleted)
 Psychiatric Initial Adult Assessment   Patient Identification: Todd [name]. MRN:  [id number] Date of Evaluation:  01/20/2024 Referral Source: Primary care provider Chief Complaint:  No chief complaint on file.  Visit Diagnosis: No diagnosis found.   Assessment:  Todd [name]. is a 68 y.o. male with a history of *** who presents in person to [facility] at [facility] for initial evaluation on 01/20/2024.    At initial evaluation patient reports ***  A number of assessments were performed during the evaluation today including  PHQ-9 which they scored a *** on, GAD-7 which they scored a *** on, and [place] suicide severity screening which showed ***.    Risk Assessment: A suicide and violence risk assessment was performed as part of this evaluation. There patient is deemed to be at chronic elevated risk for self-harm/suicide given the following factors: {SABSUICIDERISKFACTORS:29780}. These risk factors are mitigated by the following factors: {SABSUICIDEPROTECTIVEFACTORS:29779}. The patient is deemed to be at chronic elevated risk for violence given the following factors: {SABVIOLENCERISKFACTORS:29781}. These risk factors are mitigated by the following factors: {SABVIOLENCEPROTECTIVEFACTORS:29782}. There is no *** acute risk for suicide or violence at this time. The patient was educated about relevant modifiable risk factors including following recommendations for treatment of psychiatric illness and abstaining from substance abuse.  While future psychiatric events cannot be accurately predicted, the patient does not *** currently require  acute inpatient psychiatric care and does not *** currently meet [place]  involuntary commitment criteria.  Patient was given contact information for crisis resources, behavioral health clinic and was instructed to call 911 for emergencies.    Plan: # *** Past medication trials:  Status of problem: *** Interventions: --  ***  # *** Past medication trials:  Status of problem: *** Interventions: -- ***  # *** Past medication trials:  Status of problem: *** Interventions: -- ***   History of Present Illness:  ***  Associated Signs/Symptoms: Depression Symptoms:  {DEPRESSION SYMPTOMS:20000} (Hypo) Manic Symptoms:  {BHH MANIC SYMPTOMS:22872} Anxiety Symptoms:  {BHH ANXIETY SYMPTOMS:22873} Psychotic Symptoms:  {BHH PSYCHOTIC SYMPTOMS:22874} PTSD Symptoms: {BHH PTSD SYMPTOMS:22875}  Past Psychiatric History:  Past psychiatric diagnoses: *** Psychiatric hospitalizations:*** Past suicide attempts: *** Hx of self harm: *** Hx of violence towards others: *** Prior psychiatric providers: *** Prior therapy: *** Access to firearms: ***  Prior medication trials: ***  Substance use: ***  Past Medical History:  Past Medical History:  Diagnosis Date   Allergy    Anxiety    Arthritis    Asthma    Bladder cancer (HCC) dx'd 10/2013   recurrent   Chronic gastritis    Chronic low back pain    Colon cancer (HCC) dx'd 09/2011   COPD (chronic obstructive pulmonary disease) (HCC)    Depression    Diverticulosis    Dysuria    GERD (gastroesophageal reflux disease)    History of acute pancreatitis    due to SIRS   History of adenomatous polyp of colon    04-04-2015  last colonoscopy w/ multiple polyps--  tubular adenomas, beign polypoid colonic mucous   History of bladder cancer monitory by  dr Ceil   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   History of colon cancer oncologist-- dr cloretta--  dx  with Hereditary non-polyposis colon cancer syndrome,  MLH1 gene mutation--     dx june 2013--  stage II, T3,N0--  s/p  transverse colectomy--  completed 2 cycles  chemotherapy--  NO RECURRENCE in clinical remission  Hypertension    Lactose intolerance    Lower urinary tract symptoms (LUTS)    Lynch syndrome    MLH1 gene mutation    Short of breath on exertion    Wears glasses      Past Surgical History:  Procedure Laterality Date   CHOLECYSTECTOMY  2013   COLON SURGERY  06/ 2013   Transverse   COLONOSCOPY N/A 12/04/2013   Procedure: COLONOSCOPY;  Surgeon: Lamar JONETTA Aho, MD;  Location: [facility];  Service: Endoscopy;  Laterality: N/A;   COLONOSCOPY WITH ESOPHAGOGASTRODUODENOSCOPY (EGD)  last one 04-04-2015   polypecotmy's gastric and colon    CYSTOSCOPY W/ RETROGRADES Bilateral 06/06/2015   Procedure: BILATERAL  RETROGRADE PYELOGRAM;  Surgeon: Mark Ottelin, MD;  Location: [facility];  Service: Urology;  Laterality: Bilateral;   ERCP N/A 01/01/2014   Procedure: ENDOSCOPIC RETROGRADE CHOLANGIOPANCREATOGRAPHY (ERCP);  Surgeon: Lamar JONETTA Aho, MD;  Location: [facility];  Service: Endoscopy;  Laterality: N/A;  with spyglass - preprocedure ATB's   IR IMAGING GUIDED PORT INSERTION  11/14/2023   LUMBAR DISC SURGERY  x4   last one 2001   fusion L5 -- S1   SPYGLASS CHOLANGIOSCOPY N/A 01/01/2014   Procedure: SPYGLASS CHOLANGIOSCOPY;  Surgeon: Lamar JONETTA Aho, MD;  Location: [facility];  Service: Endoscopy;  Laterality: N/A;   TONSILLECTOMY  age 22   TRANSURETHRAL RESECTION OF BLADDER TUMOR WITH GYRUS (TURBT-GYRUS) N/A 10/26/2013   Procedure: TRANSURETHRAL RESECTION OF BLADDER TUMOR WITH GYRUS (TURBT-GYRUS) ;  Surgeon: Mark C Ottelin, MD;  Location: [facility];  Service: Urology;  Laterality: N/A;   TRANSURETHRAL RESECTION OF BLADDER TUMOR WITH GYRUS (TURBT-GYRUS) N/A 05/27/2014   Procedure: TRANSURETHRAL RESECTION OF BLADDER TUMOR  AND TRANSURETHERAL BIOPSY OF PROSTATE;  Surgeon: Mark C Ottelin, MD;  Location: [facility] ;  Service: Urology;  Laterality: N/A;   TRANSURETHRAL RESECTION OF BLADDER TUMOR WITH GYRUS (TURBT-GYRUS) N/A 06/06/2015   Procedure: TRANSURETHRAL RESECTION OF BLADDER TUMOR WITH GYRUS (TURBT-GYRUS);  Surgeon: Mark Ottelin, MD;  Location: [facility];  Service: Urology;  Laterality: N/A;    Family Psychiatric  History: ***  Family History:  Family History  Problem Relation Age of Onset   Colon cancer Brother        x2   Rectal cancer Brother    Colon cancer Sister    Colon cancer Maternal Uncle    Colon cancer Cousin        x 2   Breast cancer Maternal Grandmother    Colon polyps Daughter        x 3   Diabetes Father    Stroke Father    Esophageal cancer Neg Hx    Liver cancer Neg Hx    Pancreatic cancer Neg Hx    Stomach cancer Neg Hx     Social History:   Social History   Socioeconomic History   Marital status: Single    Spouse name: Not on file   Number of children: 6   Years of education: Not on file   Highest education level: Not on file  Occupational History   Occupation: disabled  Tobacco Use   Smoking status: Every Day    Current packs/day: 0.50    Average packs/day: 0.5 packs/day for 20.0 years (10.0 ttl pk-yrs)    Types: Cigarettes, E-cigarettes   Smokeless tobacco: Never   Tobacco comments:    hx 1PPD smoker quit jan 2015 for 20 yrs.  Pt has cut back to 1 pk /wk  Vaping Use   Vaping status: Never Used  Substance and Sexual Activity   Alcohol use: No   Drug use: No   Sexual activity: Not Currently  Other Topics Concern   Not on file  Social History Narrative   ** Merged History Encounter **       Social Drivers of Health   Financial Resource Strain: Medium Risk (06/25/2023)   Received from [facility]   Overall Financial Resource Strain (CARDIA)    Difficulty of Paying Living Expenses: Somewhat hard  Food Insecurity: No Food Insecurity (11/21/2023)   [name]   Hunger Vital Sign    Within the past 12 months, you worried that your food would run out before you got the money to buy more.: Never true    Within the past 12 months, the food you bought just didn't last and you didn't have money to get more.: Never true  Recent Concern: Food Insecurity - Food Insecurity Present (10/21/2023)   Hunger Vital Sign    Worried About  Running Out of Food in the Last Year: Sometimes true    Ran Out of Food in the Last Year: Sometimes true  Transportation Needs: No Transportation Needs (11/21/2023)   Received from [facility]   PRAPARE - Transportation    Lack of Transportation (Medical): No    Lack of Transportation (Non-Medical): No  Physical Activity: Inactive (12/28/2021)   Received from [facility]   Exercise Vital Sign    On average, how many days per week do you engage in moderate to strenuous exercise (like a brisk walk)?: 0 days    On average, how many minutes do you engage in exercise at this level?: 0 min  Stress: Stress Concern Present (06/25/2023)   Received from [facility] - Occupational Stress Questionnaire    Feeling of Stress : Very much  Social Connections: Moderately Isolated (12/28/2021)   Received from [facility]   Social Connection and Isolation Panel    In a typical week, how many times do you talk on the phone with family, friends, or neighbors?: More than three times a week    How often do you get together with friends or relatives?: More than three times a week    How often do you attend church or religious services?: Never    Do you belong to any clubs or organizations such as church groups, unions, fraternal or athletic groups, or school groups?: Yes    How often do you attend meetings of the clubs or organizations you belong to?: Never    Are you married, widowed, divorced, separated, never married, or living with a partner?: Divorced    Additional Social History: ***  Allergies:   Allergies  Allergen Reactions   Clindamycin /Lincomycin Hives, Itching, Rash and Other (See Comments)    Skin peeled off hands   Milk-Related Compounds Nausea And Vomiting and Other (See Comments)    Lactose intolerant   Hydrocodone Itching and Other (See Comments)    feet and hands itch    Metabolic Disorder Labs: No results found for:  HGBA1C, MPG No results found for: PROLACTIN No results found for: CHOL, TRIG, HDL, CHOLHDL, VLDL, LDLCALC Lab Results  Component Value Date   TSH 2.870 12/08/2023    Therapeutic Level Labs: No results found for: LITHIUM No results found for: CBMZ No results found for: VALPROATE  Current Medications: Current Outpatient Medications  Medication Sig Dispense Refill   acetaminophen  (TYLENOL ) 500 MG tablet Take 1,000 mg by mouth in the morning, at noon, and at bedtime.     albuterol (PROVENTIL HFA;VENTOLIN HFA) 108 (90 BASE) MCG/ACT inhaler Inhale 1 puff into the lungs every 6 (six) hours as needed for wheezing or shortness of breath.     aspirin 81 MG chewable tablet Chew 81 mg by mouth daily.     atorvastatin (LIPITOR) 80 MG tablet Take 1 tablet by mouth daily.     bisacodyl (DULCOLAX) 10 MG suppository Place 10 mg rectally daily as needed for moderate constipation.     budesonide-formoterol (SYMBICORT) 160-4.5 MCG/ACT inhaler Inhale 2 puffs into the lungs 2 (two) times daily as needed (for respiratory flares).     cyanocobalamin  (VITAMIN B12) 1000 MCG tablet Take 1 tablet (1,000 mcg total) by mouth daily. 90 tablet 3   diclofenac Sodium (VOLTAREN) 1 % GEL Apply 2 g topically 4 (four) times daily as needed (pain).     docusate sodium  (COLACE) 100 MG capsule Take 1 capsule (100 mg total) by mouth 2 (two) times daily as needed for mild constipation. 60 capsule 1   DULoxetine (CYMBALTA) 60 MG capsule Take 1 capsule by mouth daily.     gabapentin  (NEURONTIN ) 100 MG capsule Take 100 mg by mouth at bedtime. Bedtime dose = 400 mg     gabapentin  (NEURONTIN ) 300 MG capsule Take 300 mg by mouth 3 (three) times daily.     lidocaine  (LIDODERM ) 5 % Place 1 patch onto the skin daily. Remove & Discard patch within 12 hours or as directed by MD     lidocaine -prilocaine  (EMLA ) cream Apply 1 Application topically as needed. 30 g 0   melatonin 3 MG TABS tablet Take 3 mg by mouth at  bedtime as needed (sleep).     mirtazapine (REMERON) 7.5 MG tablet Take 7.5 mg by mouth at bedtime.     morphine  (AVINZA ) 30 MG 24 hr capsule Take 30 mg by mouth in the morning and at bedtime.     Multiple Vitamin (MULTIVITAMIN WITH MINERALS) TABS tablet Take 1 tablet by mouth daily.     Oxycodone  HCl 10 MG TABS Take 20 mg by mouth every 4 (four) hours as needed (moderate to severe pain).     pantoprazole  (PROTONIX ) 40 MG tablet Take 1 tablet (40 mg total) by mouth 2 (two) times daily. (Patient taking differently: Take 40 mg by mouth daily.) 180 tablet 0   polyethylene glycol (MIRALAX  / GLYCOLAX ) 17 g packet Take 17 g by mouth daily.     tiZANidine (ZANAFLEX) 2 MG tablet Take 2 mg by mouth every 6 (six) hours as needed for muscle spasms.     valsartan  (DIOVAN ) 160 MG tablet Take 160 mg by mouth daily.     valsartan -hydrochlorothiazide  (DIOVAN -HCT) 160-12.5 MG tablet Take 1 tablet by mouth daily.     No current facility-administered medications for this visit.    Musculoskeletal: Strength & Muscle Tone: {desc; muscle tone:32375} Gait & Station: {PE GAIT ED WJUO:77474} Patient leans: {Patient Leans:21022755}  Psychiatric Specialty Exam:  Psychiatric Specialty Exam: There were no vitals taken for this visit.There is no height or weight on file to calculate BMI. Review of Systems  General Appearance: {Appearance:22683}  Eye Contact:  {BHH EYE CONTACT:22684}  Speech:  {Speech:22685}  Volume:  {Volume (PAA):22686}  Mood:  {BHH MOOD:22306}  Affect:  {Affect (PAA):22687}  Thought Content: {Thought Content:22690}   Suicidal Thoughts:  {ST/HT (PAA):22692}  Homicidal Thoughts:  {  ST/HT (PAA):22692}  Thought Process:  {Thought Process (PAA):22688}  Orientation:  {BHH ORIENTATION (PAA):22689}    Memory: {BHH MEMORY:22881}  Judgment:  {Judgement (PAA):22694}  Insight:  {Insight (PAA):22695}  Concentration:  {Concentration:21399}  Recall:  not formally assessed ***  Fund of Knowledge: {BHH  GOOD/FAIR/POOR:22877}  Language: {BHH GOOD/FAIR/POOR:22877}  Psychomotor Activity:  {Psychomotor (PAA):22696}  Akathisia:  {BHH YES OR NO:22294}  AIMS (if indicated): {Desc; done/not:10129}  Assets:  {Assets (PAA):22698}  ADL's:  {BHH JIO'D:77709}  Cognition: {chl bhh cognition:304700322}  Sleep:  {BHH GOOD/FAIR/POOR:22877}    Screenings: [employer] Row Office Visit from 11/24/2023 in [facility] Dept Of [place] CONSULT from 03/14/2023 in [facility]  PHQ-2 Total Score 6 4  PHQ-9 Total Score 18 --   Flowsheet Row Office Visit from 11/24/2023 in [facility] Dept Of [place] IR FLUORO/SHUNT/FIST from 11/14/2023 in [facility] Clinical Support from 09/20/2023 in [facility] - A Dept Of [place]  C-SSRS RISK CATEGORY No Risk No Risk Low Risk     Collaboration of Care: {BH OP Collaboration of Care:21014065}  Patient/Guardian was advised Release of Information must be obtained prior to any record release in order to collaborate their care with an outside provider. Patient/Guardian was advised if they have not already done so to contact the registration department to sign all necessary forms in order for us  to release information regarding their care.   Consent: Patient/Guardian gives verbal consent for treatment and assignment of benefits for services provided during this visit. Patient/Guardian expressed understanding and agreed to proceed.   Lashaunta Sicard, MD 10/10/202510:02 AM

## 2024-01-27 ENCOUNTER — Ambulatory Visit: Admitting: Physical Therapy

## 2024-01-27 ENCOUNTER — Encounter: Payer: Self-pay | Admitting: Physical Therapy

## 2024-01-27 ENCOUNTER — Other Ambulatory Visit: Payer: Self-pay

## 2024-01-27 DIAGNOSIS — Z9181 History of falling: Secondary | ICD-10-CM | POA: Diagnosis present

## 2024-01-27 DIAGNOSIS — R2681 Unsteadiness on feet: Secondary | ICD-10-CM | POA: Diagnosis present

## 2024-01-27 DIAGNOSIS — M6281 Muscle weakness (generalized): Secondary | ICD-10-CM | POA: Diagnosis present

## 2024-01-27 DIAGNOSIS — Z8673 Personal history of transient ischemic attack (TIA), and cerebral infarction without residual deficits: Secondary | ICD-10-CM | POA: Insufficient documentation

## 2024-01-27 NOTE — Therapy (Signed)
 OUTPATIENT PHYSICAL THERAPY NEURO EVALUATION   Patient Name: Todd [name]. MRN: [id number] DOB:1956/02/26, 68 y.o., male Today's Date: 01/27/2024   PCP: [name] LABOR, MD REFERRING PROVIDER: Tina Pauletta BROCKS, MD  END OF SESSION:  PT End of Session - 01/27/24 1111     Visit Number 1    Number of Visits 9   8 + eval   Date for Recertification  03/30/24   pushed out due to potential scheduling delay   Authorization Type [employer] Dual Complete    PT Start Time 1102    PT Stop Time 1148    PT Time Calculation (min) 46 min    Equipment Utilized During Treatment Gait belt    Activity Tolerance Patient tolerated treatment well    Behavior During Therapy WFL for tasks assessed/performed          Past Medical History:  Diagnosis Date   Allergy    Anxiety    Arthritis    Asthma    Bladder cancer (HCC) dx'd 10/2013   recurrent   Chronic gastritis    Chronic low back pain    Colon cancer (HCC) dx'd 09/2011   COPD (chronic obstructive pulmonary disease) (HCC)    Depression    Diverticulosis    Dysuria    GERD (gastroesophageal reflux disease)    History of acute pancreatitis    due to SIRS   History of adenomatous polyp of colon    04-04-2015  last colonoscopy w/ multiple polyps--  tubular adenomas, beign polypoid colonic mucous   History of bladder cancer monitory by  dr Ceil   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   History of colon cancer oncologist-- dr cloretta--  dx  with Hereditary non-polyposis colon cancer syndrome,  MLH1 gene mutation--     dx june 2013--  stage II, T3,N0--  s/p  transverse colectomy--  completed 2 cycles  chemotherapy--  NO RECURRENCE in clinical remission   Hypertension    Lactose intolerance    Lower urinary tract symptoms (LUTS)    Lynch syndrome    MLH1 gene mutation    Short of breath on exertion    Wears glasses    Past Surgical History:  Procedure Laterality Date   CHOLECYSTECTOMY  2013   COLON SURGERY  06/ 2013    Transverse   COLONOSCOPY N/A 12/04/2013   Procedure: COLONOSCOPY;  Surgeon: Lamar JONETTA Aho, MD;  Location: [facility];  Service: Endoscopy;  Laterality: N/A;   COLONOSCOPY WITH ESOPHAGOGASTRODUODENOSCOPY (EGD)  last one 04-04-2015   polypecotmy's gastric and colon    CYSTOSCOPY W/ RETROGRADES Bilateral 06/06/2015   Procedure: BILATERAL  RETROGRADE PYELOGRAM;  Surgeon: Mark Ottelin, MD;  Location: [facility];  Service: Urology;  Laterality: Bilateral;   ERCP N/A 01/01/2014   Procedure: ENDOSCOPIC RETROGRADE CHOLANGIOPANCREATOGRAPHY (ERCP);  Surgeon: Lamar JONETTA Aho, MD;  Location: [facility];  Service: Endoscopy;  Laterality: N/A;  with spyglass - preprocedure ATB's   IR IMAGING GUIDED PORT INSERTION  11/14/2023   LUMBAR DISC SURGERY  x4   last one 2001   fusion L5 -- S1   SPYGLASS CHOLANGIOSCOPY N/A 01/01/2014   Procedure: SPYGLASS CHOLANGIOSCOPY;  Surgeon: Lamar JONETTA Aho, MD;  Location: [facility];  Service: Endoscopy;  Laterality: N/A;   TONSILLECTOMY  age 66   TRANSURETHRAL RESECTION OF BLADDER TUMOR WITH GYRUS (TURBT-GYRUS) N/A 10/26/2013   Procedure: TRANSURETHRAL RESECTION OF BLADDER TUMOR WITH GYRUS (TURBT-GYRUS) ;  Surgeon: Mark C Ottelin, MD;  Location:  [facility];  Service: Urology;  Laterality: N/A;   TRANSURETHRAL RESECTION OF BLADDER TUMOR WITH GYRUS (TURBT-GYRUS) N/A 05/27/2014   Procedure: TRANSURETHRAL RESECTION OF BLADDER TUMOR  AND TRANSURETHERAL BIOPSY OF PROSTATE;  Surgeon: Mark C Ottelin, MD;  Location: [facility];  Service: Urology;  Laterality: N/A;   TRANSURETHRAL RESECTION OF BLADDER TUMOR WITH GYRUS (TURBT-GYRUS) N/A 06/06/2015   Procedure: TRANSURETHRAL RESECTION OF BLADDER TUMOR WITH GYRUS (TURBT-GYRUS);  Surgeon: Mark Ottelin, MD;  Location: [facility];  Service: Urology;  Laterality: N/A;   Patient Active Problem List   Diagnosis Date Noted   History of stroke 01/19/2024   Watery diarrhea 11/24/2023   Rash 11/10/2023    S/P ileal conduit (HCC) 11/10/2023   CKD (chronic kidney disease), stage III (HCC) 09/20/2023   Low serum vitamin B12 05/12/2023   Impairment of cognitive function 05/12/2023   Abnormal neurological exam 05/12/2023   Constipation 03/03/2023   Hx of colonic polyps 03/15/2016   Nausea without vomiting 03/15/2016   Abdominal pain, epigastric 03/15/2016   Lynch syndrome 03/26/2015   Hx of colon cancer, stage I 03/26/2015   LUQ abdominal pain 03/26/2015   Benign neoplasm of colon 12/04/2013   Diverticulosis of colon 12/04/2013   Hip pain, bilateral 11/28/2013   Periumbilical abdominal pain 11/28/2013   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   History of colon cancer 08/28/2013   COPD (chronic obstructive pulmonary disease) (HCC) 08/28/2013   GERD (gastroesophageal reflux disease) 08/28/2013   Depression 08/28/2013   Chronic pain syndrome 08/28/2013   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   Testicular pain, right 08/28/2013    ONSET DATE: 01/19/2024 (referral date)  REFERRING DIAG: Z86.73 (ICD-10-CM) - History of stroke  THERAPY DIAG:  History of falling  Unsteadiness on feet  Muscle weakness (generalized)  Rationale for Evaluation and Treatment: Rehabilitation  SUBJECTIVE:                                                                                                                                                                                             SUBJECTIVE STATEMENT: Pt on phone with alternate provider office at onset of session due to ongoing concerns regarding urine output through urostomy.  He ambulates in w/ rollator - able to manage w/ single UE support w/o overt LOB.  He endorses his CVA was in January and he has struggled with some memory changes and chronic pain.  He has a long family history of cancer and other medical concerns and has lost a sibling to colon cancer.  He reports not having a normal daily routine because his  fatigue fluctuates.  He wishes to do more activity.  He also has concerns about seal of his urostomy and feeling he smells of urine 24/7.  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 2025), urostomy  PAIN:  Are you having pain? Yes: NPRS scale: 5 Pain location: RLE Pain description: variable Aggravating factors: initiating movement Relieving factors: oxycodone , morphine , aleve  PRECAUTIONS: Fall and Other: urostomy bag  RED FLAGS: None   WEIGHT BEARING RESTRICTIONS: No  FALLS: Has patient fallen in last 6 months? Yes. Number of falls several - most of the time he can catch himself to slow the fall with furniture - usually falls when not using rollator which he sometimes does not do in home.  LIVING ENVIRONMENT: Lives with: lives alone Lives in: House/apartment - rooming house (bedroom, kitchen, bath) Stairs: No Has following equipment at home: Vannie - 4 wheeled  PLOF: Requires assistive device for independence  PATIENT GOALS: to maintain independent living for as long as possible  OBJECTIVE:  Note: Objective measures were completed at Evaluation unless otherwise noted.  DIAGNOSTIC FINDINGS:  CT Head 09/16/2023: IMPRESSION: 1. No acute intracranial abnormality. 2. No acute osseous injury of the cervical spine. 3. Mild-to-moderate paraseptal emphysematous changes in the visualized bilateral upper lobes.   Emphysema (ICD10-J43.9).  MRI Brain 05/19/2023: IMPRESSION: 1.  No evidence of an acute intracranial abnormality. 2. Moderate for age multifocal T2 FLAIR hyperintense signal abnormality within the cerebral white matter, nonspecific but most often secondary to chronic small vessel ischemia. 3. 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 4. Otherwise  unremarkable MRI appearance of the brain.  COGNITION: Overall cognitive status: History of cognitive impairments - at baseline - endorses memory deficits (especially short term)   SENSATION: Light touch: WFL  COORDINATION: WFL  EDEMA:  None significant in BLE  MUSCLE TONE: None noted in BLE  POSTURE: forward head  LOWER EXTREMITY ROM:     Active  Right Eval Left Eval  Hip flexion WFL  Hip extension   Hip abduction   Hip adduction   Hip internal rotation   Hip external rotation   Knee flexion   Knee extension   Ankle dorsiflexion   Ankle plantarflexion    Ankle inversion    Ankle eversion     (Blank rows = not tested)  LOWER EXTREMITY MMT:    MMT Right Eval Left Eval  Hip flexion Pt able to stand unassisted and ambulate w/ rollator; grossly >/=3+/5.  Hip extension   Hip abduction   Hip adduction   Hip internal rotation   Hip external rotation   Knee flexion   Knee extension   Ankle dorsiflexion   Ankle plantarflexion    Ankle inversion    Ankle eversion    (Blank rows = not tested)  BED MOBILITY:  Findings: Sit to supine increased time and uses rollator to stabilize himself Supine to sit increased time and uses rollator to stabilize himself Rolling to Right Modified independence Rolling to Left Modified independence  TRANSFERS: Sit to stand: Modified independence  Assistive device utilized: Environmental consultant - 4 wheeled     Stand to sit: Modified independence  Assistive device utilized: Environmental consultant - 4 wheeled     Chair to chair: SBA  Assistive device utilized: Environmental consultant - 4 wheeled      Floor: Modified independence  Assistive device  utilized: Environmental consultant - 4 wheeled    - pt able to kneel w/ chair seat and rollator seat to clean up spilled water  and gets up without assistance.  RAMP:  Not tested  CURB:  Not tested  STAIRS: Not tested GAIT: Findings: Gait Characteristics: step through pattern and decreased stride length, Distance walked: various clinic distances,  Assistive device utilized:Walker - 4 wheeled, Level of assistance: SBA, and Comments: Pt able to ambulate w/o overt LOB intermittently using single UE support w/o pathway deviation - at onset of session able to speak to therapist and on the phone while ambulating  FUNCTIONAL TESTS:  5 times sit to stand: 14.03 sec hands on knees, mild DOE Timed up and go (TUG): TBA 6 minute walk test: TBA  PATIENT SURVEYS:  ABC scale: The Activities-Specific Balance Confidence (ABC) Scale 0% 10 20 30  40 50 60 70 80 90 100% No confidence<->completely confident  "How confident are you that you will not lose your balance or become unsteady when you . . .   Date tested 01/27/2024  Walk around the house 40%  2. Walk up or down stairs 40%  3. Bend over and pick up a slipper from in front of a closet floor 30%  4. Reach for a small can off a shelf at eye level 80%  5. Stand on tip toes and reach for something above your head 50%  6. Stand on a chair and reach for something 30%  7. Sweep the floor 80%  8. Walk outside the house to a car parked in the driveway 60%  9. Get into or out of a car 40%  10. Walk across a parking lot to the mall 60%  11. Walk up or down a ramp 60%  12. Walk in a crowded mall where people rapidly walk past you 80%  13. Are bumped into by people as you walk through the mall 60%  14. Step onto or off of an escalator while you are holding onto the railing 50%  15. Step onto or off an escalator while holding onto parcels such that you cannot hold onto the railing 40%  16. Walk outside on icy sidewalks 30%  Total: #/16 830/16 = 51.9%                                                                                                                                 TREATMENT DATE: 01/27/2024    PATIENT EDUCATION: Education details: Brain anatomy related to CVA and current treatment regimen.  Discussed social support and possible talk therapy/counseling referral - pt politely declines for  now as well as ST referral.  Recommending full-time rollator use to limit falls.  Outcome interpretations, assessments used and to be used, and goals to be set. Person educated: Patient Education method: Explanation Education comprehension: verbalized understanding and needs further education  HOME EXERCISE PROGRAM: To be established.  GOALS: Goals reviewed with patient? Yes  SHORT TERM GOALS: Target date: 02/24/2024  Pt will be independent and compliant with introductory HEP in order to maintain functional progress and improve mobility. Baseline:  To be established. Goal status: INITIAL  2.  Pt will decrease 5xSTS to </=13 seconds w/o UE support in order to demonstrate decreased risk for falls and improved functional bilateral LE strength and power. Baseline: 14.03 sec hands on knees, mild DOE Goal status: INITIAL  3.  TUG to be assessed w/ STG set as appropriate. Baseline: To be assessed. Goal status: INITIAL  4.  to be assessed w/ STG set as appropriate. Baseline: To be assessed. Goal status: INITIAL  LONG TERM GOALS: Target date: 03/23/2024  Pt will be independent and compliant with advanced and finalized HEP in order to maintain functional progress and improve mobility. Baseline: To be established. Goal status: INITIAL  2.  TUG to be assessed w/ LTG set as appropriate. Baseline: To be assessed. Goal status: INITIAL  3.  to be assessed w/ LTG set as appropriate. Baseline: To be assessed. Goal status: INITIAL  4.  Patient will improve ABC Scale to >/=61.9% in order to demonstrate improved quality of life and decreased fall risk. Baseline: 51.9%  Goal status: INITIAL  ASSESSMENT:  CLINICAL IMPRESSION: Patient is a 68 y.o. male who was seen today for physical therapy evaluation and treatment for repeated falls and chronic CVA.  Pt has a significant PMH of Hx of colon cancer, COPD, GERD, depression, chronic pain syndrome, MLH1 gene mutation, bladder  carcinoma metastatic to intrapelvic lymph nodes, cognitive impairment, CKD stage III, CVA (Jan. 2025), and urostomy.  Identified impairments include mild generalized weakness, chronic pain, mild postural deficits and memory decline since CVA.  Evaluation via the following assessment tools: 5xSTS coupled with history of falls indicates elevated fall risk.  He would benefit from skilled PT to address impairments as noted and progress towards long term goals.  OBJECTIVE IMPAIRMENTS: decreased activity tolerance, decreased balance, decreased endurance, decreased strength, and pain.   ACTIVITY LIMITATIONS: carrying, lifting, squatting, transfers, and locomotion level  PARTICIPATION LIMITATIONS: interpersonal relationship, driving, shopping, and community activity  PERSONAL FACTORS: Fitness, Past/current experiences, Time since onset of injury/illness/exacerbation, Transportation, and 1-2 comorbidities: ongoing chemo, CVA in Jan 2025 are also affecting patient's functional outcome.   REHAB POTENTIAL: Excellent  CLINICAL DECISION MAKING: Evolving/moderate complexity  EVALUATION COMPLEXITY: Moderate  PLAN:  PT FREQUENCY: 1x/week  PT DURATION: 8 weeks  PLANNED INTERVENTIONS: 97164- PT Re-evaluation, 97750- Physical Performance Testing, 97110-Therapeutic exercises, 97530- Therapeutic activity, 97112- Neuromuscular re-education, 97535- Self Care, [id number]- Manual therapy, [phone]- Gait training, [phone]- Electrical stimulation (manual), Patient/Family education, Balance training, Stair training, Taping, Joint mobilization, Vestibular training, DME instructions, Cryotherapy, and Moist heat  PLAN FOR NEXT SESSION: Resources for contacting Child psychotherapist.  ASSESS TUG/6MWT - set goals as appropriate.  Gait training w/ rollator.  High level strength and balance.  Did he follow-up w/ urology about bag seal concerns?  Establish HEP for strength and balance.  Endurance/coordination as needed.   Daved KATHEE Bull,  PT, DPT 01/27/2024, 5:04 PM

## 2024-01-29 ENCOUNTER — Other Ambulatory Visit: Payer: Self-pay

## 2024-01-30 ENCOUNTER — Ambulatory Visit (HOSPITAL_COMMUNITY)

## 2024-02-01 ENCOUNTER — Ambulatory Visit (HOSPITAL_COMMUNITY)

## 2024-02-02 ENCOUNTER — Other Ambulatory Visit

## 2024-02-02 ENCOUNTER — Ambulatory Visit

## 2024-02-02 ENCOUNTER — Inpatient Hospital Stay

## 2024-02-06 NOTE — Progress Notes (Signed)
 WOCN Consult Services OSTOMY CLINIC VISIT NOTE   Reason for Consult:  - Ostomy Care - Ostomy Teaching - Pouching Issue - add on appointment   Problem List:  Active Problems:   * No active hospital problems. *  Assessment: Clinic called WOCN team for add on appointment. Todd [name] states the urostomy pouch he is using begins to detach from the midline. He was using a convex pouch. A peristomal hernia was evident as he was sitting in a chair. WOC Nurse placed him in a one piece flat system. He will go home and test this system and call the clinic if he would like the system changed.   Stoma Type: -  Urostomy Stoma Location: - RLQ (Right Lower Quadrant)   Stoma Characteristics: - Round - Budded - Os location center - Diameter 22 mm Stoma Mucosal Condition and Color: - Moist - Red   Mucocutaneous Junction: - Intact  Rod/Stents: - No   Output: - Yellow - Clear - Urine   Peristomal Skin Condition:  - Intact  Abdominal Contours: - Rounded - Soft - Peristomal Hernia  Pouching System: - 1 Piece - Flat - CTF (Cut to fit) - Moldable barrier ring - Ostomy support belt Anticipated Wear Time of Pouching System: - To be determined   Teaching/Instructions: - Measuring stoma size and cutting skin barrier appropriately. - Application of moldable barrier ring. - Reviewed when to contact the outpatient WOC nurse with questions/concerns.  Recommendations/Plan:  - Make follow up appointment to see WOC Nurse in 2 weeks - If Hollister one piece flat pouch works patient will call clinic and ask to have system changed - If Hollister one piece flat pouch does not work. Plan to test Coloplast Click flip.   Plan of Care Discussed With: - Patient - LIP Abigail Ronal Speed, ANP  Ostomy Supplies:  - Ostomy pouching samples provided  Ostomy Product List: Outpatient Supply List  30 day supply   Pouching System  Hollister [phone]) Premier 1-Piece Pouching System (Extended Wear)  drainable; 20 per month  Accessories Coloplast [phone]) elastic strip, 40 per month M9 (7717) Odor Eliminator Drops Unscented, 8 oz. Per month Convatec [phone]) adhesive remover wipe, 50 per month Hollister [phone]) stoma powder, 10 ounces every 6 months 62M (3342) skin barrier wipes, 150 per 6 months  Urine drainage bag (overnight bag)  order 1   Urine Leg bag 500 mL order 1  Coloplast (12005) Ostomy Support belt Large, one per month    WOCN Follow Up: - Follow up with out patient Ostomy Nurse visit in 2 weeks  Workup Time: 60 minutes  Inocente JUDITHANN Parrot, BSN, RN, CWCN Reconsult for questions,concerns or deterioration Available by [employer]  and Vocera *[address] or *33 Wound Ostomy Nurse

## 2024-02-06 NOTE — Progress Notes (Signed)
 ASSESSMENT and Plan Todd [name]. is a 68 y.o. male with  h/o Recurrent Stage 1 high-grade urothelial carcinoma, more recently with POD to MIBC, sp RC/IC, and getting adjuvant treatment locally  First diagnosed in 2015 with LgTa on TURBT with mitomycin . He then had multiple other TURBTs from 2016 to 2018 (as detailed in full oncology history in HPI) with intermittent adherence/tolerability to BCG. Most recently, has now been diagnosed with T1 bladder cancer with CIS and got sporadic Pembro. Missed multiple doses and appointment. Represented with concern for worsening disease and is s/p  TURBT on 01/2023. Ct showed Increasing right posterior lateral bladder wall thickening, concerning for disease progression. New right ureteral obstruction associated hydroureteronephrosis. Ultimately, He had a RC/IC on 02/07/2023 with Todd [name]; path HG UC with CIS at left ureteral margin, pT3apN1. He has been getting treated closer to home at [facility], but he isnt able to give us  much info at the time of his visit on 07/07/23 other than he has had radiation to his belly  review of outside records on 07/07/33 -03/03/23: Appt with Todd Todd [name] (med onc) who rec adjuvant chemotherapy +/- pelvic radiation -03/14/23: Appt with Todd Todd [name] to discuss role of radiation -05/09/23: From Todd Todd [name]: Had CT sim 12/22, but admitted to [facility] w/ sepsis requiring long hospital stya and delaying treatment.  -05/12/2023: follow up with Todd Todd [name], who no longer recommended chemo due to decreased PS and worsening renal function -Rad Onc plan: 45 Gy x 25 fractions to surgical bed + pelvic nodes, with 5.4 Gy boost  It looks like he may have started radiation end of Jan/beg of Feb 2025  Started adjuvant Nivo on 10/13/2023 locally after multiple hospitalization which resulted in decreased PS Will be having CT imaging done 02/10/2024. 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 Major concern is for housing. Todd Leech, RN, has worked extensively to try to find a solution. Social work team provided list of options. Emotional support provided  4. Balanitis  Cleanse penis daily I will send in antifungal cream to use BID x 10 days  5. Urostomy leaking: Seen by our WOCN team today, appreciate their care  Fitted for a new bag, samples given Has their contact information  I will have him come back to see me in a couple of weeks to check the balanitis, doesn't need labs, WOCN will see same day  I personally spent 90 minutes face-to-face and non-face-to-face in the care of this patient, which includes all pre, intra, and post visit time on the date of service.  All documented time was specific to the E/M visit and does not include any procedures that may have been performed.  Todd [name] Bring, NP-C Adult Nurse Practitioner Urology & GU Medical Oncology   RFV: follow up bladder cancer  HISTORY OF PRESENT ILLNESS:   See oncology history overview below for full details regarding previous diagnostic workup and treatment to date. Most recently, he developed recurrent hematuria leading to CT urogram in March 2022 which showed new right lateral masslike thickening of the bladder wall. He then  underwent repeat TURBT 08/13/20 with path showing invasive high grade urothelial carcinoma, T1 with CIS. He then had repeat TURBT 09/19/20 for further resection which confirmed T1 disease.   Systemic tx with Pembrolizumab C1: 11/20/20 C2: 12/11/2020 C3: 01/01/2021 C4: 02/12/2021 (deferred due to  illness) C5:03/12/2021 C6:04/23/2021 C7:05/14/2021 C8: 09/17/2021   Scans: -12/11/2020 -02/19/2021 -07/15/2021 -03/2022  Unfortunately lost to follow up and presented with disease progression on 12/2022 CT (bladder mass and hydro)  S/p radical cystoprostatectomy with ileal conduit with Todd [name] on  02/07/2023; path HG UC with CIS at left ureteral margin, pT3apN1. Left ureteral margin + for CIS. 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  -05/12/2023: follow up with Todd Todd [name], who no longer recommended chemo due to decreased PS and worsening renal function -Rad Onc plan: 45 Gy x 25 fractions to surgical bed + pelvic nodes, with 5.4 Gy boost  It looks like he may have started radiation end of Jan/beg of Feb 2025  Started adjuvant Nivo on 10/13/2023 locally after multiple hospitalization which resulted in decreased PS Will be having CT imaging done 02/10/2024. Next visit with Todd Todd [name] 02/16/2024 OSH labs 01/19/24 with  SCr 1.68  Interval hx 02/06/2024  -Started adjuvant Nivo on 10/13/2023 locally after multiple hospitalization which resulted in decreased PS -Will be having CT imaging done 02/10/2024. Next visit with Todd Todd [name] 02/16/2024 -OSH labs 01/19/24 with  SCr 1.68   His concerns today: -wondering if a surgeon can fix his scar because it is making it difficult for him to wear his bag -sometimes can't even get one full day out of his bag -wearing hernia belt, leaves it connected to a drainage  bag -renting a room from someone, who is complaining about the odor of his urine (he has moved out of the nursing home); -Says he is going to be evicted on Friday. He has talked  to Legal [employer] and trying to sort this out but he's not sure what's going to happen -has anxiety and depression; Todd Todd [name] is his PCP and she is helping manage this -sexual dysfunction: pills and shots dont work. Said he heard we can put a pump in -gets a ride through insurance -the head of his penis bleeds at times. Has gotten bad in the past few months. It is uncomfortable, like it is rotting. Says its not from his urethra. Has not been looked at -has sometimes has a hard time controlling his bowels, this impacts his ability to be out with his friends -says he would not have had surgery if he had to do it all over again -Denies SI- says he wants to find ways to live better but is struggling to figure that out       Hematology/Oncology History Overview Note   - 09/2011: Surgical resection of transverse colon, T3N0M0. Only 6 LN removed. 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 Completed 2 cycles of adjuvant 5-FU leucovorin, stopped future cycles due to side effects. - 10/26/13: TURBT for 10mm left bladder wall mass resected with mitomycin , pathology revealed LgTa.  - 05/27/14: Repeat TURBT, path again with LgTa. Only completed 2/6 BCG treatments, then lost to follow-up. -  06/06/15: Repeat TURBT with 3 papillary tumors, pathology with LgTa and CIS. - 06/2015: CT A/P with diffuse bladder wall thickening, no definitive evidence of metastatic disease - 07/21/15: Repeat TURBT with path showing high grade dysplasia. Again scheduled for BCG but only completed 1-2 doses. - 10/19/16: Repeat TURBT with low grade papillary urothelial carcinoma/CIS. Received 4/6 doses of BCG. Lost to follow-up. - 03/09/19: CT CAP with LUL 2cm GGO, minimal right urinary bladder wall thickening of 4mm - 01/22/20: CT A/P in the ED for N/V/D/weight loss. Bladder thickening more pronounced at 7mm. - 25-Jun-2020: CT urogram for hematuria showed new right lateral masslike  thickening of bladder wall, measure 8mm. No evidence metastatic disease. - 08/13/20: Repeat TURBT with invasive high-grade urothelial carcinoma with nonfocal invasion into lamina propria (CIS present), second specimen with invasive high-grade urothelial carcinoma extensively involving lamina propria, with one fragment showing involvement of muscle bundles which are equivocal for muscularis mucosae or muscularis propria (CIS again present). - 09/19/20: Repeat TURBT for re-resection with invasive high-grade urothelial carcinoma with non-focal lamina propria invasion and CIS, no involvement of muscularis propria. - 11/20/20: Presented for C1 pembrolizumab 200mg . Missed restaging CT scan prior to treatment.    Bladder cancer (CMS-HCC)   08/29/2015 Initial Diagnosis     Bladder cancer (CMS-HCC)     10/30/2020 -  Cancer Staged     Staging form: Urinary Bladder, AJCC 7th Edition - Clinical: Stage I (T1, N0, M0) - Signed by Neysa FORBES Kras, MD on 10/30/2020       11/20/2020 -  Chemotherapy     OP BLADDER PEMBROLIZUMAB pembrolizumab 200 mg IV on day 1, every 21 days      PAST MEDICAL HISTORY:  Past Medical History:  Diagnosis Date  . Back pain   . Cancer    (CMS-HCC)    bladder,  colon  . Lynch syndrome 10/29/2020   PAST SURGICAL HISTORY:  Past Surgical History:  Procedure Laterality Date  . BACK SURGERY    . BLADDER SURGERY    . CHG X-RAY RETROGRADE PYELOGRAM Right 01/11/2023   Procedure: CHG X-RAY RETROGRADE PYELOGRAM;  Surgeon: Estelle Camellia Anes, MD;  Location: [facility] PROCEDURE SUITES [facility];  Service: Urology  . CHOLECYSTECTOMY    . COLON SURGERY    . PR COLONOSCOPY FLX DX W/COLLJ SPEC WHEN PFRMD N/A 01/20/2023   Procedure: COLONOSCOPY, FLEXIBLE, PROXIMAL TO SPLENIC FLEXURE; DIAGNOSTIC, W/WO COLLECTION SPECIMEN BY BRUSH OR WASH;  Surgeon: Erick Prentice HERO, MD;  Location: GI PROCEDURES MEMORIAL [facility];  Service: Gastroenterology  . PR CYSTOSCOPY,INSERT URETERAL STENT Right 01/11/2023   Procedure:  CYSTOURETHROSCOPY,  WITH INSERTION OF INDWELLING URETERAL STENT (EG, GIBBONS OR DOUBLE-J TYPE);  Surgeon: Estelle Camellia Anes, MD;  Location: [facility] PROCEDURE SUITES [facility];  Service: Urology  . PR CYSTOURETHROSCOPY,FULGUR 0.5-2 CM LESN N/A 07/21/2015   Procedure: CYSTOURETHROSCOPY, W/FULGURATION (INCLUDE CRYOSURGERY/LASER) &/OR RESECTION; SM BLADDER TUMOR(0.5CM TO 2CM);  Surgeon: Ozell Dallas Ill, MD;  Location: [facility]O PROCEDURE SUITES [facility];  Service: Urology  . PR CYSTOURETHROSCOPY,FULGUR 0.5-2 CM LESN N/A 10/19/2016   Procedure: CYSTOURETHROSCOPY, W/FULGURATION (INCLUDE CRYOSURGERY/LASER) &/OR RESECTION; SM BLADDER TUMOR(0.5CM TO 2CM);  Surgeon: Ozell Dallas Ill, MD;  Location: CYSTO PROCEDURE SUITES [facility];  Service: Urology  . PR CYSTOURETHROSCOPY,FULGUR 2-5 CM LESN N/A 08/13/2020   Procedure: CYSTOURETHROSCOPY, W/FULGURATION (INCLD CRYOSURGERY/LASER) &/OR RESECTION; MEDIUM BLADDER TUMOR(S) (2-5 CM);  Surgeon: Clorinda Ole Chalk, MD;  Location: CYSTO PROCEDURE SUITES [facility];  Service: Urology  . PR CYSTOURETHROSCOPY,FULGUR >5 CM LESN N/A 09/19/2020   Procedure: CYSTOURETHROSCOPY, W/FULGURATION (  INCLUDE CRYOSURGERY OR LASER) &/OR RESECTION; LARGE BLADDER TUMOR(S);  Surgeon: Oliva Prentice Oiler, MD;  Location: [facility]O PROCEDURE SUITES [facility];  Service: Urology  . PR CYSTOURETHROSCOPY,URETER CATHETER Right 01/11/2023   Procedure: CYSTOURETHROSCOPY, W/URETERAL CATHETERIZATION, W/WO IRRIG, INSTILL, OR URETEROPYELOG, EXCLUS OF RADIOLG SVC;  Surgeon: Estelle Camellia Anes, MD;  Location: CYSTO PROCEDURE SUITES [facility];  Service: Urology  . PR REMV BLADDER/NODES,ILEAL CONDUIT Midline 02/07/2023   Procedure: CYSTECTOMY, COMPLETE, W/URETEROILEAL CONDUIT/SIGMOID BLADDER & INTEST ANAST; W/BIL PELVIC LYMPHADEN & NODES;  Surgeon: Oiler Oliva Prentice, MD;  Location: OR [facility];  Service: Urology  . PR REMV PROSTATE,RETROPUB,RADICAL Midline 02/07/2023   Procedure: PROSTATECTOMY RETROPUBIC RAD W/WO NERV SPARING;  Surgeon: Oiler Oliva Prentice, MD;  Location: OR [facility];  Service: Urology  . PR UPPER GI ENDOSCOPY,DIAGNOSIS N/A 01/20/2023   Procedure: UGI ENDO, INCLUDE ESOPHAGUS, STOMACH, & DUODENUM &/OR JEJUNUM; DX W/WO COLLECTION SPECIMN, BY BRUSH OR WASH;  Surgeon: Erick Prentice HERO, MD;  Location: GI PROCEDURES [facility];  Service: Gastroenterology   MEDICATIONS:  Current Outpatient Medications  Medication Sig Dispense Refill  . acetaminophen  (TYLENOL ) 500 MG tablet Take 2 tablets (1,000 mg total) by mouth Three (3) times a day. (Patient not taking: Reported on 10/24/2023)    . albuterol HFA 90 mcg/actuation inhaler Inhale 1 puff by mouth every eight (8) hours as needed for wheezing or shortness of breath. 8.5 g 0  . aspirin 81 MG chewable tablet Chew 1 tablet (81 mg total) daily. (Patient not taking: Reported on 11/21/2023)    . atorvastatin (LIPITOR) 80 MG tablet Take 1 tablet (80 mg total) by mouth daily. (Patient not taking: Reported on 11/21/2023)    . budesonide-formoterol (SYMBICORT) 160-4.5 mcg/actuation inhaler INHALE 1 PUFF IN THE MORNING AND IN THE EVENING 6 g 1  . busPIRone (BUSPAR) 10 MG tablet Take 1 tablet (10 mg total) by mouth Three (3) times a day. 270 tablet 1  . cyanocobalamin , vitamin B-12, 1000 MCG tablet Take 1 tablet (1,000 mcg total) by mouth daily. (Patient not taking: Reported on 11/21/2023)    . diclofenac sodium (VOLTAREN) 1 % gel Apply 2 g topically four (4) times a day. (Patient not taking: Reported on 11/21/2023) 100 g 0  . DULoxetine (CYMBALTA) 60 MG capsule Take 1 capsule (60 mg total) by mouth daily. 90 capsule 1  . gabapentin  (NEURONTIN ) 300 MG capsule TAKE 1 CAPSULE BY MOUTH THREE TIMES A DAY. 90 capsule 3  . hydrOXYzine (ATARAX) 25 MG tablet Take 1 tablet (25 mg total) by mouth every eight (8) hours as needed. 90 tablet 1  . lidocaine  (ASPERCREME) 4 % patch Place 1 patch on the skin daily. (Patient not taking: Reported on 11/21/2023)    . mirtazapine (REMERON) 15 MG tablet Take 1 tablet (15  mg total) by mouth nightly. 90 tablet 1  . miscellaneous medical supply Misc Hollister 2-Piece CTF soft convexity [id number] order 20 per month  Hollister 2 piece Urostomy Pouch Red (mfg [id number]) order 20 per month   [employer] Elastic strip [id number], 40 per month  Coloplast [phone]) barrier ring, 20 per month  Convatec (576608) adhesive remover wipe, 50 per month  Hollister (7300) ostomy belt small, 1 per month  Hollister [phone]) stoma powder, 10 ounces every 6 months  27M (3342) skin barrier wipes, 150 per 6 months  Urine drainage bag (overnight bag) order 1 Urine Leg bag 500 mL order 1  Hollister Urostomy Drain Adapter [id number] order 2 per month 1 each 0  . miscellaneous medical  supply Misc Half moon tape for urostomy. 20 each 11  . morphine  (MS CONTIN ) 15 MG 12 hr tablet Take 1 tablet (15 mg total) by mouth two (2) times a day. 60 tablet 0  . multivitamins, therapeutic with minerals 9 mg iron-400 mcg tablet Take 1 tablet by mouth daily. (Patient not taking: Reported on 11/21/2023)    . naloxone  (NARCAN ) 4 mg nasal spray 1 spray (4 mg total) into the left nostril once as needed for up to 1 dose. One spray in either nostril once for known/suspected opioid overdose. May repeat every 2-3 minutes in alternating nostril til EMS arrives 2 each 0  . oxyCODONE  (ROXICODONE ) 10 mg immediate release tablet Take 1 tablet (10 mg total) by mouth every six (6) hours as needed for pain. 120 tablet 0  . pantoprazole  (PROTONIX ) 40 MG tablet Take 1 tablet (40 mg total) by mouth Two (2) times a day (30 minutes before a meal). 180 tablet 1  . tizanidine (ZANAFLEX) 2 MG tablet TAKE 1 TABLET (2 MG TOTAL) BY MOUTH TWO (2) TIMES A DAY AS NEEDED. 180 tablet 1  . valsartan  (DIOVAN ) 160 MG tablet Take 1 tablet (160 mg total) by mouth daily.    . valsartan  (DIOVAN ) 40 MG tablet Take 1 tablet (40 mg total) by mouth daily. 90 tablet 1   No current facility-administered medications for this visit.   Facility-Administered  Medications Ordered in Other Visits  Medication Dose Route Frequency Provider Last Rate Last Admin  . niCARdipine 40 mg in sodium chloride  0.9% 200 mL (0.2 mg/mL) infusion PMB  5 mg/hr Intravenous Continuous Junita Zachary Sages, DO       ALLERGIES:  Allergies  Allergen Reactions  . Clindamycin  Hives  . Lincomycin Hcl Hives, Itching and Rash    Skin peeled off hands  . Milk Containing Products (Dairy) Nausea And Vomiting and Other (See Comments)    Lactose intolerant  . Hydrocodone Itching    feet and hands    FAMILY HISTORY:  Family History  Problem Relation Age of Onset  . Depression Mother   . Depression Sister   . Drug abuse Neg Hx   . Mental illness Neg Hx   . Alcohol abuse Neg Hx    SOCIAL HISTORY:  Social History   Socioeconomic History  . Marital status: Divorced  . Number of children: 7  Occupational History  . Occupation: Retired  Tobacco Use  . Smoking status: Every Day    Current packs/day: 0.50    Types: Cigarettes    Passive exposure: Current  . Smokeless tobacco: Never  . Tobacco comments:    10cpd  Vaping Use  . Vaping status: Never Used  Substance and Sexual Activity  . Alcohol use: Not Currently  . Drug use: Not Currently  . Sexual activity: Yes    Partners: Female   Social Drivers of Health   Financial Resource Strain: Medium Risk (06/25/2023)   Overall Financial Resource Strain (CARDIA)   . Difficulty of Paying Living Expenses: Somewhat hard  Food Insecurity: No Food Insecurity (11/21/2023)   Hunger Vital Sign   . Worried About Programme Researcher, Broadcasting/film/video in the Last Year: Never true   . Ran Out of Food in the Last Year: Never true  Recent Concern: Food Insecurity - Food Insecurity Present (10/21/2023)   Received from [facility]   Hunger Vital Sign   . Within the past 12 months, you worried that your food would run out before you got the  money to buy more.: Sometimes true   . Within the past 12 months, the food you bought just didn't last and  you didn't have money to get more.: Sometimes true  Transportation Needs: No Transportation Needs (11/21/2023)   PRAPARE - Transportation   . Lack of Transportation (Medical): No   . Lack of Transportation (Non-Medical): No  Physical Activity: Inactive (12/28/2021)   Exercise Vital Sign   . Days of Exercise per Week: 0 days   . Minutes of Exercise per Session: 0 min  Stress: Stress Concern Present (06/25/2023)   [employer] of Occupational Health - Occupational Stress Questionnaire   . Feeling of Stress : Very much  Social Connections: Moderately Isolated (12/28/2021)   Social Connection and Isolation Panel   . Frequency of Communication with Friends and Family: More than three times a week   . Frequency of Social Gatherings with Friends and Family: More than three times a week   . Attends Religious Services: Never   . Active Member of Clubs or Organizations: Yes   . Attends Banker Meetings: Never   . Marital Status: Divorced  Housing: High Risk (11/21/2023)   Housing   . Within the past 12 months, have you ever stayed: outside, in a car, in a tent, in an overnight shelter, or temporarily in someone else's home (i.e. couch-surfing)?: Yes   . Are you worried about losing your housing?: Yes   PHYSICAL EXAM:  GENERAL: Pleasant male in no acute distress.  VITAL SIGNS: There were no vitals taken for this visit. Head: normocephalic Eyes: sclera aniecteric Pulm: Non labored WOB Msk: No Le edema NEURO: A&O x 3 GU: pink stoma, clear yellow urine. 2 flat erythematous patches on the glans. No active bleeding or oozing. Meatus is patent with no discharge. No palpable penile mass   REVIEW OF SYSTEMS: Negative upon 10 system review other than what is mentioned in the HPI.  LAB RESULTS:  Results for orders placed or performed in visit on 07/07/23  Basic Metabolic Panel  Result Value Ref Range   Sodium 143 135 - 145 mmol/L   Potassium 4.8 3.4 - 4.8 mmol/L   Chloride 106 98  - 107 mmol/L   CO2 30.0 20.0 - 31.0 mmol/L   Anion Gap 7 5 - 14 mmol/L   BUN 15 9 - 23 mg/dL   Creatinine 8.42 (H) 9.26 - 1.18 mg/dL   BUN/Creatinine Ratio 10    eGFR CKD-EPI (2021) Male 48 (L) >=60 mL/min/1.32m2   Glucose 107 70 - 179 mg/dL   Calcium 9.4 8.7 - 89.5 mg/dL  CBC w/ Differential  Result Value Ref Range   WBC 5.9 3.6 - 11.2 10*9/L   RBC 3.73 (L) 4.26 - 5.60 10*12/L   HGB 12.3 (L) 12.9 - 16.5 g/dL   HCT 63.3 (L) 60.9 - 51.9 %   MCV 98.0 (H) 77.6 - 95.7 fL   MCH 32.9 (H) 25.9 - 32.4 pg   MCHC 33.6 32.0 - 36.0 g/dL   RDW 86.2 87.7 - 84.7 %   MPV 7.6 6.8 - 10.7 fL   Platelet 262 150 - 450 10*9/L   Neutrophils % 73.3 %   Lymphocytes % 10.9 %   Monocytes % 11.0 %   Eosinophils % 4.1 %   Basophils % 0.7 %   Absolute Neutrophils 4.3 1.8 - 7.8 10*9/L   Absolute Lymphocytes 0.6 (L) 1.1 - 3.6 10*9/L   Absolute Monocytes 0.6 0.3 - 0.8 10*9/L   Absolute  Eosinophils 0.2 0.0 - 0.5 10*9/L   Absolute Basophils 0.0 0.0 - 0.1 10*9/L   *Note: Due to a large number of results and/or encounters for the requested time period, some results have not been displayed. A complete set of results can be found in Results Review.

## 2024-02-09 NOTE — Telephone Encounter (Signed)
-------------------------------------------------------------------------------   Summary: WOCN -------------------------------------------------------------------------------  WOCN Consult Services  Telephone Call  Service:WOCN     Reason For Call: - Pouching problem  Assessment:  Pt reports the pouch placed 10/27 leaked within 24 hours - he believes this is because of multiple scars and creases around stoma.   Actions Taken: -  Samples requested Convatec -convexity - [id number] with [id number]  Instructions Provided: -  Sizing ostomy wafer -  Application of barrier ring -  Use of convexity -  Sent demonstration videos to patients email address.  - Requested a picture of stoma and pouching surface  Follow Up: -  Outpatient WOCN will follow-up with patient in 5 days  Workup Time:  30 minutes  Asberry Palma MSN RN [facility] Consult Services  [phone] [email or web address]    Thank you for your message. I am a [employer] employee and may not check messages daily. Please allow 2-3 days for a reply. If your concern is urgent please call the clinic at [phone]

## 2024-02-10 ENCOUNTER — Ambulatory Visit (HOSPITAL_COMMUNITY)
Admission: RE | Admit: 2024-02-10 | Discharge: 2024-02-10 | Disposition: A | Discharge status: Home / Self Care | Source: Ambulatory Visit

## 2024-02-10 DIAGNOSIS — C679 Malignant neoplasm of bladder, unspecified: Secondary | ICD-10-CM | POA: Diagnosis present

## 2024-02-10 DIAGNOSIS — C775 Secondary and unspecified malignant neoplasm of intrapelvic lymph nodes: Secondary | ICD-10-CM | POA: Diagnosis present

## 2024-02-10 MED ORDER — IOHEXOL 300 MG/ML  SOLN
100.0000 mL | Freq: Once | INTRAMUSCULAR | Status: AC | PRN
  Administered 2024-02-10: 80 mL via INTRAVENOUS

## 2024-02-10 MED ORDER — IOHEXOL 9 MG/ML PO SOLN: 1000.0000 mL | ORAL | Status: AC

## 2024-02-10 MED ORDER — IOHEXOL 9 MG/ML PO SOLN
ORAL | Status: AC
Start: 2024-02-10 — End: 2024-02-10
  Filled 2024-02-10: qty 1000

## 2024-02-10 MED ORDER — HEPARIN SOD (PORK) LOCK FLUSH 100 UNIT/ML IV SOLN
500.0000 [IU] | Freq: Once | INTRAVENOUS | Status: AC
  Administered 2024-02-10: 500 [IU] via INTRAVENOUS

## 2024-02-10 MED ORDER — SODIUM CHLORIDE (PF) 0.9 % IJ SOLN
INTRAMUSCULAR | Status: AC
  Filled 2024-02-10: qty 50

## 2024-02-13 ENCOUNTER — Encounter: Payer: Self-pay | Admitting: Radiology

## 2024-02-13 ENCOUNTER — Ambulatory Visit: Payer: Self-pay

## 2024-02-16 ENCOUNTER — Inpatient Hospital Stay

## 2024-02-16 ENCOUNTER — Other Ambulatory Visit (HOSPITAL_COMMUNITY): Payer: Self-pay

## 2024-02-16 VITALS — BP 145/64 | HR 80 | Temp 97.2°F | Resp 20 | Wt 192.4 lb

## 2024-02-16 DIAGNOSIS — Z5112 Encounter for antineoplastic immunotherapy: Secondary | ICD-10-CM | POA: Diagnosis present

## 2024-02-16 DIAGNOSIS — E538 Deficiency of other specified B group vitamins: Secondary | ICD-10-CM | POA: Diagnosis not present

## 2024-02-16 DIAGNOSIS — C679 Malignant neoplasm of bladder, unspecified: Secondary | ICD-10-CM

## 2024-02-16 DIAGNOSIS — K5909 Other constipation: Secondary | ICD-10-CM

## 2024-02-16 DIAGNOSIS — C775 Secondary and unspecified malignant neoplasm of intrapelvic lymph nodes: Secondary | ICD-10-CM

## 2024-02-16 DIAGNOSIS — C678 Malignant neoplasm of overlapping sites of bladder: Secondary | ICD-10-CM | POA: Diagnosis present

## 2024-02-16 DIAGNOSIS — Z1589 Genetic susceptibility to other disease: Secondary | ICD-10-CM

## 2024-02-16 DIAGNOSIS — Z79620 Long term (current) use of immunosuppressive biologic: Secondary | ICD-10-CM | POA: Insufficient documentation

## 2024-02-16 LAB — CMP (CANCER CENTER ONLY)
ALT: 5 U/L (ref 0–44)
AST: 11 U/L — ABNORMAL LOW (ref 15–41)
Albumin: 3.6 g/dL (ref 3.5–5.0)
Alkaline Phosphatase: 47 U/L (ref 38–126)
Anion gap: 3 — ABNORMAL LOW (ref 5–15)
BUN: 26 mg/dL — ABNORMAL HIGH (ref 8–23)
CO2: 29 mmol/L (ref 22–32)
Calcium: 8.6 mg/dL — ABNORMAL LOW (ref 8.9–10.3)
Chloride: 109 mmol/L (ref 98–111)
Creatinine: 2.05 mg/dL — ABNORMAL HIGH (ref 0.61–1.24)
GFR, Estimated: 35 mL/min — ABNORMAL LOW (ref >60)
Glucose, Bld: 105 mg/dL — ABNORMAL HIGH (ref 70–99)
Potassium: 4.4 mmol/L (ref 3.5–5.1)
Sodium: 141 mmol/L (ref 135–145)
Total Bilirubin: 0.4 mg/dL (ref 0.0–1.2)
Total Protein: 6.2 g/dL — ABNORMAL LOW (ref 6.5–8.1)

## 2024-02-16 LAB — CBC WITH DIFFERENTIAL (CANCER CENTER ONLY)
Abs Immature Granulocytes: 0.01 K/uL (ref 0.00–0.07)
Basophils Absolute: 0 K/uL (ref 0.0–0.1)
Basophils Relative: 1 %
Eosinophils Absolute: 0.2 K/uL (ref 0.0–0.5)
Eosinophils Relative: 6 %
HCT: 38.1 % — ABNORMAL LOW (ref 39.0–52.0)
Hemoglobin: 12.3 g/dL — ABNORMAL LOW (ref 13.0–17.0)
Immature Granulocytes: 0 %
Lymphocytes Relative: 20 %
Lymphs Abs: 0.8 K/uL (ref 0.7–4.0)
MCH: 31.5 pg (ref 26.0–34.0)
MCHC: 32.3 g/dL (ref 30.0–36.0)
MCV: 97.4 fL (ref 80.0–100.0)
Monocytes Absolute: 0.5 K/uL (ref 0.1–1.0)
Monocytes Relative: 12 %
Neutro Abs: 2.5 K/uL (ref 1.7–7.7)
Neutrophils Relative %: 61 %
Platelet Count: 189 K/uL (ref 150–400)
RBC: 3.91 MIL/uL — ABNORMAL LOW (ref 4.22–5.81)
RDW: 13.2 % (ref 11.5–15.5)
WBC Count: 4 K/uL (ref 4.0–10.5)
nRBC: 0 % (ref 0.0–0.2)

## 2024-02-16 LAB — VITAMIN B12: Vitamin B-12: 314 pg/mL (ref 180–914)

## 2024-02-16 MED ORDER — VITAMIN B-12 1000 MCG PO TABS
1000.0000 ug | ORAL_TABLET | Freq: Every day | ORAL | 3 refills | Status: AC
  Filled 2024-02-16: qty 90, 90d supply, fill #0

## 2024-02-16 MED ORDER — SODIUM CHLORIDE 0.9% FLUSH: 10.0000 mL | INTRAVENOUS | Status: DC | PRN

## 2024-02-16 MED ORDER — DOCUSATE SODIUM 100 MG PO CAPS
100.0000 mg | ORAL_CAPSULE | Freq: Two times a day (BID) | ORAL | 1 refills | Status: AC | PRN
Start: 2024-02-16 — End: ?

## 2024-02-16 MED ORDER — SODIUM CHLORIDE 0.9 % IV SOLN
480.0000 mg | Freq: Once | INTRAVENOUS | Status: AC
  Administered 2024-02-16: 480 mg via INTRAVENOUS
  Filled 2024-02-16: qty 48

## 2024-02-16 MED ORDER — POLYETHYLENE GLYCOL 3350 17 G PO PACK: 17.0000 g | PACK | Freq: Every day | ORAL | 0 refills | Status: AC

## 2024-02-16 MED ORDER — SODIUM CHLORIDE 0.9 % IV SOLN: INTRAVENOUS | Status: DC

## 2024-02-16 NOTE — Assessment & Plan Note (Addendum)
 Continue treatment this week  return in 4 weeks with lab, visit and then infusion Repeat CT showed NED on 10/31

## 2024-02-16 NOTE — Patient Instructions (Signed)
 [facility] - A DEPT OF [facility]  Discharge Instructions: Thank you for choosing [facility] to provide your oncology and hematology care.   If you have a lab appointment with the Cancer Center, please go directly to the Cancer Center and check in at the registration area.   Wear comfortable clothing and clothing appropriate for easy access to any Portacath or PICC line.   We strive to give you quality time with your provider. You may need to reschedule your appointment if you arrive late (15 or more minutes).  Arriving late affects you and other patients whose appointments are after yours.  Also, if you miss three or more appointments without notifying the office, you may be dismissed from the clinic at the provider's discretion.      For prescription refill requests, have your pharmacy contact our office and allow 72 hours for refills to be completed.    Today you received the following chemotherapy and/or immunotherapy agents: Nivolumab.       To help prevent nausea and vomiting after your treatment, we encourage you to take your nausea medication as directed.  BELOW ARE SYMPTOMS THAT SHOULD BE REPORTED IMMEDIATELY: *FEVER GREATER THAN 100.4 F (38 C) OR HIGHER *CHILLS OR SWEATING *NAUSEA AND VOMITING THAT IS NOT CONTROLLED WITH YOUR NAUSEA MEDICATION *UNUSUAL SHORTNESS OF BREATH *UNUSUAL BRUISING OR BLEEDING *URINARY PROBLEMS (pain or burning when urinating, or frequent urination) *BOWEL PROBLEMS (unusual diarrhea, constipation, pain near the anus) TENDERNESS IN MOUTH AND THROAT WITH OR WITHOUT PRESENCE OF ULCERS (sore throat, sores in mouth, or a toothache) UNUSUAL RASH, SWELLING OR PAIN  UNUSUAL VAGINAL DISCHARGE OR ITCHING   Items with * indicate a potential emergency and should be followed up as soon as possible or go to the Emergency Department if any problems should occur.  Please show the CHEMOTHERAPY ALERT CARD or  IMMUNOTHERAPY ALERT CARD at check-in to the Emergency Department and triage nurse.  Should you have questions after your visit or need to cancel or reschedule your appointment, please contact [facility] - A DEPT OF [facility]  Dept: [phone]  and follow the prompts.  Office hours are 8:00 a.m. to 4:30 p.m. Monday - Friday. Please note that voicemails left after 4:00 p.m. may not be returned until the following business day.  We are closed weekends and major holidays. You have access to a nurse at all times for urgent questions. Please call the main number to the clinic Dept: [phone] and follow the prompts.   For any non-urgent questions, you may also contact your provider using MyChart. We now offer e-Visits for anyone 21 and older to request care online for non-urgent symptoms. For details visit [email or web address].   Also download the MyChart app! Go to the app store, search "MyChart", open the app, select [place], and log in with your MyChart username and password.

## 2024-02-16 NOTE — Assessment & Plan Note (Addendum)
 Referral to GI was placed last time. Will follow up as he has not heard from them.

## 2024-02-16 NOTE — Progress Notes (Signed)
 [facility] OFFICE PROGRESS NOTE  Patient Care Team: [name] LABOR, MD as PCP - General (Family Medicine)  Todd [name] is a 68 y.o.male with history of colon cancer, MIBC, MLH1 mutation, COPD, back fusion with chronic back pain being follow up with for urothelial carcinoma.    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 After improvement of PS, started adjuvant nivolumab  on 10/13/23  CT showed NED on 10/31.  Discussed result with patient. Overall feeling well. No side effect to prevent from treatment today.  Of note, report lost his wallet and need signature from me on his current medication to obtain a replacement license.   Constipation management discussed. Prescriptions sent per request. 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 Will send to [facility]. Lab pending  MLH1 gene mutation Referral to GI was placed last time. Will follow up as he has not heard from them. Other constipation Colace twice a daily as needed. Adding fiber few times a day Miralax  daily as needed   Todd [name], MD  INTERVAL HISTORY: Patient returns for follow-up. Report of constipation. He eats out a lot. Report is not dark red or bloody.  Report about 3 weeks ago he had a little urine and bag was filled with air. It stopped and started to urinate normally.  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  2016-2018 He then had  multiple other TURBTs with intermittent adherence/tolerability to BCG     05/27/2014 Procedure   Repeat TURBT, path again with LgTa. Only completed 2/6 BCG treatments, then lost to follow-up.    06/09/2015 Procedure   Repeat TURBT with 3 papillary tumors, pathology with LgTa and CIS.    06/2015 Imaging   CT A/P with diffuse bladder wall thickening, no definitive evidence of metastatic disease    07/21/2015 Procedure   Repeat TURBT with path showing high grade dysplasia. Again scheduled for BCG but only completed 1-2 doses.    10/19/2016 Procedure   Repeat TURBT with low grade papillary urothelial carcinoma/CIS. Received 4/6 doses of BCG. Lost to follow-up.    03/09/2019 Imaging   CT CAP with LUL 2cm GGO, minimal right urinary bladder wall thickening of 4mm    01/22/2020 Imaging   CT A/P in the ED for N/V/D/weight loss. Bladder thickening more pronounced at 7mm.    07-01-2020 Imaging   CT urogram for hematuria showed new right lateral masslike thickening of bladder wall, measure 8mm. No evidence metastatic disease.    08/13/2020 Procedure   Repeat TURBT with invasive high-grade urothelial carcinoma with nonfocal invasion into lamina propria (CIS present), second specimen with invasive high-grade urothelial carcinoma extensively involving lamina propria, with one fragment showing involvement of muscle bundles which are equivocal for muscularis mucosae or muscularis propria (CIS again present).    09/19/2020 Procedure   Repeat TURBT for re-resection with invasive high-grade urothelial carcinoma with non-focal lamina propria invasion and CIS, no involvement of muscularis propria    11/20/2020 - 09/17/2021 Chemotherapy   C1 - 8 Pembrolizumab  12/05/2022 Imaging   MRI pelvis wo contrast -Increased size of right posterolateral bladder wall mass, now measuring up to 3.2 cm, previously 1.2 cm, with extramural extension of the mass encasing the distal right ureter and resulting in upstream right  ureteral dilation. The extravesicular extension of the bladder mass also is in close contact with the vas deferens near the seminal vesicles for which abutment or invasion cannot be excluded.     01/03/2023 Imaging   CT AP -Increasing right posterior lateral bladder wall thickening, concerning for disease progression. New right ureteral obstruction associated hydroureteronephrosis.  -Multiple subcentimeter hypoattenuating foci within the liver, favored to be benign given stability since 2016.   CT Chest: No intrathoracic metastasis with stable findings since December 28.     01/17/2023 PET scan   PET from [facility] - Thickening of the right posterolateral bladder wall, similar to prior CT dated 01/03/2023.  - Minimally FDG avid bilateral inguinal lymph nodes which do not appear pathologically enlarged or abnormal in appearance. These are favored to be reactive.  -There is a possible mildly hypermetabolic nodule along the posterior wall of the rectum which is not completely characterized on this study. Suggest correlation with colonoscopy, which patient has scheduled 01/20/2023.    02/07/2023 Pathology Results   A: Ureter margin, right distal, biopsy - Benign ureter with chronic inflammation - Negative for malignancy   B: Ureter margin, left distal, biopsy - Focal atypia, worrisome for focal involvement by urothelial carcinoma in situ   C: Bladder and prostate, cystoprostatectomy   BLADDER - Multifocal invasive high grade papillary urothelial carcinoma              - Dominant tumor is in right posterior upper bladder wall, 3.0 cm, invasive through muscularis propria with macroscopic invasion of peri-vesicle adipose tissue              - Satellite tumor in left bladder extending into left ureter, 0.9 cm, invasive into lamina propria - Extensive multifocal high grade papillary urothelial carcinoma and urothelial carcinoma in situ present in all random sections of the bladder mucosa (anterior,  posterior, dome, trigone, right lateral, left lateral walls) away from invasive cancers - 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 (contiguous with carcinoma in situ of trigone), distal margin uninvolved - Margins of resection negative for invasive carcinoma - Left ureter margin of this specimen shows urothelial carcinoma in situ, superceded by additional specimens, final left ureter margin is in specimen F, which shows patchy atypia 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 - See synoptic report    PROSTATE - 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 and involves prostatic ducts - Prostatic stroma with no evidence of involvement by urothelial carcinoma, negative for prostate carcinoma - Prostatic urethral distal margin negative   D: Lymph node, right pelvic, biopsy - Five lymph nodes with no evidence of malignancy (0/5)    E: Lymph node, left pelvic, biopsy - Metastatic carcinoma present in one of five lymph nodes (1/5), size of metastatic deposit 1.4 mm, without extranodal extension   F: True ureteral margin, left distal, biopsy - 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 - Negative for invasive carcinoma   G: True ureteral margin, right distal, biopsy - Benign ureter with chronic inflammation - Negative for malignancy  SPECIMEN    Procedure:    Radical cystoprostatectomy   TUMOR    Tumor Site:    Right lateral wall     Tumor Site:  Left lateral wall     Tumor Site:    Posterior wall     Histologic Type:    Urothelial carcinoma, invasive (conventional)     Histologic Grade:    High-grade     Tumor Size:    Greatest Dimension (Centimeters): 3.0 cm       Additional Dimension (Centimeters):    2.9 cm       Additional Dimension (Centimeters):    2.8 cm     Tumor Extent:    Invades perivesical soft tissue microscopically     Lymphatic and / or Vascular Invasion:    Not identified     Tumor Configuration:     Papillary     Tumor Configuration:    Solid / nodule     Tumor Configuration:    Flat     Treatment Effect Post Neoadjuvant Chemotherapy:    Weak and no response: residual cancer cells occupying greater than or equal to 50% of the tumor bed or absence of regressive changes (TRG3)     Tumor Comment:    Appears to have received 1 dose of pembrolizumab, no apparent histologic response.   MARGINS    Margin Status for Invasive Tumor:    All margins negative for invasive tumor       Closest Margin(s) to Invasive tumor:    Soft tissue: right peri-vesicular margin       Distance from Invasive Tumor to Closest Margin:    13 mm     Margin Status for Carcinoma in Situ / Noninvasive Papillary Urothelial Carcinoma:    Carcinoma in situ / noninvasive papillary urothelial carcinoma present at margin       Margin(s) Involved by Carcinoma in Situ / Noninvasive Papillary Urothelial Carcinoma:    Left ureteral   REGIONAL LYMPH NODES     Regional Lymph Node Status:          :    Tumor present in regional lymph node(s)         Number of Lymph Nodes with Tumor:    1         Size of Largest Nodal Metastatic Deposit:    0.14 cm         Nodal Site with Largest Metastatic Deposit:    Left pelvic         Size of Largest Lymph Node with Tumor:    0.7 cm         Largest Lymph Node with Tumor:    left pelvic         Extranodal Extension (ENE):    Not identified       Number of Lymph Nodes Examined:    10   pTNM CLASSIFICATION (AJCC 8th Edition)     Reporting of pT, pN, and (when applicable) pM categories is based on information available to the pathologist at the time the report is issued. As per the AJCC (Chapter 1, 8th Ed.) it is the managing physician's responsibility to establish the final pathologic stage based upon all pertinent information, including but potentially not limited to this pathology report.     Modified Classification:    y     pT Category:    pT3a     T Suffix:    (m)     pN Category:    pN1     02/20/2023 Procedure   Colonoscopy Impression:    - The examined portion of the ileum was normal.                         -  The entire examined colon is normal on direct and retroflexion views.                         - Views were limited by liquid stool throughout the colon and difficulty suctioning the stool.                         - No specimens collected.    02/23/2023 Imaging   CT AP Hepatobiliary: Hypodensities in liver consistent benign cysts. Postcholecystectomy. Mild extrahepatic biliary duct dilatation not changed from prior.   03/03/2023 -  Chemotherapy   First visit to [facility] Med onc. Initially planned for chemoradiation. Due to decrease PS and recurrent hospitalization, did not pursue chemotherapy. Discussed pending PS to consider adjuvant nivolumab  after radiation.     04/06/2023 Imaging   CT AP from [facility] Relative hypoenhancement of the right kidney when compared to the left which is nonspecific. This could be related to altered perfusion or infection/pyelonephritis. No significant hydronephrosis is identified. Clinical correlation is recommended.  LIVER: Normal liver contour. Subcentimeter hypoattenuating hepatic foci too small to characterize further on CT.    05/09/2023 - 06/10/2023 Radiation Therapy   Adjuvant IMRT to bladder 45 Gy/ 25 fxs   05/10/2023 Cancer Staging   Staging form: Urinary Bladder, AJCC 8th Edition - Clinical: Stage IIIA (cT3, cN1, cM0) - Signed by Tina Todd BROCKS, MD on 05/10/2023 WHO/ISUP grade (low/high): High Grade Histologic grading system: 2 grade system   05/19/2023 Imaging   Presented to ED with abdominal pain CT AP: IMPRESSION: 1. Surgical resection of the bladder and prostate with ileal conduit present in the right lower quadrant. 2. No evidence of metastatic disease. 3. Mild delay of nephrogram on the right without hydronephrosis, possibly renovascular disease. 4. Aortic atherosclerosis.  CT chest: no metastases   08/23/2023  Imaging   CT AP IMPRESSION: Postsurgical changes as described above.   No acute abnormality is noted   09/12/2023 Imaging   CT chest: NED   10/13/2023 -  Chemotherapy   Patient is on Treatment Plan : BLADDER Nivolumab  (480) q28d        PHYSICAL EXAMINATION: ECOG PERFORMANCE STATUS: 1 - Symptomatic but completely ambulatory  Vitals:   02/16/24 1400  BP: (!) 145/64  Pulse: 80  Resp: 20  Temp: (!) 97.2 F (36.2 C)  SpO2: 98%   Filed Weights   02/16/24 1400  Weight: 192 lb 6.4 oz (87.3 kg)    GENERAL: alert, no distress and comfortable SKIN: skin color normal and no rash on exposed skin LUNGS: clear to auscultation and no wheeze or rales with normal breathing effort HEART: regular rate & rhythm  ABDOMEN: abdomen soft, non-tender and nondistended. Musculoskeletal: no edema  Relevant data reviewed during this visit included labs.  New labs ordered.

## 2024-02-16 NOTE — Assessment & Plan Note (Addendum)
 Colace twice a daily as needed. Adding fiber few times a day Miralax  daily as needed

## 2024-02-16 NOTE — Assessment & Plan Note (Addendum)
 Report cannot fill at [facility]. Will send to [facility]. Lab pending

## 2024-03-13 NOTE — Progress Notes (Deleted)
 [facility] OFFICE PROGRESS NOTE  [name] LABOR, MD 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  2016-2018 He then had multiple other TURBTs with intermittent adherence/tolerability to BCG     05/27/2014 Procedure   Repeat TURBT, path again with LgTa. Only completed 2/6 BCG treatments, then lost to follow-up.    06/09/2015 Procedure   Repeat TURBT with 3 papillary tumors, pathology with LgTa and CIS.    06/2015 Imaging   CT A/P with diffuse bladder wall thickening, no definitive evidence of metastatic disease    07/21/2015 Procedure   Repeat TURBT with path showing high grade dysplasia. Again scheduled for BCG but only completed 1-2 doses.    10/19/2016 Procedure   Repeat TURBT with low grade papillary urothelial carcinoma/CIS. Received 4/6 doses of BCG. Lost to follow-up.    03/09/2019 Imaging   CT CAP with LUL 2cm GGO, minimal right urinary bladder wall thickening of 4mm    01/22/2020 Imaging   CT A/P in the ED for N/V/D/weight loss. Bladder thickening more pronounced at 7mm.    07-15-2020 Imaging   CT urogram for hematuria showed new right lateral masslike thickening of bladder wall, measure 8mm. No evidence metastatic disease.    08/13/2020 Procedure   Repeat TURBT with invasive high-grade urothelial carcinoma with nonfocal invasion into lamina propria (CIS present), second specimen with invasive high-grade urothelial carcinoma extensively involving lamina propria, with one fragment showing involvement of muscle bundles which are equivocal for muscularis mucosae or muscularis propria (CIS again present).    09/19/2020 Procedure   Repeat TURBT  for re-resection with invasive high-grade urothelial carcinoma with non-focal lamina propria invasion and CIS, no involvement of muscularis propria    11/20/2020 - 09/17/2021 Chemotherapy   C1 - 8 Pembrolizumab    12/05/2022 Imaging   MRI pelvis wo contrast -Increased size of right posterolateral bladder wall mass, now measuring up to 3.2 cm, previously 1.2 cm, with extramural extension of the mass encasing the distal right ureter and resulting in upstream right ureteral dilation. The extravesicular extension of the bladder mass also is in close contact with the vas deferens near the seminal vesicles for which abutment or invasion cannot be excluded.     01/03/2023 Imaging   CT AP -Increasing right posterior lateral bladder wall thickening, concerning for disease progression. New right ureteral obstruction associated hydroureteronephrosis.  -Multiple subcentimeter hypoattenuating foci within the liver, favored to be benign given stability since 2016.   CT Chest: No intrathoracic metastasis with stable findings since December 28.     01/17/2023 PET scan   PET from [facility] - Thickening of the right posterolateral bladder wall, similar to prior CT dated 01/03/2023.  - Minimally FDG avid bilateral inguinal lymph nodes which do not appear pathologically enlarged or abnormal in appearance. These are favored to be reactive.  -There is a possible mildly hypermetabolic nodule along the posterior wall of the rectum which is not completely characterized on this study. Suggest correlation with colonoscopy, which patient has scheduled 01/20/2023.    02/07/2023 Pathology Results   A: Ureter margin, right distal, biopsy - Benign ureter with chronic inflammation - Negative for malignancy   B: Ureter margin, left  distal, biopsy - Focal atypia, worrisome for focal involvement by urothelial carcinoma in situ   C: Bladder and prostate, cystoprostatectomy   BLADDER - Multifocal invasive high grade papillary  urothelial carcinoma              - Dominant tumor is in right posterior upper bladder wall, 3.0 cm, invasive through muscularis propria with macroscopic invasion of peri-vesicle adipose tissue              - Satellite tumor in left bladder extending into left ureter, 0.9 cm, invasive into lamina propria - Extensive multifocal high grade papillary urothelial carcinoma and urothelial carcinoma in situ present in all random sections of the bladder mucosa (anterior, posterior, dome, trigone, right lateral, left lateral walls) away from invasive cancers - 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 (contiguous with carcinoma in situ of trigone), distal margin uninvolved - Margins of resection negative for invasive carcinoma - Left ureter margin of this specimen shows urothelial carcinoma in situ, superceded by additional specimens, final left ureter margin is in specimen F, which shows patchy atypia 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 - See synoptic report    PROSTATE - 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 and involves prostatic ducts - Prostatic stroma with no evidence of involvement by urothelial carcinoma, negative for prostate carcinoma - Prostatic urethral distal margin negative   D: Lymph node, right pelvic, biopsy - Five lymph nodes with no evidence of malignancy (0/5)    E: Lymph node, left pelvic, biopsy - Metastatic carcinoma present in one of five lymph nodes (1/5), size of metastatic deposit 1.4 mm, without extranodal extension   F: True ureteral margin, left distal, biopsy - 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 - Negative for invasive carcinoma   G: True ureteral margin, right distal, biopsy - Benign ureter with chronic inflammation - Negative for malignancy  SPECIMEN    Procedure:    Radical cystoprostatectomy   TUMOR    Tumor Site:    Right lateral wall     Tumor Site:    Left lateral wall      Tumor Site:    Posterior wall     Histologic Type:    Urothelial carcinoma, invasive (conventional)     Histologic Grade:    High-grade     Tumor Size:    Greatest Dimension (Centimeters): 3.0 cm       Additional Dimension (Centimeters):    2.9 cm       Additional Dimension (Centimeters):    2.8 cm     Tumor Extent:    Invades perivesical soft tissue microscopically     Lymphatic and / or Vascular Invasion:    Not identified     Tumor Configuration:    Papillary     Tumor Configuration:    Solid / nodule     Tumor Configuration:    Flat     Treatment Effect Post Neoadjuvant Chemotherapy:    Weak and no response: residual cancer cells occupying greater than or equal to 50% of the tumor bed or absence of regressive changes (TRG3)     Tumor Comment:    Appears to have received 1 dose of pembrolizumab, no apparent histologic response.   MARGINS    Margin Status for Invasive Tumor:    All margins negative for invasive tumor       Closest Margin(s) to Invasive tumor:    Soft tissue: right peri-vesicular margin  Distance from Invasive Tumor to Closest Margin:    13 mm     Margin Status for Carcinoma in Situ / Noninvasive Papillary Urothelial Carcinoma:    Carcinoma in situ / noninvasive papillary urothelial carcinoma present at margin       Margin(s) Involved by Carcinoma in Situ / Noninvasive Papillary Urothelial Carcinoma:    Left ureteral   REGIONAL LYMPH NODES     Regional Lymph Node Status:          :    Tumor present in regional lymph node(s)         Number of Lymph Nodes with Tumor:    1         Size of Largest Nodal Metastatic Deposit:    0.14 cm         Nodal Site with Largest Metastatic Deposit:    Left pelvic         Size of Largest Lymph Node with Tumor:    0.7 cm         Largest Lymph Node with Tumor:    left pelvic         Extranodal Extension (ENE):    Not identified       Number of Lymph Nodes Examined:    10   pTNM CLASSIFICATION (AJCC 8th Edition)     Reporting of pT,  pN, and (when applicable) pM categories is based on information available to the pathologist at the time the report is issued. As per the AJCC (Chapter 1, 8th Ed.) it is the managing physician's responsibility to establish the final pathologic stage based upon all pertinent information, including but potentially not limited to this pathology report.     Modified Classification:    y     pT Category:    pT3a     T Suffix:    (m)     pN Category:    pN1    02/20/2023 Procedure   Colonoscopy Impression:    - The examined portion of the ileum was normal.                         - The entire examined colon is normal on direct and retroflexion views.                         - Views were limited by liquid stool throughout the colon and difficulty suctioning the stool.                         - No specimens collected.    02/23/2023 Imaging   CT AP Hepatobiliary: Hypodensities in liver consistent benign cysts. Postcholecystectomy. Mild extrahepatic biliary duct dilatation not changed from prior.   03/03/2023 -  Chemotherapy   First visit to [facility]. Initially planned for chemoradiation. Due to decrease PS and recurrent hospitalization, did not pursue chemotherapy. Discussed pending PS to consider adjuvant nivolumab  after radiation.     04/06/2023 Imaging   CT AP from [facility] Relative hypoenhancement of the right kidney when compared to the left which is nonspecific. This could be related to altered perfusion or infection/pyelonephritis. No significant hydronephrosis is identified. Clinical correlation is recommended.  LIVER: Normal liver contour. Subcentimeter hypoattenuating hepatic foci too small to characterize further on CT.    05/09/2023 - 06/10/2023 Radiation Therapy   Adjuvant IMRT to bladder 45 Gy/ 25 fxs   05/10/2023  Cancer Staging   Staging form: Urinary Bladder, AJCC 8th Edition - Clinical: Stage IIIA (cT3, cN1, cM0) - Signed by Tina Pauletta BROCKS, MD on 05/10/2023 WHO/ISUP grade  (low/high): High Grade Histologic grading system: 2 grade system   05/19/2023 Imaging   Presented to ED with abdominal pain CT AP: IMPRESSION: 1. Surgical resection of the bladder and prostate with ileal conduit present in the right lower quadrant. 2. No evidence of metastatic disease. 3. Mild delay of nephrogram on the right without hydronephrosis, possibly renovascular disease. 4. Aortic atherosclerosis.  CT chest: no metastases   08/23/2023 Imaging   CT AP IMPRESSION: Postsurgical changes as described above.   No acute abnormality is noted   09/12/2023 Imaging   CT chest: NED   10/13/2023 -  Chemotherapy   Patient is on Treatment Plan : BLADDER Nivolumab  (480) q28d        PRIOR THERAPY: 1) Cystectomy, ureteral ileal conduit, bilateral pelvic lymph node dissection, prostatectomy  2) 1/27-2/28/25 Adjuvant radiation   CURRENT THERAPY: Adjuvant Nivolumab  480 mg IV every 4 weeks. Today is cycle #8  INTERVAL HISTORY: Todd [name]. 68 y.o. male returns to the clinic today for a follow up visit. The patient was last seen in the clinic by Dr. Tina on 02/16/24.   The patient is currently undergoing adjuvant immunotherapy with immunotherapy with nivolumab . He is tolerateing this well without any concerning adverse side effects.  The patient is feeling fairly well today.  He denies any changes in his energy.  Dr. Tina referred the patient to GI at the last appointment due to MLH1 gene mutation.  Make sure the patient was able to pick up B12 supplement?  The patient follows with the ostomy clinic.   The patient denies any fever, chills, night sweats, or unexplained weight loss.  The patient has a good appetite.  The patient denies any shortness of breath, chest pain, cough, or hemoptysis.  Denies any nausea, vomiting, diarrhea, or constipation.  Denies any abdominal pain.  Denies any urinary symptoms.  Denies any rashes or skin changes.  Denies any headache or visual changes.  The  patient is here today for evaluation and repeat blood work before undergoing cycle #8.    MEDICAL HISTORY: Past Medical History:  Diagnosis Date   Allergy    Anxiety    Arthritis    Asthma    Bladder cancer (HCC) dx'd 10/2013   recurrent   Chronic gastritis    Chronic low back pain    Colon cancer (HCC) dx'd 09/2011   COPD (chronic obstructive pulmonary disease) (HCC)    Depression    Diverticulosis    Dysuria    GERD (gastroesophageal reflux disease)    History of acute pancreatitis    due to SIRS   History of adenomatous polyp of colon    04-04-2015  last colonoscopy w/ multiple polyps--  tubular adenomas, beign polypoid colonic mucous   History of bladder cancer monitory by  dr Ceil   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   History of colon cancer oncologist-- dr cloretta--  dx  with Hereditary non-polyposis colon cancer syndrome,  MLH1 gene mutation--     dx june 2013--  stage II, T3,N0--  s/p  transverse colectomy--  completed 2 cycles  chemotherapy--  NO RECURRENCE in clinical remission   Hypertension    Lactose intolerance    Lower urinary tract symptoms (LUTS)    Lynch syndrome    MLH1 gene mutation  Short of breath on exertion    Wears glasses     ALLERGIES:  is allergic to clindamycin /lincomycin, milk-related compounds, and hydrocodone.  MEDICATIONS:  Current Outpatient Medications  Medication Sig Dispense Refill   acetaminophen  (TYLENOL ) 500 MG tablet Take 1,000 mg by mouth in the morning, at noon, and at bedtime.     albuterol (PROVENTIL HFA;VENTOLIN HFA) 108 (90 BASE) MCG/ACT inhaler Inhale 1 puff into the lungs every 6 (six) hours as needed for wheezing or shortness of breath.     aspirin 81 MG chewable tablet Chew 81 mg by mouth daily.     atorvastatin (LIPITOR) 80 MG tablet Take 1 tablet by mouth daily.     bisacodyl (DULCOLAX) 10 MG suppository Place 10 mg rectally daily as needed for moderate constipation.     budesonide-formoterol  (SYMBICORT) 160-4.5 MCG/ACT inhaler Inhale 2 puffs into the lungs 2 (two) times daily as needed (for respiratory flares).     cyanocobalamin  (VITAMIN B12) 1000 MCG tablet Take 1 tablet (1,000 mcg total) by mouth daily. 90 tablet 3   diclofenac Sodium (VOLTAREN) 1 % GEL Apply 2 g topically 4 (four) times daily as needed (pain).     docusate sodium  (COLACE) 100 MG capsule Take 1 capsule (100 mg total) by mouth 2 (two) times daily as needed for mild constipation. 60 capsule 1   DULoxetine (CYMBALTA) 60 MG capsule Take 1 capsule by mouth daily.     gabapentin  (NEURONTIN ) 100 MG capsule Take 100 mg by mouth at bedtime. Bedtime dose = 400 mg     gabapentin  (NEURONTIN ) 300 MG capsule Take 300 mg by mouth 3 (three) times daily.     lidocaine  (LIDODERM ) 5 % Place 1 patch onto the skin daily. Remove & Discard patch within 12 hours or as directed by MD     lidocaine -prilocaine  (EMLA ) cream Apply 1 Application topically as needed. 30 g 0   melatonin 3 MG TABS tablet Take 3 mg by mouth at bedtime as needed (sleep).     mirtazapine (REMERON) 7.5 MG tablet Take 7.5 mg by mouth at bedtime.     morphine  (AVINZA ) 30 MG 24 hr capsule Take 30 mg by mouth in the morning and at bedtime.     Multiple Vitamin (MULTIVITAMIN WITH MINERALS) TABS tablet Take 1 tablet by mouth daily.     Oxycodone  HCl 10 MG TABS Take 20 mg by mouth every 4 (four) hours as needed (moderate to severe pain).     pantoprazole  (PROTONIX ) 40 MG tablet Take 1 tablet (40 mg total) by mouth 2 (two) times daily. (Patient taking differently: Take 40 mg by mouth daily.) 180 tablet 0   polyethylene glycol (MIRALAX  / GLYCOLAX ) 17 g packet Take 17 g by mouth daily. 14 each 0   tiZANidine (ZANAFLEX) 2 MG tablet Take 2 mg by mouth every 6 (six) hours as needed for muscle spasms.     valsartan  (DIOVAN ) 160 MG tablet Take 160 mg by mouth daily.     valsartan -hydrochlorothiazide  (DIOVAN -HCT) 160-12.5 MG tablet Take 1 tablet by mouth daily.     No current  facility-administered medications for this visit.    SURGICAL HISTORY:  Past Surgical History:  Procedure Laterality Date   CHOLECYSTECTOMY  2013   COLON SURGERY  06/ 2013   Transverse   COLONOSCOPY N/A 12/04/2013   Procedure: COLONOSCOPY;  Surgeon: Lamar JONETTA Aho, MD;  Location: [facility];  Service: Endoscopy;  Laterality: N/A;   COLONOSCOPY WITH ESOPHAGOGASTRODUODENOSCOPY (EGD)  last one 04-04-2015  polypecotmy's gastric and colon    CYSTOSCOPY W/ RETROGRADES Bilateral 06/06/2015   Procedure: BILATERAL  RETROGRADE PYELOGRAM;  Surgeon: Mark Ottelin, MD;  Location: [facility];  Service: Urology;  Laterality: Bilateral;   ERCP N/A 01/01/2014   Procedure: ENDOSCOPIC RETROGRADE CHOLANGIOPANCREATOGRAPHY (ERCP);  Surgeon: Lamar JONETTA Aho, MD;  Location: [facility];  Service: Endoscopy;  Laterality: N/A;  with spyglass - preprocedure ATB's   IR IMAGING GUIDED PORT INSERTION  11/14/2023   LUMBAR DISC SURGERY  x4   last one 2001   fusion L5 -- S1   SPYGLASS CHOLANGIOSCOPY N/A 01/01/2014   Procedure: SPYGLASS CHOLANGIOSCOPY;  Surgeon: Lamar JONETTA Aho, MD;  Location: [facility];  Service: Endoscopy;  Laterality: N/A;   TONSILLECTOMY  age 54   TRANSURETHRAL RESECTION OF BLADDER TUMOR WITH GYRUS (TURBT-GYRUS) N/A 10/26/2013   Procedure: TRANSURETHRAL RESECTION OF BLADDER TUMOR WITH GYRUS (TURBT-GYRUS) ;  Surgeon: Mark C Ottelin, MD;  Location: [facility];  Service: Urology;  Laterality: N/A;   TRANSURETHRAL RESECTION OF BLADDER TUMOR WITH GYRUS (TURBT-GYRUS) N/A 05/27/2014   Procedure: TRANSURETHRAL RESECTION OF BLADDER TUMOR  AND TRANSURETHERAL BIOPSY OF PROSTATE;  Surgeon: Mark C Ottelin, MD;  Location: [facility];  Service: Urology;  Laterality: N/A;   TRANSURETHRAL RESECTION OF BLADDER TUMOR WITH GYRUS (TURBT-GYRUS) N/A 06/06/2015   Procedure: TRANSURETHRAL RESECTION OF BLADDER TUMOR WITH GYRUS (TURBT-GYRUS);  Surgeon: Mark Ottelin, MD;  Location: [facility];  Service: Urology;  Laterality: N/A;    REVIEW OF SYSTEMS:   Review of Systems  Constitutional: Negative for appetite change, chills, fatigue, fever and unexpected weight change.  HENT:   Negative for mouth sores, nosebleeds, sore throat and trouble swallowing.   Eyes: Negative for eye problems and icterus.  Respiratory: Negative for cough, hemoptysis, shortness of breath and wheezing.   Cardiovascular: Negative for chest pain and leg swelling.  Gastrointestinal: Negative for abdominal pain, constipation, diarrhea, nausea and vomiting.  Genitourinary: Negative for bladder incontinence, difficulty urinating, dysuria, frequency and hematuria.   Musculoskeletal: Negative for back pain, gait problem, neck pain and neck stiffness.  Skin: Negative for itching and rash.  Neurological: Negative for dizziness, extremity weakness, gait problem, headaches, light-headedness and seizures.  Hematological: Negative for adenopathy. Does not bruise/bleed easily.  Psychiatric/Behavioral: Negative for confusion, depression and sleep disturbance. The patient is not nervous/anxious.     PHYSICAL EXAMINATION:  There were no vitals taken for this visit.  ECOG PERFORMANCE STATUS: {CHL ONC ECOG [id number]  Physical Exam  Constitutional: Oriented to person, place, and time and well-developed, well-nourished, and in no distress. No distress.  HENT:  Head: Normocephalic and atraumatic.  Mouth/Throat: Oropharynx is clear and moist. No oropharyngeal exudate.  Eyes: Conjunctivae are normal. Right eye exhibits no discharge. Left eye exhibits no discharge. No scleral icterus.  Neck: Normal range of motion. Neck supple.  Cardiovascular: Normal rate, regular rhythm, normal heart sounds and intact distal pulses.   Pulmonary/Chest: Effort normal and breath sounds normal. No respiratory distress. No wheezes. No rales.  Abdominal: Soft. Bowel sounds are normal. Exhibits no distension and no mass. There is no  tenderness.  Musculoskeletal: Normal range of motion. Exhibits no edema.  Lymphadenopathy:    No cervical adenopathy.  Neurological: Alert and oriented to person, place, and time. Exhibits normal muscle tone. Gait normal. Coordination normal.  Skin: Skin is warm and dry. No rash noted. Not diaphoretic. No erythema. No pallor.  Psychiatric: Mood, memory and judgment normal.  Vitals reviewed.  LABORATORY DATA:  Lab Results  Component Value Date   WBC 4.0 02/16/2024   HGB 12.3 (L) 02/16/2024   HCT 38.1 (L) 02/16/2024   MCV 97.4 02/16/2024   PLT 189 02/16/2024      Chemistry      Component Value Date/Time   NA 141 02/16/2024 1340   NA 140 01/29/2014 0937   K 4.4 02/16/2024 1340   K 4.1 01/29/2014 0937   CL 109 02/16/2024 1340   CO2 29 02/16/2024 1340   CO2 26 01/29/2014 0937   BUN 26 (H) 02/16/2024 1340   BUN 16.1 01/29/2014 0937   CREATININE 2.05 (H) 02/16/2024 1340   CREATININE 1.0 01/29/2014 0937      Component Value Date/Time   CALCIUM 8.6 (L) 02/16/2024 1340   CALCIUM 9.0 01/29/2014 0937   ALKPHOS 47 02/16/2024 1340   ALKPHOS 86 01/29/2014 0937   AST 11 (L) 02/16/2024 1340   AST 27 01/29/2014 0937   ALT 5 02/16/2024 1340   ALT 29 01/29/2014 0937   BILITOT 0.4 02/16/2024 1340   BILITOT 0.36 01/29/2014 0937       RADIOGRAPHIC STUDIES:  No results found.   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 I will release more appointment requests.  Most recent CT showed NED on 10/31  Low serum vitamin B12 - The patient is taking this ***   MLH1 gene mutation Referral to GI was placed at a prior appointment. Will follow up as he has not heard from them. - ***  Other constipation Colace twice a daily as needed. Adding fiber few times a day Miralax  daily as needed  No orders of the defined types  were placed in this encounter.    I spent {CHL ONC TIME VISIT - DTPQU:[id number]} counseling the patient face to face. The total time spent in the appointment was {CHL ONC TIME VISIT - DTPQU:[id number]}.  Deavon Podgorski L Jyair Kiraly, PA-C 03/13/24

## 2024-03-15 ENCOUNTER — Inpatient Hospital Stay: Admitting: Physician Assistant

## 2024-03-15 ENCOUNTER — Telehealth: Payer: Self-pay

## 2024-03-15 ENCOUNTER — Inpatient Hospital Stay

## 2024-03-15 ENCOUNTER — Other Ambulatory Visit

## 2024-03-15 ENCOUNTER — Ambulatory Visit

## 2024-03-15 DIAGNOSIS — C679 Malignant neoplasm of bladder, unspecified: Secondary | ICD-10-CM

## 2024-03-15 NOTE — Telephone Encounter (Signed)
 Tried to reach patient and was unable to leave a voicemail because mailbox is full.  Tried to reach daughter, Sherrilyn and left a voicemail in regards to missed appts for today.  Asked for a return call.

## 2024-03-16 ENCOUNTER — Other Ambulatory Visit: Payer: Self-pay | Admitting: Physician Assistant

## 2024-03-16 ENCOUNTER — Other Ambulatory Visit: Payer: Self-pay

## 2024-03-16 DIAGNOSIS — C679 Malignant neoplasm of bladder, unspecified: Secondary | ICD-10-CM

## 2024-03-19 ENCOUNTER — Other Ambulatory Visit: Payer: Self-pay

## 2024-03-19 ENCOUNTER — Emergency Department (HOSPITAL_COMMUNITY)
Admission: EM | Admit: 2024-03-19 | Discharge: 2024-03-19 | Disposition: A | Discharge status: Home / Self Care | Attending: Emergency Medicine | Admitting: Emergency Medicine

## 2024-03-19 DIAGNOSIS — Z77098 Contact with and (suspected) exposure to other hazardous, chiefly nonmedicinal, chemicals: Secondary | ICD-10-CM

## 2024-03-19 NOTE — ED Triage Notes (Signed)
 Pt ambulatory to triage with complaints of itching eyes, runny nose, a foul taste in mouth, and a headache that all began 2 days ago. Pt reports that an exterminator was recently at his residence, and he thinks that may be the cause .   Pt states that he lived in a boarding house.
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
## 2024-03-19 NOTE — Discharge Instructions (Signed)
 If you are worried about your landlord behavior than please ask someone to come evaluate it.

## 2024-03-19 NOTE — Progress Notes (Deleted)
 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  He knows to contact our office if he develops concerns prior to his next appointment.  I provided *** minutes of face-to-face time during this encounter and > 50% was spent counseling as documented under my assessment and plan.    Powell FORBES Lessen, NP  [facility] - A DEPT OF [facility] [address] [place] [address] Dept: [phone] Dept Fax: [phone]   No orders of the defined types were placed in this encounter.     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  He last saw Dr. Tina on 02/16/2024.  Today, cycle 7 day 1 immunotherapy nivolumab .  He denies fevers or chills. He denies pain. His appetite is good. His weight {Weight change:10426}.  I have reviewed the past medical history, past surgical history, social history and family history with the patient and they are unchanged from previous note.  ALLERGIES:  is allergic to clindamycin /lincomycin, milk-related compounds, and hydrocodone.  MEDICATIONS:  Current Outpatient Medications  Medication Sig Dispense Refill   acetaminophen  (TYLENOL ) 500 MG tablet Take 1,000 mg by mouth in the morning, at noon, and at bedtime.     albuterol (PROVENTIL HFA;VENTOLIN HFA) 108 (90 BASE) MCG/ACT inhaler Inhale 1 puff into the lungs every 6 (six) hours as  needed for wheezing or shortness of breath.     aspirin 81 MG chewable tablet Chew 81 mg by mouth daily.     atorvastatin (LIPITOR) 80 MG tablet Take 1 tablet by mouth daily.     bisacodyl (DULCOLAX) 10 MG suppository Place 10 mg rectally daily as needed for moderate constipation.     budesonide-formoterol (SYMBICORT) 160-4.5 MCG/ACT inhaler Inhale 2 puffs into the lungs 2 (two) times daily as needed (for respiratory flares).     cyanocobalamin  (VITAMIN B12) 1000 MCG tablet Take 1 tablet (1,000 mcg total) by mouth daily. 90 tablet 3   diclofenac Sodium (VOLTAREN) 1 % GEL Apply 2 g topically 4 (four) times daily as needed (pain).     docusate sodium  (COLACE) 100 MG capsule Take 1 capsule (100 mg total) by mouth 2 (two) times daily as needed for mild constipation. 60 capsule 1   DULoxetine (CYMBALTA) 60 MG capsule Take 1 capsule by mouth daily.     gabapentin  (NEURONTIN ) 100 MG capsule Take 100 mg by mouth at bedtime. Bedtime dose = 400 mg     gabapentin  (NEURONTIN ) 300 MG capsule Take 300 mg by mouth 3 (three) times daily.     lidocaine  (LIDODERM ) 5 % Place 1 patch onto the skin daily. Remove & Discard patch within 12 hours or as directed by MD     lidocaine -prilocaine  (EMLA ) cream Apply 1 Application topically as needed. 30 g 0   melatonin 3 MG TABS tablet Take 3 mg by mouth at bedtime as needed (sleep).  mirtazapine (REMERON) 7.5 MG tablet Take 7.5 mg by mouth at bedtime.     morphine  (AVINZA ) 30 MG 24 hr capsule Take 30 mg by mouth in the morning and at bedtime.     Multiple Vitamin (MULTIVITAMIN WITH MINERALS) TABS tablet Take 1 tablet by mouth daily.     Oxycodone  HCl 10 MG TABS Take 20 mg by mouth every 4 (four) hours as needed (moderate to severe pain).     pantoprazole  (PROTONIX ) 40 MG tablet Take 1 tablet (40 mg total) by mouth 2 (two) times daily. (Patient taking differently: Take 40 mg by mouth daily.) 180 tablet 0   polyethylene glycol (MIRALAX  / GLYCOLAX ) 17 g packet Take 17 g by  mouth daily. 14 each 0   tiZANidine (ZANAFLEX) 2 MG tablet Take 2 mg by mouth every 6 (six) hours as needed for muscle spasms.     valsartan  (DIOVAN ) 160 MG tablet Take 160 mg by mouth daily.     valsartan -hydrochlorothiazide  (DIOVAN -HCT) 160-12.5 MG tablet Take 1 tablet by mouth daily.     No current facility-administered medications for this visit.    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  2016-2018 He then had multiple other TURBTs with intermittent adherence/tolerability to BCG     05/27/2014 Procedure   Repeat TURBT, path again with LgTa. Only completed 2/6 BCG treatments, then lost to follow-up.    06/09/2015 Procedure   Repeat TURBT with 3 papillary tumors, pathology with LgTa and CIS.    06/2015 Imaging   CT A/P with diffuse bladder wall thickening, no definitive evidence of metastatic disease    07/21/2015 Procedure   Repeat TURBT with path showing high grade dysplasia. Again scheduled for BCG but only completed 1-2 doses.    10/19/2016 Procedure   Repeat TURBT with low grade papillary urothelial carcinoma/CIS. Received 4/6 doses of BCG. Lost to follow-up.    03/09/2019 Imaging   CT CAP with LUL 2cm GGO, minimal right urinary bladder wall thickening of 4mm    01/22/2020 Imaging   CT A/P in the ED for N/V/D/weight loss. Bladder thickening more pronounced at 7mm.    13-Jul-2020 Imaging   CT urogram for hematuria showed new right lateral masslike thickening of bladder wall, measure 8mm. No evidence metastatic disease.    08/13/2020 Procedure   Repeat TURBT with invasive high-grade urothelial carcinoma with nonfocal invasion into lamina propria (CIS present), second specimen with invasive high-grade urothelial carcinoma extensively involving lamina propria, with one fragment  showing involvement of muscle bundles which are equivocal for muscularis mucosae or muscularis propria (CIS again present).    09/19/2020 Procedure   Repeat TURBT for re-resection with invasive high-grade urothelial carcinoma with non-focal lamina propria invasion and CIS, no involvement of muscularis propria    11/20/2020 - 09/17/2021 Chemotherapy   C1 - 8 Pembrolizumab    12/05/2022 Imaging   MRI pelvis wo contrast -Increased size of right posterolateral bladder wall mass, now measuring up to 3.2 cm, previously 1.2 cm, with extramural extension of the mass encasing the distal right ureter and resulting in upstream right ureteral dilation. The extravesicular extension of the bladder mass also is in close contact with the vas deferens near the seminal vesicles for which abutment or invasion cannot be excluded.     01/03/2023 Imaging   CT AP -Increasing  right posterior lateral bladder wall thickening, concerning for disease progression. New right ureteral obstruction associated hydroureteronephrosis.  -Multiple subcentimeter hypoattenuating foci within the liver, favored to be benign given stability since 2016.   CT Chest: No intrathoracic metastasis with stable findings since December 28.     01/17/2023 PET scan   PET from [facility] - Thickening of the right posterolateral bladder wall, similar to prior CT dated 01/03/2023.  - Minimally FDG avid bilateral inguinal lymph nodes which do not appear pathologically enlarged or abnormal in appearance. These are favored to be reactive.  -There is a possible mildly hypermetabolic nodule along the posterior wall of the rectum which is not completely characterized on this study. Suggest correlation with colonoscopy, which patient has scheduled 01/20/2023.    02/07/2023 Pathology Results   A: Ureter margin, right distal, biopsy - Benign ureter with chronic inflammation - Negative for malignancy   B: Ureter margin, left distal, biopsy - Focal atypia,  worrisome for focal involvement by urothelial carcinoma in situ   C: Bladder and prostate, cystoprostatectomy   BLADDER - Multifocal invasive high grade papillary urothelial carcinoma              - Dominant tumor is in right posterior upper bladder wall, 3.0 cm, invasive through muscularis propria with macroscopic invasion of peri-vesicle adipose tissue              - Satellite tumor in left bladder extending into left ureter, 0.9 cm, invasive into lamina propria - Extensive multifocal high grade papillary urothelial carcinoma and urothelial carcinoma in situ present in all random sections of the bladder mucosa (anterior, posterior, dome, trigone, right lateral, left lateral walls) away from invasive cancers - 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 (contiguous with carcinoma in situ of trigone), distal margin uninvolved - Margins of resection negative for invasive carcinoma - Left ureter margin of this specimen shows urothelial carcinoma in situ, superceded by additional specimens, final left ureter margin is in specimen F, which shows patchy atypia 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 - See synoptic report    PROSTATE - 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 and involves prostatic ducts - Prostatic stroma with no evidence of involvement by urothelial carcinoma, negative for prostate carcinoma - Prostatic urethral distal margin negative   D: Lymph node, right pelvic, biopsy - Five lymph nodes with no evidence of malignancy (0/5)    E: Lymph node, left pelvic, biopsy - Metastatic carcinoma present in one of five lymph nodes (1/5), size of metastatic deposit 1.4 mm, without extranodal extension   F: True ureteral margin, left distal, biopsy - 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 - Negative for invasive carcinoma   G: True ureteral margin, right distal, biopsy - Benign ureter with chronic inflammation -  Negative for malignancy  SPECIMEN    Procedure:    Radical cystoprostatectomy   TUMOR    Tumor Site:    Right lateral wall     Tumor Site:    Left lateral wall     Tumor Site:    Posterior wall     Histologic Type:    Urothelial carcinoma, invasive (conventional)     Histologic Grade:    High-grade     Tumor Size:    Greatest Dimension (Centimeters): 3.0 cm       Additional Dimension (Centimeters):    2.9 cm       Additional Dimension (Centimeters):    2.8 cm  Tumor Extent:    Invades perivesical soft tissue microscopically     Lymphatic and / or Vascular Invasion:    Not identified     Tumor Configuration:    Papillary     Tumor Configuration:    Solid / nodule     Tumor Configuration:    Flat     Treatment Effect Post Neoadjuvant Chemotherapy:    Weak and no response: residual cancer cells occupying greater than or equal to 50% of the tumor bed or absence of regressive changes (TRG3)     Tumor Comment:    Appears to have received 1 dose of pembrolizumab, no apparent histologic response.   MARGINS    Margin Status for Invasive Tumor:    All margins negative for invasive tumor       Closest Margin(s) to Invasive tumor:    Soft tissue: right peri-vesicular margin       Distance from Invasive Tumor to Closest Margin:    13 mm     Margin Status for Carcinoma in Situ / Noninvasive Papillary Urothelial Carcinoma:    Carcinoma in situ / noninvasive papillary urothelial carcinoma present at margin       Margin(s) Involved by Carcinoma in Situ / Noninvasive Papillary Urothelial Carcinoma:    Left ureteral   REGIONAL LYMPH NODES     Regional Lymph Node Status:          :    Tumor present in regional lymph node(s)         Number of Lymph Nodes with Tumor:    1         Size of Largest Nodal Metastatic Deposit:    0.14 cm         Nodal Site with Largest Metastatic Deposit:    Left pelvic         Size of Largest Lymph Node with Tumor:    0.7 cm         Largest Lymph Node with Tumor:    left  pelvic         Extranodal Extension (ENE):    Not identified       Number of Lymph Nodes Examined:    10   pTNM CLASSIFICATION (AJCC 8th Edition)     Reporting of pT, pN, and (when applicable) pM categories is based on information available to the pathologist at the time the report is issued. As per the AJCC (Chapter 1, 8th Ed.) it is the managing physician's responsibility to establish the final pathologic stage based upon all pertinent information, including but potentially not limited to this pathology report.     Modified Classification:    y     pT Category:    pT3a     T Suffix:    (m)     pN Category:    pN1    02/20/2023 Procedure   Colonoscopy Impression:    - The examined portion of the ileum was normal.                         - The entire examined colon is normal on direct and retroflexion views.                         - Views were limited by liquid stool throughout the colon and difficulty suctioning the stool.                         -  No specimens collected.    02/23/2023 Imaging   CT AP Hepatobiliary: Hypodensities in liver consistent benign cysts. Postcholecystectomy. Mild extrahepatic biliary duct dilatation not changed from prior.   03/03/2023 -  Chemotherapy   First visit to [facility]. Initially planned for chemoradiation. Due to decrease PS and recurrent hospitalization, did not pursue chemotherapy. Discussed pending PS to consider adjuvant nivolumab  after radiation.     04/06/2023 Imaging   CT AP from [facility] Relative hypoenhancement of the right kidney when compared to the left which is nonspecific. This could be related to altered perfusion or infection/pyelonephritis. No significant hydronephrosis is identified. Clinical correlation is recommended.  LIVER: Normal liver contour. Subcentimeter hypoattenuating hepatic foci too small to characterize further on CT.    05/09/2023 - 06/10/2023 Radiation Therapy   Adjuvant IMRT to bladder 45 Gy/ 25 fxs    05/10/2023 Cancer Staging   Staging form: Urinary Bladder, AJCC 8th Edition - Clinical: Stage IIIA (cT3, cN1, cM0) - Signed by Tina Pauletta BROCKS, MD on 05/10/2023 WHO/ISUP grade (low/high): High Grade Histologic grading system: 2 grade system   05/19/2023 Imaging   Presented to ED with abdominal pain CT AP: IMPRESSION: 1. Surgical resection of the bladder and prostate with ileal conduit present in the right lower quadrant. 2. No evidence of metastatic disease. 3. Mild delay of nephrogram on the right without hydronephrosis, possibly renovascular disease. 4. Aortic atherosclerosis.  CT chest: no metastases   08/23/2023 Imaging   CT AP IMPRESSION: Postsurgical changes as described above.   No acute abnormality is noted   09/12/2023 Imaging   CT chest: NED   10/13/2023 -  Chemotherapy   Patient is on Treatment Plan : BLADDER Nivolumab  (480) q28d         REVIEW OF SYSTEMS:   Constitutional: Denies fevers, chills or abnormal weight loss Eyes: Denies blurriness of vision Ears, nose, mouth, throat, and face: Denies mucositis or sore throat Respiratory: Denies cough, dyspnea or wheezes Cardiovascular: Denies palpitation, chest discomfort or lower extremity swelling Gastrointestinal:  Denies nausea, heartburn or change in bowel habits Skin: Denies abnormal skin rashes Lymphatics: Denies new lymphadenopathy or easy bruising Neurological:Denies numbness, tingling or new weaknesses Behavioral/Psych: Mood is stable, no new changes  All other systems were reviewed with the patient and are negative.   VITALS:  There were no vitals taken for this visit.  Wt Readings from Last 3 Encounters:  02/16/24 192 lb 6.4 oz (87.3 kg)  01/19/24 187 lb 12.8 oz (85.2 kg)  12/22/23 191 lb 6.4 oz (86.8 kg)    There is no height or weight on file to calculate BMI.  Performance status (ECOG): {CHL ONC [id number]  PHYSICAL EXAM:   GENERAL:alert, no distress and comfortable SKIN: skin color,  texture, turgor are normal, no rashes or significant lesions EYES: normal, Conjunctiva are pink and non-injected, sclera clear OROPHARYNX:no exudate, no erythema and lips, buccal mucosa, and tongue normal  NECK: supple, thyroid  normal size, non-tender, without nodularity LYMPH:  no palpable lymphadenopathy in the cervical, axillary or inguinal LUNGS: clear to auscultation and percussion with normal breathing effort HEART: regular rate & rhythm and no murmurs and no lower extremity edema ABDOMEN:abdomen soft, non-tender and normal bowel sounds Musculoskeletal:no cyanosis of digits and no clubbing  NEURO: alert & oriented x 3 with fluent speech, no focal motor/sensory deficits  LABORATORY DATA:  I have reviewed the data as listed    Component Value Date/Time   NA 141 02/16/2024 1340   NA 140 01/29/2014  0937   K 4.4 02/16/2024 1340   K 4.1 01/29/2014 0937   CL 109 02/16/2024 1340   CO2 29 02/16/2024 1340   CO2 26 01/29/2014 0937   GLUCOSE 105 (H) 02/16/2024 1340   GLUCOSE 93 01/29/2014 0937   BUN 26 (H) 02/16/2024 1340   BUN 16.1 01/29/2014 0937   CREATININE 2.05 (H) 02/16/2024 1340   CREATININE 1.0 01/29/2014 0937   CALCIUM 8.6 (L) 02/16/2024 1340   CALCIUM 9.0 01/29/2014 0937   PROT 6.2 (L) 02/16/2024 1340   PROT 6.6 01/29/2014 0937   ALBUMIN 3.6 02/16/2024 1340   ALBUMIN 3.2 (L) 01/29/2014 0937   AST 11 (L) 02/16/2024 1340   AST 27 01/29/2014 0937   ALT 5 02/16/2024 1340   ALT 29 01/29/2014 0937   ALKPHOS 47 02/16/2024 1340   ALKPHOS 86 01/29/2014 0937   BILITOT 0.4 02/16/2024 1340   BILITOT 0.36 01/29/2014 0937   GFRNONAA 35 (L) 02/16/2024 1340   GFRAA 57 (L) 09/20/2014 1352    No results found for: SPEP, UPEP  Lab Results  Component Value Date   WBC 4.0 02/16/2024   NEUTROABS 2.5 02/16/2024   HGB 12.3 (L) 02/16/2024   HCT 38.1 (L) 02/16/2024   MCV 97.4 02/16/2024   PLT 189 02/16/2024      Chemistry      Component Value Date/Time   NA 141 02/16/2024  1340   NA 140 01/29/2014 0937   K 4.4 02/16/2024 1340   K 4.1 01/29/2014 0937   CL 109 02/16/2024 1340   CO2 29 02/16/2024 1340   CO2 26 01/29/2014 0937   BUN 26 (H) 02/16/2024 1340   BUN 16.1 01/29/2014 0937   CREATININE 2.05 (H) 02/16/2024 1340   CREATININE 1.0 01/29/2014 0937      Component Value Date/Time   CALCIUM 8.6 (L) 02/16/2024 1340   CALCIUM 9.0 01/29/2014 0937   ALKPHOS 47 02/16/2024 1340   ALKPHOS 86 01/29/2014 0937   AST 11 (L) 02/16/2024 1340   AST 27 01/29/2014 0937   ALT 5 02/16/2024 1340   ALT 29 01/29/2014 0937   BILITOT 0.4 02/16/2024 1340   BILITOT 0.36 01/29/2014 0937       RADIOGRAPHIC STUDIES: I have personally reviewed the radiological images as listed and agreed with the findings in the report. No results found.

## 2024-03-19 NOTE — Assessment & Plan Note (Deleted)
 Continue treatment this week  return in 4 weeks with lab, visit and then infusion Repeat CT showed NED on 10/31

## 2024-03-19 NOTE — Progress Notes (Signed)
 Treatment date adjusted to 12/9 and then every 4 weeks

## 2024-03-20 ENCOUNTER — Inpatient Hospital Stay

## 2024-03-20 ENCOUNTER — Inpatient Hospital Stay: Admitting: Nurse Practitioner

## 2024-03-20 DIAGNOSIS — C679 Malignant neoplasm of bladder, unspecified: Secondary | ICD-10-CM

## 2024-03-28 ENCOUNTER — Ambulatory Visit: Admitting: Physical Therapy

## 2024-03-30 ENCOUNTER — Telehealth: Payer: Self-pay

## 2024-03-30 ENCOUNTER — Other Ambulatory Visit: Payer: Self-pay

## 2024-03-30 NOTE — Telephone Encounter (Signed)
 Scheduled patient for tx on 12/22, since he missed his tx on 12/9. Called and spoke with the patient, discussed the times of the appointments, he is aware.

## 2024-03-31 ENCOUNTER — Other Ambulatory Visit: Payer: Self-pay

## 2024-04-01 NOTE — Progress Notes (Deleted)
 [facility] OFFICE PROGRESS NOTE  Patient Care Team: [name] LABOR, MD as PCP - General (Family Medicine)   Patient has missed appointment. Assessment & Plan   No orders of the defined types were placed in this encounter.    [name], MD  INTERVAL HISTORY: Patient returns for follow-up.  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  2016-2018 He then had multiple other TURBTs with intermittent adherence/tolerability to BCG     05/27/2014 Procedure   Repeat TURBT, path again with LgTa. Only completed 2/6 BCG treatments, then lost to follow-up.    06/09/2015 Procedure   Repeat TURBT with 3 papillary tumors, pathology with LgTa and CIS.    06/2015 Imaging   CT A/P with diffuse bladder wall thickening, no definitive evidence of metastatic disease    07/21/2015 Procedure   Repeat TURBT with path showing high grade dysplasia. Again scheduled for BCG but only completed 1-2 doses.    10/19/2016 Procedure   Repeat TURBT with low grade papillary urothelial carcinoma/CIS. Received 4/6 doses of BCG. Lost to follow-up.    03/09/2019 Imaging   CT CAP with LUL 2cm GGO, minimal right urinary bladder wall thickening of 4mm    01/22/2020 Imaging   CT A/P in the ED for N/V/D/weight loss. Bladder thickening more pronounced at 7mm.    07-20-2020 Imaging   CT urogram for hematuria showed new right lateral masslike thickening of bladder wall, measure 8mm. No evidence metastatic disease.    08/13/2020 Procedure   Repeat TURBT with invasive high-grade urothelial carcinoma with nonfocal invasion into lamina propria (CIS present), second specimen with invasive high-grade urothelial carcinoma extensively involving lamina propria, with one fragment showing involvement of muscle bundles which are equivocal for  muscularis mucosae or muscularis propria (CIS again present).    09/19/2020 Procedure   Repeat TURBT for re-resection with invasive high-grade urothelial carcinoma with non-focal lamina propria invasion and CIS, no involvement of muscularis propria    11/20/2020 - 09/17/2021 Chemotherapy   C1 - 8 Pembrolizumab    12/05/2022 Imaging   MRI pelvis wo contrast -Increased size of right posterolateral bladder wall mass, now measuring up to 3.2 cm, previously 1.2 cm, with extramural extension of the mass encasing the distal right ureter and resulting in upstream right ureteral dilation. The extravesicular extension of the bladder mass also is in close contact with the vas deferens near the seminal vesicles for which abutment or invasion cannot be excluded.     01/03/2023 Imaging   CT AP -Increasing right posterior lateral bladder wall thickening, concerning for disease progression. New right ureteral obstruction associated hydroureteronephrosis.  -Multiple subcentimeter hypoattenuating foci within the liver, favored to be benign given stability since 2016.   CT Chest: No intrathoracic metastasis with stable findings since December 28.     01/17/2023 PET scan   PET from [facility] - Thickening of the right posterolateral bladder wall, similar to prior CT dated 01/03/2023.  - Minimally FDG avid bilateral inguinal lymph nodes which do not appear pathologically enlarged or abnormal in appearance. These are favored to be reactive.  -There is a possible mildly hypermetabolic nodule along the posterior wall of the rectum which is not completely characterized on this study. Suggest correlation with colonoscopy, which patient has scheduled 01/20/2023.    02/07/2023 Pathology Results   A: Ureter margin, right distal,  biopsy - Benign ureter with chronic inflammation - Negative for malignancy   B: Ureter margin, left distal, biopsy - Focal atypia, worrisome for focal involvement by urothelial carcinoma in situ   C:  Bladder and prostate, cystoprostatectomy   BLADDER - Multifocal invasive high grade papillary urothelial carcinoma              - Dominant tumor is in right posterior upper bladder wall, 3.0 cm, invasive through muscularis propria with macroscopic invasion of peri-vesicle adipose tissue              - Satellite tumor in left bladder extending into left ureter, 0.9 cm, invasive into lamina propria - Extensive multifocal high grade papillary urothelial carcinoma and urothelial carcinoma in situ present in all random sections of the bladder mucosa (anterior, posterior, dome, trigone, right lateral, left lateral walls) away from invasive cancers - 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 (contiguous with carcinoma in situ of trigone), distal margin uninvolved - Margins of resection negative for invasive carcinoma - Left ureter margin of this specimen shows urothelial carcinoma in situ, superceded by additional specimens, final left ureter margin is in specimen F, which shows patchy atypia 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 - See synoptic report    PROSTATE - 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 and involves prostatic ducts - Prostatic stroma with no evidence of involvement by urothelial carcinoma, negative for prostate carcinoma - Prostatic urethral distal margin negative   D: Lymph node, right pelvic, biopsy - Five lymph nodes with no evidence of malignancy (0/5)    E: Lymph node, left pelvic, biopsy - Metastatic carcinoma present in one of five lymph nodes (1/5), size of metastatic deposit 1.4 mm, without extranodal extension   F: True ureteral margin, left distal, biopsy - 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 - Negative for invasive carcinoma   G: True ureteral margin, right distal, biopsy - Benign ureter with chronic inflammation - Negative for malignancy  SPECIMEN    Procedure:    Radical  cystoprostatectomy   TUMOR    Tumor Site:    Right lateral wall     Tumor Site:    Left lateral wall     Tumor Site:    Posterior wall     Histologic Type:    Urothelial carcinoma, invasive (conventional)     Histologic Grade:    High-grade     Tumor Size:    Greatest Dimension (Centimeters): 3.0 cm       Additional Dimension (Centimeters):    2.9 cm       Additional Dimension (Centimeters):    2.8 cm     Tumor Extent:    Invades perivesical soft tissue microscopically     Lymphatic and / or Vascular Invasion:    Not identified     Tumor Configuration:    Papillary     Tumor Configuration:    Solid / nodule     Tumor Configuration:    Flat     Treatment Effect Post Neoadjuvant Chemotherapy:    Weak and no response: residual cancer cells occupying greater than or equal to 50% of the tumor bed or absence of regressive changes (TRG3)     Tumor Comment:    Appears to have received 1 dose of pembrolizumab, no apparent histologic response.   MARGINS    Margin Status for Invasive Tumor:    All margins negative for invasive tumor  Closest Margin(s) to Invasive tumor:    Soft tissue: right peri-vesicular margin       Distance from Invasive Tumor to Closest Margin:    13 mm     Margin Status for Carcinoma in Situ / Noninvasive Papillary Urothelial Carcinoma:    Carcinoma in situ / noninvasive papillary urothelial carcinoma present at margin       Margin(s) Involved by Carcinoma in Situ / Noninvasive Papillary Urothelial Carcinoma:    Left ureteral   REGIONAL LYMPH NODES     Regional Lymph Node Status:          :    Tumor present in regional lymph node(s)         Number of Lymph Nodes with Tumor:    1         Size of Largest Nodal Metastatic Deposit:    0.14 cm         Nodal Site with Largest Metastatic Deposit:    Left pelvic         Size of Largest Lymph Node with Tumor:    0.7 cm         Largest Lymph Node with Tumor:    left pelvic         Extranodal Extension (ENE):    Not identified        Number of Lymph Nodes Examined:    10   pTNM CLASSIFICATION (AJCC 8th Edition)     Reporting of pT, pN, and (when applicable) pM categories is based on information available to the pathologist at the time the report is issued. As per the AJCC (Chapter 1, 8th Ed.) it is the managing physicians responsibility to establish the final pathologic stage based upon all pertinent information, including but potentially not limited to this pathology report.     Modified Classification:    y     pT Category:    pT3a     T Suffix:    (m)     pN Category:    pN1    02/20/2023 Procedure   Colonoscopy Impression:    - The examined portion of the ileum was normal.                         - The entire examined colon is normal on direct and retroflexion views.                         - Views were limited by liquid stool throughout the colon and difficulty suctioning the stool.                         - No specimens collected.    02/23/2023 Imaging   CT AP Hepatobiliary: Hypodensities in liver consistent benign cysts. Postcholecystectomy. Mild extrahepatic biliary duct dilatation not changed from prior.   03/03/2023 -  Chemotherapy   First visit to [facility]. Initially planned for chemoradiation. Due to decrease PS and recurrent hospitalization, did not pursue chemotherapy. Discussed pending PS to consider adjuvant nivolumab  after radiation.     04/06/2023 Imaging   CT AP from [name] Relative hypoenhancement of the right kidney when compared to the left which is nonspecific. This could be related to altered perfusion or infection/pyelonephritis. No significant hydronephrosis is identified. Clinical correlation is recommended.  LIVER: Normal liver contour. Subcentimeter hypoattenuating hepatic foci too small to characterize further on CT.  05/09/2023 - 06/10/2023 Radiation Therapy   Adjuvant IMRT to bladder 45 Gy/ 25 fxs   05/10/2023 Cancer Staging   Staging form: Urinary Bladder, AJCC 8th  Edition - Clinical: Stage IIIA (cT3, cN1, cM0) - Signed by Tina Pauletta BROCKS, MD on 05/10/2023 WHO/ISUP grade (low/high): High Grade Histologic grading system: 2 grade system   05/19/2023 Imaging   Presented to ED with abdominal pain CT AP: IMPRESSION: 1. Surgical resection of the bladder and prostate with ileal conduit present in the right lower quadrant. 2. No evidence of metastatic disease. 3. Mild delay of nephrogram on the right without hydronephrosis, possibly renovascular disease. 4. Aortic atherosclerosis.  CT chest: no metastases   08/23/2023 Imaging   CT AP IMPRESSION: Postsurgical changes as described above.   No acute abnormality is noted   09/12/2023 Imaging   CT chest: NED   10/13/2023 -  Chemotherapy   Patient is on Treatment Plan : BLADDER Nivolumab  ([phone] q28d        PHYSICAL EXAMINATION: ECOG PERFORMANCE STATUS: {CHL ONC ECOG [phone]}  There were no vitals filed for this visit. There were no vitals filed for this visit.  GENERAL: alert, no distress and comfortable SKIN: skin color normal and no jaundice or bruising or petechiae on exposed skin EYES: normal, sclera clear OROPHARYNX: no exudate  NECK: No palpable mass LYMPH:  no palpable cervical, axillary lymphadenopathy  LUNGS: clear to auscultation and no wheeze or rales with normal breathing effort HEART: regular rate & rhythm  ABDOMEN: abdomen soft, non-tender and nondistended. Musculoskeletal: no edema NEURO: no focal motor/sensory deficits  Relevant data reviewed during this visit included labs.  New labs ordered.

## 2024-04-02 ENCOUNTER — Inpatient Hospital Stay

## 2024-04-04 ENCOUNTER — Other Ambulatory Visit: Payer: Self-pay

## 2024-04-18 ENCOUNTER — Telehealth: Payer: Self-pay

## 2024-04-18 ENCOUNTER — Other Ambulatory Visit: Payer: Self-pay

## 2024-04-18 NOTE — Telephone Encounter (Signed)
 Rescheduled patients missed treatment appointments from 12/22. I called to let the patient know but he did not answer and his voicemail box is full. I am mailing the appointment reminder to him.

## 2024-04-19 ENCOUNTER — Other Ambulatory Visit: Payer: Self-pay

## 2024-04-20 ENCOUNTER — Other Ambulatory Visit: Payer: Self-pay | Admitting: *Deleted

## 2024-04-20 DIAGNOSIS — C679 Malignant neoplasm of bladder, unspecified: Secondary | ICD-10-CM

## 2024-04-23 ENCOUNTER — Telehealth: Payer: Self-pay

## 2024-04-23 NOTE — Progress Notes (Unsigned)
 [facility] OFFICE PROGRESS NOTE  Patient Care Team: Todd [name] LABOR, MD as PCP - General (Family Medicine)  Todd [name] is a 69 y.o.male with history of colon cancer, MIBC, MLH1 mutation, COPD, back fusion with chronic back pain being follow up with for urothelial carcinoma.    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 After improvement of PS, started adjuvant nivolumab  on 10/13/23   CT showed NED on 10/31.   He has not follow-up since November.  We had called many times on his missed appointment.   Clinically stable without any new side effects.  Kidney function improved.  Discussed increase fluid intake.  We will obtain restaging imaging after next cycle.  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  CT for about 3/2.  We will discuss result when he returned on 3/10. Stage 3b chronic kidney disease (HCC) Had advised to increased fluid goal 60-70 oz per day. He has not followed up and missed appointments  Monitor lab each visit for potential irAE History of CVA in adulthood Referral to neurology Patient is on aspirin at baseline and atorvastatin. Imbalance Referral to physical therapy.  Orders Placed This Encounter  Procedures   CT CHEST ABDOMEN PELVIS W CONTRAST    Standing Status:   Future    Expected Date:   06/11/2024    Expiration Date:   04/24/2025    If indicated for the ordered procedure, I authorize the administration of contrast media per Radiology protocol:   Yes    Does the patient have a contrast media/X-ray dye allergy?:   No     Preferred imaging location?:   [facility]    If indicated for the ordered procedure, I authorize the administration of oral contrast media per Radiology protocol:   Yes   Ambulatory referral to Neurology    Referral Priority:   Routine    Referral Type:   Consultation    Referral Reason:   Specialty Services Required    Requested Specialty:   Neurology    Number of Visits Requested:   1     Todd [name], MD  INTERVAL HISTORY: Patient returns for follow-up.  Patient has not follow-up last month.  Reports that he is not happy with his ostomy.  Denies any coughing, shortness of breath.  Reports urine was cloudy couple times.  Currently is clear.  Reports some abdominal pain as before above the ostomy.  There is no fever or redness.  There is no discharge.  No rash, diarrhea.  Reported imbalance sometimes.  He had fallen last month.  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  2016-2018 He then had multiple other TURBTs with intermittent adherence/tolerability to BCG     05/27/2014 Procedure   Repeat TURBT, path again with LgTa. Only completed 2/6 BCG treatments, then lost to follow-up.    06/09/2015 Procedure   Repeat TURBT with 3 papillary tumors, pathology with LgTa and CIS.    06/2015 Imaging   CT A/P  with diffuse bladder wall thickening, no definitive evidence of metastatic disease    07/21/2015 Procedure   Repeat TURBT with path showing high grade dysplasia. Again scheduled for BCG but only completed 1-2 doses.    10/19/2016 Procedure   Repeat TURBT with low grade papillary urothelial carcinoma/CIS. Received 4/6 doses of BCG. Lost to follow-up.    03/09/2019 Imaging   CT CAP with LUL 2cm GGO, minimal right urinary bladder wall thickening of 4mm    01/22/2020 Imaging   CT A/P in the ED for N/V/D/weight  loss. Bladder thickening more pronounced at 7mm.    07/21/2020 Imaging   CT urogram for hematuria showed new right lateral masslike thickening of bladder wall, measure 8mm. No evidence metastatic disease.    08/13/2020 Procedure   Repeat TURBT with invasive high-grade urothelial carcinoma with nonfocal invasion into lamina propria (CIS present), second specimen with invasive high-grade urothelial carcinoma extensively involving lamina propria, with one fragment showing involvement of muscle bundles which are equivocal for muscularis mucosae or muscularis propria (CIS again present).    09/19/2020 Procedure   Repeat TURBT for re-resection with invasive high-grade urothelial carcinoma with non-focal lamina propria invasion and CIS, no involvement of muscularis propria    11/20/2020 - 09/17/2021 Chemotherapy   C1 - 8 Pembrolizumab    12/05/2022 Imaging   MRI pelvis wo contrast -Increased size of right posterolateral bladder wall mass, now measuring up to 3.2 cm, previously 1.2 cm, with extramural extension of the mass encasing the distal right ureter and resulting in upstream right ureteral dilation. The extravesicular extension of the bladder mass also is in close contact with the vas deferens near the seminal vesicles for which abutment or invasion cannot be excluded.     01/03/2023 Imaging   CT AP -Increasing right posterior lateral bladder wall thickening, concerning for disease progression. New right ureteral obstruction associated hydroureteronephrosis.  -Multiple subcentimeter hypoattenuating foci within the liver, favored to be benign given stability since 2016.   CT Chest: No intrathoracic metastasis with stable findings since December 28.     01/17/2023 PET scan   PET from [facility] - Thickening of the right posterolateral bladder wall, similar to prior CT dated 01/03/2023.  - Minimally FDG avid bilateral inguinal lymph nodes which do not appear pathologically enlarged or abnormal in appearance.  These are favored to be reactive.  -There is a possible mildly hypermetabolic nodule along the posterior wall of the rectum which is not completely characterized on this study. Suggest correlation with colonoscopy, which patient has scheduled 01/20/2023.    02/07/2023 Pathology Results   A: Ureter margin, right distal, biopsy - Benign ureter with chronic inflammation - Negative for malignancy   B: Ureter margin, left distal, biopsy - Focal atypia, worrisome for focal involvement by urothelial carcinoma in situ   C: Bladder and prostate, cystoprostatectomy   BLADDER - Multifocal invasive high grade papillary urothelial carcinoma              - Dominant tumor is in right posterior upper bladder wall, 3.0 cm, invasive through muscularis propria with macroscopic invasion of peri-vesicle adipose tissue              - Satellite tumor in left bladder extending into left ureter, 0.9 cm, invasive into lamina propria - Extensive multifocal high grade papillary urothelial carcinoma and urothelial carcinoma in situ present in all random sections of the bladder mucosa (anterior, posterior, dome, trigone, right lateral, left lateral walls) away from invasive cancers - Urothelial carcinoma  in situ involves prostatic urethra (contiguous with carcinoma in situ of trigone), distal margin uninvolved - Margins of resection negative for invasive carcinoma - Left ureter margin of this specimen shows urothelial carcinoma in situ, superceded by additional specimens, final left ureter margin is in specimen F, which shows patchy atypia 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 - See synoptic report    PROSTATE - 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 and involves prostatic ducts - Prostatic stroma with no evidence of involvement by urothelial carcinoma, negative for prostate carcinoma - Prostatic urethral distal margin negative   D: Lymph node, right pelvic, biopsy - Five lymph nodes with no evidence of  malignancy (0/5)    E: Lymph node, left pelvic, biopsy - Metastatic carcinoma present in one of five lymph nodes (1/5), size of metastatic deposit 1.4 mm, without extranodal extension   F: True ureteral margin, left distal, biopsy - 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 - Negative for invasive carcinoma   G: True ureteral margin, right distal, biopsy - Benign ureter with chronic inflammation - Negative for malignancy  SPECIMEN    Procedure:    Radical cystoprostatectomy   TUMOR    Tumor Site:    Right lateral wall     Tumor Site:    Left lateral wall     Tumor Site:    Posterior wall     Histologic Type:    Urothelial carcinoma, invasive (conventional)     Histologic Grade:    High-grade     Tumor Size:    Greatest Dimension (Centimeters): 3.0 cm       Additional Dimension (Centimeters):    2.9 cm       Additional Dimension (Centimeters):    2.8 cm     Tumor Extent:    Invades perivesical soft tissue microscopically     Lymphatic and / or Vascular Invasion:    Not identified     Tumor Configuration:    Papillary     Tumor Configuration:    Solid / nodule     Tumor Configuration:    Flat     Treatment Effect Post Neoadjuvant Chemotherapy:    Weak and no response: residual cancer cells occupying greater than or equal to 50% of the tumor bed or absence of regressive changes (TRG3)     Tumor Comment:    Appears to have received 1 dose of pembrolizumab, no apparent histologic response.   MARGINS    Margin Status for Invasive Tumor:    All margins negative for invasive tumor       Closest Margin(s) to Invasive tumor:    Soft tissue: right peri-vesicular margin       Distance from Invasive Tumor to Closest Margin:    13 mm     Margin Status for Carcinoma in Situ / Noninvasive Papillary Urothelial Carcinoma:    Carcinoma in situ / noninvasive papillary urothelial carcinoma present at margin       Margin(s) Involved by Carcinoma in  Situ / Noninvasive Papillary Urothelial Carcinoma:    Left ureteral   REGIONAL LYMPH NODES     Regional Lymph Node Status:          :    Tumor present in regional lymph node(s)         Number of Lymph Nodes with Tumor:    1         Size of Largest Nodal Metastatic Deposit:    0.14  cm         Nodal Site with Largest Metastatic Deposit:    Left pelvic         Size of Largest Lymph Node with Tumor:    0.7 cm         Largest Lymph Node with Tumor:    left pelvic         Extranodal Extension (ENE):    Not identified       Number of Lymph Nodes Examined:    10   pTNM CLASSIFICATION (AJCC 8th Edition)     Reporting of pT, pN, and (when applicable) pM categories is based on information available to the pathologist at the time the report is issued. As per the AJCC (Chapter 1, 8th Ed.) it is the managing physicians responsibility to establish the final pathologic stage based upon all pertinent information, including but potentially not limited to this pathology report.     Modified Classification:    y     pT Category:    pT3a     T Suffix:    (m)     pN Category:    pN1    02/20/2023 Procedure   Colonoscopy Impression:    - The examined portion of the ileum was normal.                         - The entire examined colon is normal on direct and retroflexion views.                         - Views were limited by liquid stool throughout the colon and difficulty suctioning the stool.                         - No specimens collected.    02/23/2023 Imaging   CT AP Hepatobiliary: Hypodensities in liver consistent benign cysts. Postcholecystectomy. Mild extrahepatic biliary duct dilatation not changed from prior.   03/03/2023 -  Chemotherapy   First visit to [facility]. Initially planned for chemoradiation. Due to decrease PS and recurrent hospitalization, did not pursue chemotherapy. Discussed pending PS to consider adjuvant nivolumab  after radiation.     04/06/2023 Imaging   CT AP from  [facility] Relative hypoenhancement of the right kidney when compared to the left which is nonspecific. This could be related to altered perfusion or infection/pyelonephritis. No significant hydronephrosis is identified. Clinical correlation is recommended.  LIVER: Normal liver contour. Subcentimeter hypoattenuating hepatic foci too small to characterize further on CT.    05/09/2023 - 06/10/2023 Radiation Therapy   Adjuvant IMRT to bladder 45 Gy/ 25 fxs   05/10/2023 Cancer Staging   Staging form: Urinary Bladder, AJCC 8th Edition - Clinical: Stage IIIA (cT3, cN1, cM0) - Signed by Tina Todd BROCKS, MD on 05/10/2023 WHO/ISUP grade (low/high): High Grade Histologic grading system: 2 grade system   05/19/2023 Imaging   Presented to ED with abdominal pain CT AP: IMPRESSION: 1. Surgical resection of the bladder and prostate with ileal conduit present in the right lower quadrant. 2. No evidence of metastatic disease. 3. Mild delay of nephrogram on the right without hydronephrosis, possibly renovascular disease. 4. Aortic atherosclerosis.  CT chest: no metastases   08/23/2023 Imaging   CT AP IMPRESSION: Postsurgical changes as described above.   No acute abnormality is noted   09/12/2023 Imaging   CT chest: NED   10/13/2023 -  Chemotherapy   Patient is on Treatment Plan : BLADDER Nivolumab  (480) q28d        PHYSICAL EXAMINATION: ECOG PERFORMANCE STATUS: 1  Vitals:   04/24/24 1353  BP: 139/73  Pulse: 88  Resp: 18  Temp: 97.9 F (36.6 C)  SpO2: 100%   Filed Weights   04/24/24 1353  Weight: 185 lb (83.9 kg)    GENERAL: alert, no distress and comfortable using a walker LUNGS: clear to auscultation and no wheeze or rales with normal breathing effort HEART: regular rate & rhythm  ABDOMEN: abdomen soft, non-tender and nondistended. Ostomy site without any erythema. Musculoskeletal: no edema   Relevant data reviewed during this visit included labs.  New labs ordered.

## 2024-04-23 NOTE — Telephone Encounter (Signed)
 Patient is requesting the following refill Requested Prescriptions   Pending Prescriptions Disp Refills   oxyCODONE  (ROXICODONE ) 10 mg immediate release tablet 120 tablet 0    Sig: Take 1 tablet (10 mg total) by mouth every six (6) hours as needed for pain.   morphine  (MS CONTIN ) 15 MG 12 hr tablet 60 tablet 0    Sig: Take 1 tablet (15 mg total) by mouth two (2) times a day.    Morphine  Last refill given on: 03/23/24 with 60 count and 0 refills.   Recent Visits Date Type Provider Dept  02/20/24 Office Visit [name] LABOR, MD [facility]  11/21/23 Office Visit [name] LABOR, MD [facility]  10/24/23 Office Visit [name] LABOR, MD [facility]  06/13/23 Office Visit [name] LABOR, MD [facility]  05/23/23 Telemedicine Betti, Asberry LABOR, MD [facility]  Showing recent visits within past 365 days and meeting all other requirements Future Appointments Date Type Provider Dept  04/27/24 Appointment [name] LABOR, MD [facility]  Showing future appointments within next 365 days and meeting all other requirements    Opioid Monitoring  Urine Tox Screen Last Drug Screen Date: 06/13/2023 Opiate Confirmation Test Last Drug Screen Date: 06/15/2023 Last Opioid Dispensed Provider: Asberry LABOR Betti, MD Prescribed MEDD : 90 Last PDMP Review: 03/23/2024  3:38 PM Last Opioid Pain Agreement Signed Date: 10/25/2023 Last Non Opioid Controlled Substance Pain Agreement Signed Date: Not Found  Naloxone  Ordered: 03/27/2024 Last OV: 03/26/2024

## 2024-04-23 NOTE — Telephone Encounter (Signed)
 Called patient to let him know of his appointments tomorrow since I could not reach him last week. He gave me a verbal confirmation saying he will be here and that he needs transportation and gave me an address that is not listed in his chart. I updated the address and sent a message to transportation to reach out to him.

## 2024-04-24 ENCOUNTER — Inpatient Hospital Stay

## 2024-04-24 ENCOUNTER — Other Ambulatory Visit: Payer: Self-pay

## 2024-04-24 VITALS — BP 155/85 | HR 82 | Temp 98.5°F | Resp 18

## 2024-04-24 VITALS — BP 139/73 | HR 88 | Temp 97.9°F | Resp 18 | Ht 70.0 in | Wt 185.0 lb

## 2024-04-24 DIAGNOSIS — Z5112 Encounter for antineoplastic immunotherapy: Secondary | ICD-10-CM | POA: Insufficient documentation

## 2024-04-24 DIAGNOSIS — R2689 Other abnormalities of gait and mobility: Secondary | ICD-10-CM

## 2024-04-24 DIAGNOSIS — C678 Malignant neoplasm of overlapping sites of bladder: Secondary | ICD-10-CM | POA: Insufficient documentation

## 2024-04-24 DIAGNOSIS — C775 Secondary and unspecified malignant neoplasm of intrapelvic lymph nodes: Secondary | ICD-10-CM | POA: Diagnosis not present

## 2024-04-24 DIAGNOSIS — C7989 Secondary malignant neoplasm of other specified sites: Secondary | ICD-10-CM | POA: Insufficient documentation

## 2024-04-24 DIAGNOSIS — N1832 Chronic kidney disease, stage 3b: Secondary | ICD-10-CM | POA: Diagnosis not present

## 2024-04-24 DIAGNOSIS — C679 Malignant neoplasm of bladder, unspecified: Secondary | ICD-10-CM

## 2024-04-24 DIAGNOSIS — Z8673 Personal history of transient ischemic attack (TIA), and cerebral infarction without residual deficits: Secondary | ICD-10-CM

## 2024-04-24 LAB — CBC WITH DIFFERENTIAL (CANCER CENTER ONLY)
Abs Immature Granulocytes: 0.01 K/uL (ref 0.00–0.07)
Basophils Absolute: 0 K/uL (ref 0.0–0.1)
Basophils Relative: 0 %
Eosinophils Absolute: 0.2 K/uL (ref 0.0–0.5)
Eosinophils Relative: 4 %
HCT: 35.6 % — ABNORMAL LOW (ref 39.0–52.0)
Hemoglobin: 12.1 g/dL — ABNORMAL LOW (ref 13.0–17.0)
Immature Granulocytes: 0 %
Lymphocytes Relative: 17 %
Lymphs Abs: 0.8 K/uL (ref 0.7–4.0)
MCH: 32.2 pg (ref 26.0–34.0)
MCHC: 34 g/dL (ref 30.0–36.0)
MCV: 94.7 fL (ref 80.0–100.0)
Monocytes Absolute: 0.4 K/uL (ref 0.1–1.0)
Monocytes Relative: 8 %
Neutro Abs: 3.3 K/uL (ref 1.7–7.7)
Neutrophils Relative %: 71 %
Platelet Count: 224 K/uL (ref 150–400)
RBC: 3.76 MIL/uL — ABNORMAL LOW (ref 4.22–5.81)
RDW: 13.2 % (ref 11.5–15.5)
WBC Count: 4.7 K/uL (ref 4.0–10.5)
nRBC: 0 % (ref 0.0–0.2)

## 2024-04-24 LAB — CMP (CANCER CENTER ONLY)
ALT: 5 U/L (ref 0–44)
AST: 16 U/L (ref 15–41)
Albumin: 3.7 g/dL (ref 3.5–5.0)
Alkaline Phosphatase: 59 U/L (ref 38–126)
Anion gap: 9 (ref 5–15)
BUN: 13 mg/dL (ref 8–23)
CO2: 26 mmol/L (ref 22–32)
Calcium: 8.8 mg/dL — ABNORMAL LOW (ref 8.9–10.3)
Chloride: 108 mmol/L (ref 98–111)
Creatinine: 1.6 mg/dL — ABNORMAL HIGH (ref 0.61–1.24)
GFR, Estimated: 47 mL/min — ABNORMAL LOW
Glucose, Bld: 122 mg/dL — ABNORMAL HIGH (ref 70–99)
Potassium: 3.8 mmol/L (ref 3.5–5.1)
Sodium: 142 mmol/L (ref 135–145)
Total Bilirubin: 0.4 mg/dL (ref 0.0–1.2)
Total Protein: 6.7 g/dL (ref 6.5–8.1)

## 2024-04-24 LAB — T4, FREE: Free T4: 1.25 ng/dL (ref 0.80–2.00)

## 2024-04-24 MED ORDER — SODIUM CHLORIDE 0.9 % IV SOLN: INTRAVENOUS | Status: DC

## 2024-04-24 MED ORDER — SODIUM CHLORIDE 0.9 % IV SOLN
480.0000 mg | Freq: Once | INTRAVENOUS | Status: AC
  Administered 2024-04-24: 480 mg via INTRAVENOUS
  Filled 2024-04-24: qty 48

## 2024-04-24 NOTE — Assessment & Plan Note (Addendum)
 Continue treatment this week  Patient has missed several times with repeated phone calls return in 4 weeks for with lab, visit with APP and then infusion Repeat CT showed NED on 10/31.  CT for about 3/2.  We will discuss result when he returned on 3/10.

## 2024-04-24 NOTE — Patient Instructions (Signed)
 [facility] - A DEPT OF [facility]  Discharge Instructions: Thank you for choosing [facility] to provide your oncology and hematology care.   If you have a lab appointment with the Cancer Center, please go directly to the Cancer Center and check in at the registration area.   Wear comfortable clothing and clothing appropriate for easy access to any Portacath or PICC line.   We strive to give you quality time with your provider. You may need to reschedule your appointment if you arrive late (15 or more minutes).  Arriving late affects you and other patients whose appointments are after yours.  Also, if you miss three or more appointments without notifying the office, you may be dismissed from the clinic at the provider's discretion.      For prescription refill requests, have your pharmacy contact our office and allow 72 hours for refills to be completed.    Today you received the following chemotherapy and/or immunotherapy agents: Nivolumab.       To help prevent nausea and vomiting after your treatment, we encourage you to take your nausea medication as directed.  BELOW ARE SYMPTOMS THAT SHOULD BE REPORTED IMMEDIATELY: *FEVER GREATER THAN 100.4 F (38 C) OR HIGHER *CHILLS OR SWEATING *NAUSEA AND VOMITING THAT IS NOT CONTROLLED WITH YOUR NAUSEA MEDICATION *UNUSUAL SHORTNESS OF BREATH *UNUSUAL BRUISING OR BLEEDING *URINARY PROBLEMS (pain or burning when urinating, or frequent urination) *BOWEL PROBLEMS (unusual diarrhea, constipation, pain near the anus) TENDERNESS IN MOUTH AND THROAT WITH OR WITHOUT PRESENCE OF ULCERS (sore throat, sores in mouth, or a toothache) UNUSUAL RASH, SWELLING OR PAIN  UNUSUAL VAGINAL DISCHARGE OR ITCHING   Items with * indicate a potential emergency and should be followed up as soon as possible or go to the Emergency Department if any problems should occur.  Please show the CHEMOTHERAPY ALERT CARD or  IMMUNOTHERAPY ALERT CARD at check-in to the Emergency Department and triage nurse.  Should you have questions after your visit or need to cancel or reschedule your appointment, please contact [facility] - A DEPT OF [facility]  Dept: [phone]  and follow the prompts.  Office hours are 8:00 a.m. to 4:30 p.m. Monday - Friday. Please note that voicemails left after 4:00 p.m. may not be returned until the following business day.  We are closed weekends and major holidays. You have access to a nurse at all times for urgent questions. Please call the main number to the clinic Dept: [phone] and follow the prompts.   For any non-urgent questions, you may also contact your provider using MyChart. We now offer e-Visits for anyone 21 and older to request care online for non-urgent symptoms. For details visit [email or web address].   Also download the MyChart app! Go to the app store, search "MyChart", open the app, select [place], and log in with your MyChart username and password.

## 2024-04-24 NOTE — Progress Notes (Signed)
 Opdivo  auth pending. Okay to proceed with treatment today per Darlena Clark.  Harlene Nasuti, PharmD Oncology Infusion Pharmacist 04/24/2024 12:27 PM

## 2024-04-24 NOTE — Assessment & Plan Note (Addendum)
 Had advised to increased fluid goal 60-70 oz per day. He has not followed up and missed appointments  Monitor lab each visit for potential irAE

## 2024-04-24 NOTE — Assessment & Plan Note (Addendum)
 Referral to physical therapy

## 2024-04-25 ENCOUNTER — Other Ambulatory Visit: Payer: Self-pay

## 2024-04-26 ENCOUNTER — Other Ambulatory Visit: Payer: Self-pay

## 2024-05-17 ENCOUNTER — Ambulatory Visit (HOSPITAL_BASED_OUTPATIENT_CLINIC_OR_DEPARTMENT_OTHER): Admitting: Physical Therapy

## 2024-05-22 ENCOUNTER — Inpatient Hospital Stay: Admitting: Physician Assistant

## 2024-05-22 ENCOUNTER — Inpatient Hospital Stay

## 2024-05-22 ENCOUNTER — Inpatient Hospital Stay: Admitting: Nurse Practitioner

## 2024-05-23 ENCOUNTER — Encounter (HOSPITAL_BASED_OUTPATIENT_CLINIC_OR_DEPARTMENT_OTHER): Payer: Self-pay | Admitting: Physical Therapy

## 2024-05-30 ENCOUNTER — Encounter (HOSPITAL_BASED_OUTPATIENT_CLINIC_OR_DEPARTMENT_OTHER): Payer: Self-pay | Admitting: Physical Therapy
